# Patient Record
Sex: Female | Born: 1935 | Race: White | Hispanic: No | Smoking: Former smoker
Health system: Southern US, Community
[De-identification: ages and names within clinical notes are randomized; demographics above are authoritative.]

## PROBLEM LIST (undated history)

## (undated) DIAGNOSIS — I10 Essential (primary) hypertension: Secondary | ICD-10-CM

## (undated) DIAGNOSIS — M353 Polymyalgia rheumatica: Secondary | ICD-10-CM

## (undated) DIAGNOSIS — E079 Disorder of thyroid, unspecified: Secondary | ICD-10-CM

## (undated) DIAGNOSIS — Z8742 Personal history of other diseases of the female genital tract: Secondary | ICD-10-CM

## (undated) DIAGNOSIS — S065X9A Traumatic subdural hemorrhage with loss of consciousness of unspecified duration, initial encounter: Secondary | ICD-10-CM

## (undated) DIAGNOSIS — N189 Chronic kidney disease, unspecified: Secondary | ICD-10-CM

## (undated) DIAGNOSIS — K56609 Unspecified intestinal obstruction, unspecified as to partial versus complete obstruction: Secondary | ICD-10-CM

## (undated) DIAGNOSIS — H9190 Unspecified hearing loss, unspecified ear: Secondary | ICD-10-CM

## (undated) DIAGNOSIS — F329 Major depressive disorder, single episode, unspecified: Secondary | ICD-10-CM

## (undated) DIAGNOSIS — A048 Other specified bacterial intestinal infections: Secondary | ICD-10-CM

## (undated) DIAGNOSIS — H5440 Blindness, one eye, unspecified eye: Secondary | ICD-10-CM

## (undated) DIAGNOSIS — Z8711 Personal history of peptic ulcer disease: Secondary | ICD-10-CM

## (undated) DIAGNOSIS — N2889 Other specified disorders of kidney and ureter: Secondary | ICD-10-CM

## (undated) DIAGNOSIS — I779 Disorder of arteries and arterioles, unspecified: Secondary | ICD-10-CM

## (undated) DIAGNOSIS — E785 Hyperlipidemia, unspecified: Secondary | ICD-10-CM

## (undated) DIAGNOSIS — I739 Peripheral vascular disease, unspecified: Secondary | ICD-10-CM

## (undated) DIAGNOSIS — Z972 Presence of dental prosthetic device (complete) (partial): Secondary | ICD-10-CM

## (undated) DIAGNOSIS — F607 Dependent personality disorder: Secondary | ICD-10-CM

## (undated) DIAGNOSIS — IMO0002 Reserved for concepts with insufficient information to code with codable children: Secondary | ICD-10-CM

## (undated) DIAGNOSIS — F32A Depression, unspecified: Secondary | ICD-10-CM

## (undated) DIAGNOSIS — K469 Unspecified abdominal hernia without obstruction or gangrene: Secondary | ICD-10-CM

## (undated) DIAGNOSIS — K219 Gastro-esophageal reflux disease without esophagitis: Secondary | ICD-10-CM

## (undated) DIAGNOSIS — D649 Anemia, unspecified: Secondary | ICD-10-CM

## (undated) DIAGNOSIS — N319 Neuromuscular dysfunction of bladder, unspecified: Secondary | ICD-10-CM

## (undated) DIAGNOSIS — J449 Chronic obstructive pulmonary disease, unspecified: Secondary | ICD-10-CM

## (undated) DIAGNOSIS — K317 Polyp of stomach and duodenum: Secondary | ICD-10-CM

## (undated) DIAGNOSIS — K08109 Complete loss of teeth, unspecified cause, unspecified class: Secondary | ICD-10-CM

## (undated) DIAGNOSIS — S065XAA Traumatic subdural hemorrhage with loss of consciousness status unknown, initial encounter: Secondary | ICD-10-CM

## (undated) DIAGNOSIS — K635 Polyp of colon: Secondary | ICD-10-CM

## (undated) DIAGNOSIS — Z973 Presence of spectacles and contact lenses: Secondary | ICD-10-CM

## (undated) DIAGNOSIS — G894 Chronic pain syndrome: Secondary | ICD-10-CM

## (undated) DIAGNOSIS — K760 Fatty (change of) liver, not elsewhere classified: Secondary | ICD-10-CM

## (undated) DIAGNOSIS — K8050 Calculus of bile duct without cholangitis or cholecystitis without obstruction: Secondary | ICD-10-CM

## (undated) DIAGNOSIS — Z8679 Personal history of other diseases of the circulatory system: Secondary | ICD-10-CM

## (undated) DIAGNOSIS — Z8719 Personal history of other diseases of the digestive system: Secondary | ICD-10-CM

## (undated) DIAGNOSIS — I4891 Unspecified atrial fibrillation: Secondary | ICD-10-CM

## (undated) DIAGNOSIS — E669 Obesity, unspecified: Secondary | ICD-10-CM

## (undated) DIAGNOSIS — G43909 Migraine, unspecified, not intractable, without status migrainosus: Secondary | ICD-10-CM

## (undated) HISTORY — DX: Personal history of other diseases of the circulatory system: Z86.79

## (undated) HISTORY — DX: Dependent personality disorder: F60.7

## (undated) HISTORY — DX: Chronic obstructive pulmonary disease, unspecified: J44.9

## (undated) HISTORY — DX: Other specified bacterial intestinal infections: A04.8

## (undated) HISTORY — DX: Chronic pain syndrome: G89.4

## (undated) HISTORY — PX: BLADDER SUSPENSION: SHX72

## (undated) HISTORY — DX: Calculus of bile duct without cholangitis or cholecystitis without obstruction: K80.50

## (undated) HISTORY — DX: Personal history of other diseases of the digestive system: Z87.19

## (undated) HISTORY — DX: Polymyalgia rheumatica: M35.3

## (undated) HISTORY — DX: Essential (primary) hypertension: I10

## (undated) HISTORY — DX: Anemia, unspecified: D64.9

## (undated) HISTORY — DX: Fatty (change of) liver, not elsewhere classified: K76.0

## (undated) HISTORY — DX: Other specified disorders of kidney and ureter: N28.89

## (undated) HISTORY — PX: SPINAL FUSION: SHX223

## (undated) HISTORY — DX: Disorder of arteries and arterioles, unspecified: I77.9

## (undated) HISTORY — DX: Peripheral vascular disease, unspecified: I73.9

## (undated) HISTORY — DX: Chronic kidney disease, unspecified: N18.9

## (undated) HISTORY — PX: LAMINECTOMY: SHX219

## (undated) HISTORY — DX: Major depressive disorder, single episode, unspecified: F32.9

## (undated) HISTORY — PX: DILATION AND CURETTAGE OF UTERUS: SHX78

## (undated) HISTORY — DX: Traumatic subdural hemorrhage with loss of consciousness of unspecified duration, initial encounter: S06.5X9A

## (undated) HISTORY — DX: Reserved for concepts with insufficient information to code with codable children: IMO0002

## (undated) HISTORY — DX: Personal history of other diseases of the female genital tract: Z87.42

## (undated) HISTORY — DX: Migraine, unspecified, not intractable, without status migrainosus: G43.909

## (undated) HISTORY — DX: Unspecified atrial fibrillation: I48.91

## (undated) HISTORY — DX: Traumatic subdural hemorrhage with loss of consciousness status unknown, initial encounter: S06.5XAA

## (undated) HISTORY — DX: Unspecified abdominal hernia without obstruction or gangrene: K46.9

## (undated) HISTORY — DX: Polyp of stomach and duodenum: K31.7

## (undated) HISTORY — DX: Disorder of thyroid, unspecified: E07.9

## (undated) HISTORY — DX: Obesity, unspecified: E66.9

## (undated) HISTORY — DX: Depression, unspecified: F32.A

## (undated) HISTORY — DX: Personal history of peptic ulcer disease: Z87.11

## (undated) HISTORY — DX: Polyp of colon: K63.5

## (undated) HISTORY — PX: ABDOMINAL HERNIA REPAIR: SHX539

## (undated) HISTORY — DX: Hyperlipidemia, unspecified: E78.5

## (undated) HISTORY — DX: Gastro-esophageal reflux disease without esophagitis: K21.9

## (undated) HISTORY — DX: Unspecified intestinal obstruction, unspecified as to partial versus complete obstruction: K56.609

## (undated) HISTORY — DX: Neuromuscular dysfunction of bladder, unspecified: N31.9

---
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
## 1957-02-24 DIAGNOSIS — E0500 Thyrotoxicosis with diffuse goiter without thyrotoxic crisis or storm: Secondary | ICD-10-CM | POA: Insufficient documentation

## 1957-02-24 DIAGNOSIS — E039 Hypothyroidism, unspecified: Secondary | ICD-10-CM

## 1957-02-24 HISTORY — PX: OTHER SURGICAL HISTORY: SHX169

## 1958-02-24 HISTORY — PX: PARTIAL HYSTERECTOMY: SHX80

## 1959-02-25 DIAGNOSIS — Z8711 Personal history of peptic ulcer disease: Secondary | ICD-10-CM | POA: Insufficient documentation

## 1962-02-24 HISTORY — PX: TOTAL ABDOMINAL HYSTERECTOMY: SHX209

## 1971-02-25 HISTORY — PX: CHOLECYSTECTOMY: SHX55

## 1972-02-25 HISTORY — PX: CARPAL TUNNEL RELEASE: SHX101

## 1981-02-24 DIAGNOSIS — G5600 Carpal tunnel syndrome, unspecified upper limb: Secondary | ICD-10-CM | POA: Insufficient documentation

## 1982-02-24 DIAGNOSIS — I10 Essential (primary) hypertension: Secondary | ICD-10-CM | POA: Insufficient documentation

## 1983-02-25 DIAGNOSIS — N319 Neuromuscular dysfunction of bladder, unspecified: Secondary | ICD-10-CM | POA: Insufficient documentation

## 1986-02-24 DIAGNOSIS — J439 Emphysema, unspecified: Secondary | ICD-10-CM

## 1987-02-25 DIAGNOSIS — E1165 Type 2 diabetes mellitus with hyperglycemia: Secondary | ICD-10-CM

## 1988-02-25 HISTORY — PX: OVARIAN CYST REMOVAL: SHX89

## 1992-02-25 HISTORY — PX: ABDOMINAL WOUND DEHISCENCE: SHX540

## 1993-02-24 DIAGNOSIS — G894 Chronic pain syndrome: Secondary | ICD-10-CM | POA: Insufficient documentation

## 1994-02-24 DIAGNOSIS — H269 Unspecified cataract: Secondary | ICD-10-CM | POA: Insufficient documentation

## 1994-11-25 DIAGNOSIS — E785 Hyperlipidemia, unspecified: Secondary | ICD-10-CM

## 1994-11-25 HISTORY — DX: Hyperlipidemia, unspecified: E78.5

## 1995-02-25 DIAGNOSIS — G4730 Sleep apnea, unspecified: Secondary | ICD-10-CM | POA: Insufficient documentation

## 1997-04-24 DIAGNOSIS — I4891 Unspecified atrial fibrillation: Secondary | ICD-10-CM | POA: Insufficient documentation

## 1997-06-01 HISTORY — PX: DOBUTAMINE STRESS ECHO: SHX5426

## 1998-09-25 ENCOUNTER — Encounter: Payer: Self-pay | Admitting: Family Medicine

## 1998-09-25 LAB — CONVERTED CEMR LAB: Microalbumin U total vol: 26.7 mg/L

## 1999-04-25 ENCOUNTER — Encounter: Payer: Self-pay | Admitting: Family Medicine

## 1999-04-25 LAB — CONVERTED CEMR LAB: Microalbumin U total vol: 18.8 mg/L

## 2000-01-25 LAB — FECAL OCCULT BLOOD, GUAIAC: Fecal Occult Blood: NEGATIVE

## 2000-02-19 ENCOUNTER — Encounter: Payer: Self-pay | Admitting: Emergency Medicine

## 2000-02-19 ENCOUNTER — Emergency Department (HOSPITAL_COMMUNITY): Admission: EM | Admit: 2000-02-19 | Discharge: 2000-02-19 | Payer: Self-pay | Admitting: Emergency Medicine

## 2000-02-25 DIAGNOSIS — K760 Fatty (change of) liver, not elsewhere classified: Secondary | ICD-10-CM

## 2000-02-25 HISTORY — DX: Fatty (change of) liver, not elsewhere classified: K76.0

## 2000-04-01 ENCOUNTER — Inpatient Hospital Stay (HOSPITAL_COMMUNITY): Admission: EM | Admit: 2000-04-01 | Discharge: 2000-04-03 | Payer: Self-pay | Admitting: Internal Medicine

## 2000-04-01 ENCOUNTER — Encounter (INDEPENDENT_AMBULATORY_CARE_PROVIDER_SITE_OTHER): Payer: Self-pay | Admitting: Specialist

## 2000-04-02 ENCOUNTER — Encounter: Payer: Self-pay | Admitting: Internal Medicine

## 2000-04-03 ENCOUNTER — Encounter: Payer: Self-pay | Admitting: Internal Medicine

## 2000-04-03 DIAGNOSIS — K7689 Other specified diseases of liver: Secondary | ICD-10-CM | POA: Insufficient documentation

## 2000-04-05 ENCOUNTER — Encounter: Payer: Self-pay | Admitting: Emergency Medicine

## 2000-04-05 ENCOUNTER — Inpatient Hospital Stay (HOSPITAL_COMMUNITY): Admission: EM | Admit: 2000-04-05 | Discharge: 2000-04-10 | Payer: Self-pay | Admitting: Emergency Medicine

## 2000-12-25 ENCOUNTER — Encounter: Payer: Self-pay | Admitting: Family Medicine

## 2001-03-27 ENCOUNTER — Encounter: Payer: Self-pay | Admitting: Family Medicine

## 2001-04-06 ENCOUNTER — Ambulatory Visit (HOSPITAL_COMMUNITY): Admission: RE | Admit: 2001-04-06 | Discharge: 2001-04-06 | Payer: Self-pay | Admitting: Internal Medicine

## 2002-03-09 ENCOUNTER — Encounter: Payer: Self-pay | Admitting: Oncology

## 2002-03-09 ENCOUNTER — Ambulatory Visit (HOSPITAL_COMMUNITY): Admission: RE | Admit: 2002-03-09 | Discharge: 2002-03-09 | Payer: Self-pay | Admitting: Oncology

## 2002-04-25 ENCOUNTER — Encounter: Payer: Self-pay | Admitting: Family Medicine

## 2002-05-26 ENCOUNTER — Encounter: Payer: Self-pay | Admitting: Family Medicine

## 2002-06-02 ENCOUNTER — Other Ambulatory Visit: Admission: RE | Admit: 2002-06-02 | Discharge: 2002-06-02 | Payer: Self-pay | Admitting: Rheumatology

## 2002-06-02 ENCOUNTER — Encounter (INDEPENDENT_AMBULATORY_CARE_PROVIDER_SITE_OTHER): Payer: Self-pay | Admitting: Specialist

## 2003-01-25 ENCOUNTER — Encounter: Payer: Self-pay | Admitting: Family Medicine

## 2003-03-11 ENCOUNTER — Inpatient Hospital Stay (HOSPITAL_COMMUNITY): Admission: AD | Admit: 2003-03-11 | Discharge: 2003-03-21 | Payer: Self-pay | Admitting: Neurosurgery

## 2003-03-11 ENCOUNTER — Encounter (INDEPENDENT_AMBULATORY_CARE_PROVIDER_SITE_OTHER): Payer: Self-pay | Admitting: *Deleted

## 2003-03-12 HISTORY — PX: CRANIOTOMY: SHX93

## 2003-03-21 ENCOUNTER — Inpatient Hospital Stay (HOSPITAL_COMMUNITY)
Admission: RE | Admit: 2003-03-21 | Discharge: 2003-04-04 | Payer: Self-pay | Admitting: Physical Medicine & Rehabilitation

## 2003-03-28 ENCOUNTER — Encounter: Payer: Self-pay | Admitting: Family Medicine

## 2003-12-26 ENCOUNTER — Ambulatory Visit: Payer: Self-pay | Admitting: *Deleted

## 2004-02-27 ENCOUNTER — Ambulatory Visit: Payer: Self-pay | Admitting: *Deleted

## 2004-03-12 ENCOUNTER — Ambulatory Visit: Payer: Self-pay

## 2004-03-12 ENCOUNTER — Ambulatory Visit: Payer: Self-pay | Admitting: *Deleted

## 2004-03-20 ENCOUNTER — Ambulatory Visit: Payer: Self-pay | Admitting: *Deleted

## 2004-04-03 ENCOUNTER — Ambulatory Visit: Payer: Self-pay | Admitting: Family Medicine

## 2004-06-18 ENCOUNTER — Ambulatory Visit: Payer: Self-pay | Admitting: *Deleted

## 2004-07-23 ENCOUNTER — Ambulatory Visit: Payer: Self-pay | Admitting: Critical Care Medicine

## 2004-08-02 ENCOUNTER — Ambulatory Visit: Payer: Self-pay | Admitting: Family Medicine

## 2004-12-19 ENCOUNTER — Ambulatory Visit: Payer: Self-pay

## 2005-01-03 ENCOUNTER — Ambulatory Visit: Payer: Self-pay

## 2005-03-14 ENCOUNTER — Ambulatory Visit: Payer: Self-pay | Admitting: Family Medicine

## 2005-04-02 ENCOUNTER — Ambulatory Visit: Payer: Self-pay | Admitting: *Deleted

## 2005-04-09 ENCOUNTER — Ambulatory Visit: Payer: Self-pay | Admitting: Family Medicine

## 2005-04-17 ENCOUNTER — Ambulatory Visit: Payer: Self-pay | Admitting: Family Medicine

## 2005-04-23 ENCOUNTER — Ambulatory Visit: Payer: Self-pay | Admitting: Family Medicine

## 2005-04-24 ENCOUNTER — Encounter: Payer: Self-pay | Admitting: Family Medicine

## 2005-04-24 LAB — CONVERTED CEMR LAB
Hgb A1c MFr Bld: 11.1 %
Microalbumin U total vol: 41.6 mg/L

## 2005-05-09 ENCOUNTER — Ambulatory Visit: Payer: Self-pay | Admitting: Family Medicine

## 2005-05-23 ENCOUNTER — Ambulatory Visit: Payer: Self-pay | Admitting: Family Medicine

## 2005-06-03 ENCOUNTER — Ambulatory Visit: Payer: Self-pay | Admitting: Family Medicine

## 2005-06-06 ENCOUNTER — Ambulatory Visit: Payer: Self-pay | Admitting: Family Medicine

## 2005-07-10 ENCOUNTER — Inpatient Hospital Stay: Payer: Self-pay | Admitting: Internal Medicine

## 2005-07-23 ENCOUNTER — Ambulatory Visit: Payer: Self-pay | Admitting: Family Medicine

## 2005-07-25 ENCOUNTER — Encounter: Payer: Self-pay | Admitting: Family Medicine

## 2005-08-22 ENCOUNTER — Ambulatory Visit: Payer: Self-pay | Admitting: Family Medicine

## 2005-10-21 ENCOUNTER — Ambulatory Visit: Payer: Self-pay | Admitting: Family Medicine

## 2005-11-20 ENCOUNTER — Ambulatory Visit: Payer: Self-pay | Admitting: Family Medicine

## 2005-12-10 ENCOUNTER — Ambulatory Visit: Payer: Self-pay | Admitting: Family Medicine

## 2006-01-07 ENCOUNTER — Ambulatory Visit: Payer: Self-pay | Admitting: *Deleted

## 2006-01-07 ENCOUNTER — Ambulatory Visit: Payer: Self-pay

## 2006-01-20 ENCOUNTER — Ambulatory Visit: Payer: Self-pay | Admitting: Family Medicine

## 2006-02-26 ENCOUNTER — Ambulatory Visit: Payer: Self-pay | Admitting: Family Medicine

## 2006-03-19 ENCOUNTER — Ambulatory Visit: Payer: Self-pay

## 2006-04-14 ENCOUNTER — Ambulatory Visit: Payer: Self-pay | Admitting: Family Medicine

## 2006-04-21 ENCOUNTER — Encounter (HOSPITAL_COMMUNITY): Admission: RE | Admit: 2006-04-21 | Discharge: 2006-04-21 | Payer: Self-pay | Admitting: Nephrology

## 2006-05-30 ENCOUNTER — Encounter: Payer: Self-pay | Admitting: Family Medicine

## 2006-06-22 ENCOUNTER — Telehealth: Payer: Self-pay | Admitting: Family Medicine

## 2006-07-02 ENCOUNTER — Encounter: Payer: Self-pay | Admitting: Family Medicine

## 2006-07-03 ENCOUNTER — Ambulatory Visit: Payer: Self-pay | Admitting: Family Medicine

## 2006-07-28 ENCOUNTER — Ambulatory Visit: Payer: Self-pay | Admitting: Family Medicine

## 2006-08-26 ENCOUNTER — Encounter: Payer: Self-pay | Admitting: Family Medicine

## 2006-09-25 ENCOUNTER — Ambulatory Visit: Payer: Self-pay | Admitting: Family Medicine

## 2006-10-21 ENCOUNTER — Encounter: Payer: Self-pay | Admitting: Family Medicine

## 2007-01-12 ENCOUNTER — Ambulatory Visit: Payer: Self-pay

## 2007-01-20 ENCOUNTER — Ambulatory Visit: Payer: Self-pay | Admitting: Cardiology

## 2007-02-03 ENCOUNTER — Encounter: Payer: Self-pay | Admitting: Family Medicine

## 2007-03-15 ENCOUNTER — Telehealth: Payer: Self-pay | Admitting: Family Medicine

## 2007-05-07 ENCOUNTER — Encounter: Payer: Self-pay | Admitting: Family Medicine

## 2007-05-28 ENCOUNTER — Ambulatory Visit: Payer: Self-pay | Admitting: Family Medicine

## 2007-05-28 LAB — CONVERTED CEMR LAB
Bilirubin Urine: NEGATIVE
Blood in Urine, dipstick: NEGATIVE
Protein, U semiquant: NEGATIVE
Specific Gravity, Urine: 1.015

## 2007-06-02 ENCOUNTER — Encounter: Payer: Self-pay | Admitting: Family Medicine

## 2007-06-16 ENCOUNTER — Encounter: Payer: Self-pay | Admitting: Family Medicine

## 2007-06-18 ENCOUNTER — Encounter: Payer: Self-pay | Admitting: Family Medicine

## 2007-06-21 ENCOUNTER — Ambulatory Visit: Payer: Self-pay | Admitting: Family Medicine

## 2007-08-17 ENCOUNTER — Encounter: Payer: Self-pay | Admitting: Family Medicine

## 2007-11-02 ENCOUNTER — Encounter: Payer: Self-pay | Admitting: Family Medicine

## 2007-11-09 ENCOUNTER — Ambulatory Visit: Payer: Self-pay | Admitting: Family Medicine

## 2007-11-18 ENCOUNTER — Encounter: Payer: Self-pay | Admitting: Family Medicine

## 2007-11-26 ENCOUNTER — Telehealth (INDEPENDENT_AMBULATORY_CARE_PROVIDER_SITE_OTHER): Payer: Self-pay | Admitting: Internal Medicine

## 2007-12-02 ENCOUNTER — Ambulatory Visit: Payer: Self-pay | Admitting: Family Medicine

## 2007-12-07 ENCOUNTER — Ambulatory Visit (HOSPITAL_COMMUNITY): Admission: RE | Admit: 2007-12-07 | Discharge: 2007-12-07 | Payer: Self-pay | Admitting: Nephrology

## 2008-02-25 HISTORY — PX: OTHER SURGICAL HISTORY: SHX169

## 2008-03-07 ENCOUNTER — Ambulatory Visit: Payer: Self-pay | Admitting: Cardiology

## 2008-03-17 ENCOUNTER — Encounter: Payer: Self-pay | Admitting: Family Medicine

## 2008-04-05 ENCOUNTER — Ambulatory Visit: Payer: Self-pay | Admitting: Family Medicine

## 2008-04-05 LAB — CONVERTED CEMR LAB
Blood in Urine, dipstick: NEGATIVE
Ketones, urine, test strip: NEGATIVE
Nitrite: NEGATIVE

## 2008-04-06 ENCOUNTER — Encounter: Payer: Self-pay | Admitting: Family Medicine

## 2008-04-11 ENCOUNTER — Telehealth: Payer: Self-pay | Admitting: Family Medicine

## 2008-04-17 ENCOUNTER — Encounter (HOSPITAL_COMMUNITY): Admission: RE | Admit: 2008-04-17 | Discharge: 2008-07-16 | Payer: Self-pay | Admitting: Nephrology

## 2008-04-21 ENCOUNTER — Encounter (INDEPENDENT_AMBULATORY_CARE_PROVIDER_SITE_OTHER): Payer: Self-pay | Admitting: Interventional Radiology

## 2008-04-21 ENCOUNTER — Ambulatory Visit (HOSPITAL_COMMUNITY): Admission: RE | Admit: 2008-04-21 | Discharge: 2008-04-21 | Payer: Self-pay | Admitting: Urology

## 2008-07-10 ENCOUNTER — Telehealth: Payer: Self-pay | Admitting: Family Medicine

## 2008-07-10 ENCOUNTER — Encounter: Payer: Self-pay | Admitting: Family Medicine

## 2008-07-26 ENCOUNTER — Encounter: Admission: RE | Admit: 2008-07-26 | Discharge: 2008-07-26 | Payer: Self-pay | Admitting: Orthopaedic Surgery

## 2008-08-01 ENCOUNTER — Encounter: Payer: Self-pay | Admitting: Family Medicine

## 2008-08-02 ENCOUNTER — Encounter: Payer: Self-pay | Admitting: Family Medicine

## 2008-08-14 ENCOUNTER — Ambulatory Visit: Payer: Self-pay | Admitting: Family Medicine

## 2008-08-14 ENCOUNTER — Telehealth: Payer: Self-pay | Admitting: Family Medicine

## 2008-08-14 DIAGNOSIS — L259 Unspecified contact dermatitis, unspecified cause: Secondary | ICD-10-CM

## 2008-08-14 DIAGNOSIS — E559 Vitamin D deficiency, unspecified: Secondary | ICD-10-CM | POA: Insufficient documentation

## 2008-08-15 DIAGNOSIS — N184 Chronic kidney disease, stage 4 (severe): Secondary | ICD-10-CM

## 2008-09-05 ENCOUNTER — Encounter: Payer: Self-pay | Admitting: Cardiology

## 2008-09-05 ENCOUNTER — Ambulatory Visit: Payer: Self-pay | Admitting: Cardiology

## 2008-10-04 ENCOUNTER — Ambulatory Visit: Payer: Self-pay | Admitting: Family Medicine

## 2008-10-05 LAB — CONVERTED CEMR LAB: Vit D, 25-Hydroxy: 18 ng/mL — ABNORMAL LOW (ref 30–89)

## 2008-10-20 ENCOUNTER — Encounter: Payer: Self-pay | Admitting: Family Medicine

## 2008-10-27 ENCOUNTER — Encounter: Payer: Self-pay | Admitting: Family Medicine

## 2008-11-01 ENCOUNTER — Encounter: Payer: Self-pay | Admitting: Family Medicine

## 2008-11-07 ENCOUNTER — Telehealth: Payer: Self-pay | Admitting: Family Medicine

## 2008-11-15 ENCOUNTER — Ambulatory Visit: Payer: Self-pay | Admitting: Family Medicine

## 2008-12-05 ENCOUNTER — Telehealth: Payer: Self-pay | Admitting: Family Medicine

## 2008-12-19 ENCOUNTER — Ambulatory Visit: Payer: Self-pay | Admitting: Family Medicine

## 2009-02-02 ENCOUNTER — Telehealth: Payer: Self-pay | Admitting: Family Medicine

## 2009-02-09 ENCOUNTER — Telehealth: Payer: Self-pay | Admitting: Family Medicine

## 2009-02-09 ENCOUNTER — Ambulatory Visit: Payer: Self-pay | Admitting: Nephrology

## 2009-02-13 ENCOUNTER — Telehealth: Payer: Self-pay | Admitting: Family Medicine

## 2009-03-01 ENCOUNTER — Encounter: Payer: Self-pay | Admitting: Family Medicine

## 2009-03-01 ENCOUNTER — Ambulatory Visit: Payer: Self-pay

## 2009-03-06 ENCOUNTER — Telehealth: Payer: Self-pay | Admitting: Family Medicine

## 2009-03-08 ENCOUNTER — Telehealth: Payer: Self-pay | Admitting: Family Medicine

## 2009-03-13 ENCOUNTER — Ambulatory Visit: Payer: Self-pay | Admitting: Cardiology

## 2009-03-20 ENCOUNTER — Encounter: Payer: Self-pay | Admitting: Family Medicine

## 2009-04-23 ENCOUNTER — Ambulatory Visit: Payer: Self-pay | Admitting: Family Medicine

## 2009-04-30 ENCOUNTER — Telehealth: Payer: Self-pay | Admitting: Family Medicine

## 2009-05-07 ENCOUNTER — Encounter: Payer: Self-pay | Admitting: Family Medicine

## 2009-05-08 ENCOUNTER — Encounter: Payer: Self-pay | Admitting: Family Medicine

## 2009-05-08 ENCOUNTER — Ambulatory Visit: Payer: Self-pay

## 2009-06-11 ENCOUNTER — Telehealth: Payer: Self-pay | Admitting: Family Medicine

## 2009-06-14 ENCOUNTER — Encounter: Payer: Self-pay | Admitting: Family Medicine

## 2009-06-14 ENCOUNTER — Telehealth: Payer: Self-pay | Admitting: Family Medicine

## 2009-06-21 ENCOUNTER — Telehealth: Payer: Self-pay | Admitting: Family Medicine

## 2009-06-22 ENCOUNTER — Telehealth: Payer: Self-pay | Admitting: Family Medicine

## 2009-07-17 ENCOUNTER — Encounter: Payer: Self-pay | Admitting: Family Medicine

## 2009-07-17 LAB — CONVERTED CEMR LAB
Creatinine, Ser: 1.63 mg/dL
HDL: 30 mg/dL
Hgb A1c MFr Bld: 5.3 %
Triglycerides: 283 mg/dL

## 2009-07-19 ENCOUNTER — Encounter: Payer: Self-pay | Admitting: Family Medicine

## 2009-07-31 ENCOUNTER — Ambulatory Visit: Payer: Self-pay | Admitting: Family Medicine

## 2009-08-17 ENCOUNTER — Telehealth: Payer: Self-pay | Admitting: Family Medicine

## 2009-08-22 ENCOUNTER — Telehealth: Payer: Self-pay | Admitting: Family Medicine

## 2009-09-07 ENCOUNTER — Ambulatory Visit: Payer: Self-pay | Admitting: Family Medicine

## 2009-09-07 DIAGNOSIS — L97909 Non-pressure chronic ulcer of unspecified part of unspecified lower leg with unspecified severity: Secondary | ICD-10-CM | POA: Insufficient documentation

## 2009-09-12 ENCOUNTER — Encounter: Payer: Self-pay | Admitting: Family Medicine

## 2009-09-13 ENCOUNTER — Encounter: Payer: Self-pay | Admitting: Family Medicine

## 2009-09-14 ENCOUNTER — Telehealth: Payer: Self-pay | Admitting: Family Medicine

## 2009-09-25 ENCOUNTER — Encounter: Payer: Self-pay | Admitting: Family Medicine

## 2009-10-02 ENCOUNTER — Encounter (INDEPENDENT_AMBULATORY_CARE_PROVIDER_SITE_OTHER): Payer: Self-pay | Admitting: *Deleted

## 2009-10-16 ENCOUNTER — Encounter (INDEPENDENT_AMBULATORY_CARE_PROVIDER_SITE_OTHER): Payer: Self-pay | Admitting: *Deleted

## 2009-10-23 ENCOUNTER — Telehealth: Payer: Self-pay | Admitting: Family Medicine

## 2009-10-23 ENCOUNTER — Encounter: Payer: Self-pay | Admitting: Family Medicine

## 2009-10-25 ENCOUNTER — Telehealth: Payer: Self-pay | Admitting: Family Medicine

## 2009-11-02 ENCOUNTER — Ambulatory Visit: Payer: Self-pay | Admitting: Family Medicine

## 2009-11-06 LAB — CONVERTED CEMR LAB: Hgb A1c MFr Bld: 5.6 % (ref 4.6–6.5)

## 2010-02-08 ENCOUNTER — Ambulatory Visit: Payer: Self-pay | Admitting: Family Medicine

## 2010-02-08 DIAGNOSIS — M79609 Pain in unspecified limb: Secondary | ICD-10-CM | POA: Insufficient documentation

## 2010-02-08 DIAGNOSIS — R609 Edema, unspecified: Secondary | ICD-10-CM | POA: Insufficient documentation

## 2010-02-08 LAB — HM DIABETES FOOT EXAM

## 2010-02-11 LAB — CONVERTED CEMR LAB
Albumin: 3.3 g/dL — ABNORMAL LOW (ref 3.5–5.2)
Bilirubin, Direct: 0.2 mg/dL (ref 0.0–0.3)
CO2: 37 meq/L — ABNORMAL HIGH (ref 19–32)
Calcium: 9.1 mg/dL (ref 8.4–10.5)
Creatinine, Ser: 1.8 mg/dL — ABNORMAL HIGH (ref 0.4–1.2)
Hgb A1c MFr Bld: 5.4 % (ref 4.6–6.5)
Pro B Natriuretic peptide (BNP): 94.2 pg/mL (ref 0.0–100.0)
Total Protein: 5.9 g/dL — ABNORMAL LOW (ref 6.0–8.3)

## 2010-02-12 ENCOUNTER — Telehealth: Payer: Self-pay | Admitting: Family Medicine

## 2010-02-27 ENCOUNTER — Ambulatory Visit
Admission: RE | Admit: 2010-02-27 | Discharge: 2010-02-27 | Payer: Self-pay | Source: Home / Self Care | Attending: Cardiovascular Disease | Admitting: Cardiovascular Disease

## 2010-02-27 ENCOUNTER — Encounter: Payer: Self-pay | Admitting: Cardiovascular Disease

## 2010-02-27 DIAGNOSIS — I739 Peripheral vascular disease, unspecified: Secondary | ICD-10-CM | POA: Insufficient documentation

## 2010-03-07 ENCOUNTER — Telehealth: Payer: Self-pay | Admitting: Cardiovascular Disease

## 2010-03-12 ENCOUNTER — Encounter: Payer: Self-pay | Admitting: Cardiovascular Disease

## 2010-03-12 ENCOUNTER — Ambulatory Visit: Admission: RE | Admit: 2010-03-12 | Discharge: 2010-03-12 | Payer: Self-pay | Source: Home / Self Care

## 2010-03-15 ENCOUNTER — Telehealth: Payer: Self-pay | Admitting: Cardiovascular Disease

## 2010-03-26 NOTE — Assessment & Plan Note (Signed)
Summary: F8M/AMD  Medications Added ATROVENT HFA 17 MCG/ACT AERS (IPRATROPIUM BROMIDE HFA) Inhale 2 puff using inhaler two times a day LANOXIN 0.125 MG TABS (DIGOXIN) Take 1 tablet by mouth daily LANTUS 100 UNIT/ML SOLN (INSULIN GLARGINE) Inject 90 unit subcutaneously every night LASIX 80 MG TABS (FUROSEMIDE) Take 2 tablets by mouth three times a day CIPROFLOXACIN HCL 500 MG TABS (CIPROFLOXACIN HCL) 1 tab two times a day x 5 days        Visit Type:  Follow-up Primary Provider:  Shaune Leeks MD  CC:  increased SOB x 1 month; retaining fluid all over body x 4 months; occasional chest pain- very stressed lately with family and fluid retention; currently has UTI.  History of Present Illness: Ms [name] returns today for evaluation and management of her chronic atrial fibrillation.  Her biggest complaint is that of increasing lower extremity, even thigh edema. She is followed by Dr. Briant Cedar of the renal service with a most recent creatinine being 1.7 with a BUN in the 40s. She is on 160 mg of furosemide 3 times a day. Looking back through her chart she is no metolazone as well. She is currently not on this.  She denies orthopnea or PND. She's had no palpitations syncope or presyncope.  She is not a Coumadin candidate as outlined in the chart in the past.  She is also plagued with recurrent UTIs requiring Foley catheter intermittently and antibiotics.   Current Medications (verified): 1)  Atrovent Hfa 17 Mcg/act Aers (Ipratropium Bromide Hfa) .... Inhale 2 Puff Using Inhaler Two Times A Day 2)  Lanoxin 0.125 Mg Tabs (Digoxin) .... Take 1 Tablet By Mouth Daily 3)  Lescol Xl 80 Mg Tb24 (Fluvastatin Sodium) .... Take 1 Tablet By Mouth Every Night 4)  Lantus 100 Unit/ml Soln (Insulin Glargine) .... Inject 90 Unit Subcutaneously Every Night 5)  Levothyroxine Sodium 112 Mcg Tabs (Levothyroxine Sodium) .... One Tab By Mouth Once Daily 6)  Lasix 80 Mg Tabs (Furosemide) .... Take 2  Tablets By Mouth Three Times A Day 7)  Actos 45 Mg Tabs (Pioglitazone Hcl) .... One Tab By Mouth Once Daily 8)  Humalog 100 Unit/ml Soln (Insulin Lispro (Human)) .... Inject Unit Subcutaneously As Directed 9)  Calcitriol 0.5 Mcg Caps (Calcitriol) .... Take One By Mouth Daily 10)  Bd Integra Insulin Syringe 29g X 1/2" 1 Ml  Misc (Insulin Syringe-Needle U-100) .Marland Kitchen.. 100  in Box Uses 4 Per Day With Insulin 11)  Bactroban 2 %  Crea (Mupirocin Calcium) .... Apply To Area Two Times A Day Patient Unsure If Needs 12)  Percocet 10-325 Mg Tabs (Oxycodone-Acetaminophen) .... Take 2 By Mouth Four Times A Day With Extra 2 For Flare Ups As Needed 13)  Toprol Xl 100 Mg Xr24h-Tab (Metoprolol Succinate) .Marland Kitchen.. 1 Daily By Mouth 14)  Symbicort 160-4.5 Mcg/act Aero (Budesonide-Formoterol Fumarate) .... 2 Puffs Daily 15)  Triamcinolone Acetonide 0.1 % Crea (Triamcinolone Acetonide) .... Apply To Skin Two Times A Day As Needed Rash 16)  Prilosec 20 Mg Cpdr (Omeprazole) .... One Tab By Mouth 45 Mins Prior To Brfst 17)  Valium 5 Mg Tabs (Diazepam) .... Take One By Mouth 4 Times A Day 18)  Zaroxolyn 2.5 Mg Tabs (Metolazone) .... One Tab By Mouth 1/2 Hr Before Lasix in Am. 19)  Ciprofloxacin Hcl 500 Mg Tabs (Ciprofloxacin Hcl) .Marland Kitchen.. 1 Tab Two Times A Day X 5 Days  Allergies: 1)  ! * Ambien 2)  ! Aspirin 3)  ! Baclofen 4)  ! *  Cardizem Cd 5)  ! Celebrex 6)  ! Codeine 7)  ! * Coumadin 8)  ! Elavil 9)  ! * Klonopin 10)  ! * Ludomil 11)  ! * Methadone 12)  ! Neurontin 13)  ! Phenobarbital 14)  ! Prozac 15)  ! * Sinequan 16)  ! * Spiriva 17)  ! Talwin 18)  ! * Transene 19)  ! Vicodin 20)  ! Vioxx 21)  ! Xanax 22)  ! * Deseyrel 23)  ! Prinivil 24)  ! Levaquin  Past History:  Past Medical History: Last updated: 09/02/2008 Asthma (02/24/1933) COPD (02/24/1986) Diabetes mellitus, type II (02/25/1987) Hypertension (02/24/1982) Hypothyroidism (02/24/1957) Hyperlipidemia (11/25/1994) Atrial fibrillation  (04/24/1997) Subdural hematoma while on coumadin Nonobstructive carotid diease Spinal cord injury with chronic diability and neurogenic bladder Abdominal hernia Colon polyps  Past Surgical History: Last updated: 05/30/2006 Appendectomy w/ ovarian cystectomy  (02/25/1948) Ovarian cystectomy 1958  1960 Partial Hysterectomy 1960 Hysterectomy comlete 1964 Toxic Goiter 1959 Laminectomy L4/L5 1967  1968 Endchondroma rem L Humerus 1963 1964 Bladder tack 1950 1982 Choleycystectomy 1973 Carpal tunnel repair bilat 1974 Spinal Fusion L4/5 L5/S1 1982 1983 1985 Ovarian cyst on bladder 1990 Colon polyps 1992 Abd Hernia repair w/ bowel protrusion  1994 Dehisce of incision 8 mo healing 1994 Rerepair abd hernia  1996 Dobutamine Cardiolite nml EF 80% 06/01/1997 Colonoscopy nml 04/02/2000 EGD 3.80mm  polyps in stomach 04/02/2000 CT scan ABD 2 simple cysts L kidney FATTY LIVER 04/03/2000 ECHO mild AS, Mcalc 05/29/2000 Bone Scan (Levitin) Degen changes lower L?S area 12/15/2000 Colonoscopy polyps 02/2001 Bone Marrow nml 05/2002 [facility] subdural on coumadin 03/11/2003 Cranotomy w/ clot evacuation 03/12/2003 [facility] postop 1/15-1/25/2005 Rehab admiss 1/25-04/04/2003 [facility] hypoglycemia 5/17-5/19/2007 Carotid doppler 0-39 R  40-59 L  01/07/2006  Family History: Last updated: 05/30/2006 Father dec 60 MI,massive Mother dec 76 Ca of colon Afib Brother A&W Brother Morbid obesity Gastric bypass O2COPD Htn MI Sister Htn DM Sister Family History of CAD Female 1st degree relative <50 Family History of Colon CA 1st degree relative <60 Family History Diabetes 1st degree relative Family History Hypertension Family History of Stroke M 1st degree relative <50  Social History: Last updated: 05/30/2006 Med retired [employer] 2 children Retired Divorced Former Smoker  Risk Factors: Caffeine Use: 0 (07/28/2006) Exercise: no (07/28/2006)  Risk Factors: Smoking Status: quit (05/30/2006) Packs/Day: 01/2003 75+pyh  (05/30/2006) Passive Smoke Exposure: no (07/28/2006)  Review of Systems       negative other than history of present illness  Vital Signs:  Patient profile:   77 year old female Pulse rate:   62 / minute Pulse rhythm:   irregular BP sitting:   138 /   (right arm) Cuff size:   large  Vitals Entered By: Charlena Cross, RN, BSN (March 13, 2009 10:55 AM)  Physical Exam  General:  morbidly obese, sitting in a motorized scooter, pleasant, multiple complaints Head:  normocephalic and atraumatic Eyes:  PERRLA/EOM intact; conjunctiva and lids normal. Lungs:  Clear bilaterally to auscultation and percussion. Heart:  irregular rate and rhythm heart rate well controlled, soft systolic murmur, soft carotid bruit on the right which is baseline Msk:  decreased ROM.   Pulses:  difficult to palpate the lower shin is because of the chronic and pitting edema Extremities:  3+ left pedal edema and 3+ right pedal edema.   Neurologic:  Alert and oriented x 3. Skin:  Intact without lesions or rashes. Psych:  depressed affect.     Impression & Recommendations:  Problem #  1:  SWELLING, LIMB BILAT (ICD-729.81) Assessment Unchanged this That is clearly multifactorial including obesity, lack of mobility, chronic venous insufficiency, chronic kidney disease, and perhaps even some pulmonary hypertension. At the present time I have made no recommendations of a change in diuretics. I leave this to Dr. Briant Cedar. For now, rate control for atrial fib as well as blood pressure control are adequate. I do not see an echocardiogram in the chart the last several years. Will repeat just to evaluate RV function and pressures as well as any other undetected abnormality.  Problem # 2:  ATRIAL FIBRILLATION (ICD-427.31) Assessment: Unchanged  Her updated medication list for this problem includes:    Lanoxin 0.125 Mg Tabs (Digoxin) .Marland Kitchen... Take 1 tablet by mouth daily    Toprol Xl 100 Mg Xr24h-tab (Metoprolol  succinate) .Marland Kitchen... 1 daily by mouth  Other Orders: Echocardiogram (Echo)  Patient Instructions: 1)  Your physician recommends that you schedule a follow-up appointment in: 1 year 2)  Your physician has requested that you have an echocardiogram.  Echocardiography is a painless test that uses sound waves to create images of your heart. It provides your doctor with information about the size and shape of your heart and how well your heart's chambers and valves are working.  This procedure takes approximately one hour. There are no restrictions for this procedure. 3)  Your physician recommends that you continue on your current medications as directed. Please refer to the Current Medication list given to you today. Lanoxin 0.125 mg daily Prescriptions: TOPROL XL 100 MG XR24H-TAB (METOPROLOL SUCCINATE) 1 DAILY BY MOUTH  #90 x 3   Entered by:   Charlena Cross, RN, BSN   Authorized by:   Gaylord Shih, MD, [facility]   Signed by:   Charlena Cross, RN, BSN on 03/13/2009   Method used:   Print then Give to Patient   RxID:   [id number]

## 2010-03-26 NOTE — Letter (Signed)
Summary: Dr.Michael Mattingly,[facility]Note  Dr[facility] Mattingly, [facility]   Imported By: Beau Fanny 03/27/2009 15:06:16  _____________________________________________________________________  External Attachment:    Type:   Image     Comment:   External Document

## 2010-03-26 NOTE — Progress Notes (Signed)
Summary: Correct dose?  Phone Note From Pharmacy   Caller: [facility] Call For: Dr. Hetty Ely  Summary of Call: would like to verify that pt's dose of Furosemide is correct.  Form on your desk  Initial call taken by: Mervin Hack CMA Duncan Dull),  April 30, 2009 12:03 PM  Follow-up for Phone Call        Completed. Follow-up by: Shaune Leeks MD,  April 30, 2009 2:01 PM  Additional Follow-up for Phone Call Additional follow up Details #1::        Form faxed back to [facility] as instructed. Additional Follow-up by: Sydell Axon LPN,  April 30, 2009 2:05 PM

## 2010-03-26 NOTE — Assessment & Plan Note (Signed)
Summary: needs to renew rx/ alc   Vital Signs:  Patient profile:   77 year old female Weight:      240.50 pounds Temp:     99.0 degrees F oral Pulse rate:   84 / minute Pulse rhythm:   irregular BP sitting:   120 / 70  (left arm) Cuff size:   large  Vitals Entered By: Sydell Axon LPN (April 23, 2009 12:32 PM) CC: Needs refills on medications sent to [facility]   History of Present Illness: Pt here with her sister for recheck. Her glucose is  runnning 80s to 110s. She has no strength and has had arrhythmia problems but overall is stable. She saw Dr Daleen Squibb middle of Jan and was stable. She is to have an Echo next week to assess structural issues. She saw Dr Melony Overly 2 weeks ago and he actually decreased her Lasix due to renal considerations and sheis on a third dose of Lasix as needed. She fels she still carries a lot of fluid in her lower abd and thighs. Her breathing is baseline. Her glucoses are as good as they have ever been. If anything, she may be slightly overcontrolled. She is on Actos now and tolerating it without difficulty. She is also on Lantus 90 units a day and uses Humalog SS as needed which she has had to use very rarely, once every 2-3 days at most. She needs prescription refills.  Problems Prior to Update: 1)  Dermatitis  (ICD-692.9) 2)  Chronic Kidney Disease Stage Iv (SEVERE)  (ICD-585.4) 3)  Unspecified Vitamin D Deficiency  (ICD-268.9) 4)  Swelling, Limb Bilat  (ICD-729.81) 5)  Ulcer, Chronic, Skin Nec X2 of Back,irritative  (ICD-707.8) 6)  Family History Diabetes 1st Degree Relative  (ICD-V18.0) 7)  Family History of Colon Ca 1st Degree Relative <60  (ICD-V16.0) 8)  Family History of Cad Female 1st Degree Relative <50  (ICD-V17.3) 9)  Fatty Liver Disease  (ICD-571.8) 10)  Syndrome, Chronic Pain  (ICD-338.4) 11)  Polymyalgia Rheumatica w/ Vasculitis  (ICD-725) 12)  Atrial Fibrillation  (ICD-427.31) 13)  Palpitations, Recurrent w/ 6 Beat Svt   (ICD-785.1) 14)  Hyperlipidemia  (ICD-272.4) 15)  Hypothyroidism  (ICD-244.9) 16)  Goiter, Toxic, 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) 17)  Foot Drop, Right S/p Spinal Injection  (ICD-736.79) 18)  Sleep Apnea  (ICD-780.57) 19)  Cataracts, Bilateral  (ICD-366.9) 20)  Effusion, Pleural w/ Mycoplasma Inf  (ICD-511.9) 21)  Carpal Tunnel Syndrome, Bilateral  (ICD-354.0) 22)  Migraine Variant With Visual Disturb  (ICD-346.20) 23)  Rheumatic Fever, Hx of  (ICD-V12.59) 24)  Hypertension  (ICD-401.9) 25)  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 Inhale 2 Puff Using Inhaler Two Times A Day 2)  Lanoxin 0.125 Mg Tabs (Digoxin) .... Take 1 Tablet By Mouth Daily 3)  Lescol Xl 80 Mg Tb24 (Fluvastatin Sodium) .... Take 1 Tablet By Mouth Every Night 4)  Lantus 100 Unit/ml Soln (Insulin Glargine) .... Inject 90 Unit Subcutaneously Every Night 5)  Levothyroxine Sodium 112 Mcg Tabs (Levothyroxine Sodium) .... One Tab By Mouth Once Daily 6)  Lasix 80 Mg Tabs (Furosemide) .... Take 2 Tablets By Mouth Three Times A Day 7)  Actos 45 Mg Tabs (Pioglitazone Hcl) .... One Tab  By Mouth Once Daily 8)  Humalog 100 Unit/ml Soln (Insulin Lispro (Human)) .... Inject Unit Subcutaneously As Directed 9)  Calcitriol 0.5 Mcg Caps (Calcitriol) .... Take One By Mouth Daily 10)  Bd Integra Insulin Syringe 29g X 1/2" 1 Ml  Misc (Insulin Syringe-Needle U-100) .Marland Kitchen.. 100  in Box Uses 4 Per Day With Insulin 11)  Bactroban 2 %  Crea (Mupirocin Calcium) .... Apply To Area Two Times A Day Patient Unsure If Needs 12)  Percocet 10-325 Mg Tabs (Oxycodone-Acetaminophen) .... Take 2 By Mouth Four Times A Day With Extra 2 For Flare Ups As Needed 13)  Toprol Xl 100 Mg Xr24h-Tab (Metoprolol Succinate) .Marland Kitchen.. 1 Daily  By Mouth 14)  Symbicort 160-4.5 Mcg/act Aero (Budesonide-Formoterol Fumarate) .... 2 Puffs Daily 15)  Triamcinolone Acetonide 0.1 % Crea (Triamcinolone Acetonide) .... Apply To Skin Two Times A Day As Needed Rash 16)  Prilosec 20 Mg Cpdr (Omeprazole) .... One Tab By Mouth 45 Mins Prior To Brfst 17)  Valium 5 Mg Tabs (Diazepam) .... Take One By Mouth 4 Times A Day 18)  Zaroxolyn 2.5 Mg Tabs (Metolazone) .... One Tab By Mouth 1/2 Hr Before Lasix in Am. 19)  Ciprofloxacin Hcl 500 Mg Tabs (Ciprofloxacin Hcl) .Marland Kitchen.. 1 Tab Two Times A Day X 5 Days  Allergies: 1)  ! * Ambien 2)  ! Aspirin 3)  ! Baclofen 4)  ! * Cardizem Cd 5)  ! Celebrex 6)  ! Codeine 7)  ! * Coumadin 8)  ! Elavil 9)  ! * Klonopin 10)  ! * Ludomil 11)  ! * Methadone 12)  ! Neurontin 13)  ! Phenobarbital 14)  ! Prozac 15)  ! * Sinequan 16)  ! * Spiriva 17)  ! Talwin 18)  ! * Transene 19)  ! Vicodin 20)  ! Vioxx 21)  ! Xanax 22)  ! * Deseyrel 23)  ! Prinivil 24)  ! Levaquin  Physical Exam  General:  Well-developed,well-nourished,in no acute distress; alert,appropriate and cooperative throughout examination, obese in scooter. Head:  Normocephalic and atraumatic without obvious abnormalities. No apparent alopecia or balding. Sinuses NT. Eyes:  Conjunctiva clear bilaterally.  Ears:  External ear exam shows no significant lesions or deformities.  Otoscopic examination reveals clear canals, tympanic membranes are intact bilaterally without bulging, retraction, inflammation or discharge. Hearing is grossly normal bilaterally. Nose:  External nasal examination shows no deformity or inflammation. Nasal mucosa are pink and moist without lesions or exudates. Mouth:  Oral mucosa and oropharynx without lesions or exudates.  False teeth in good repair but fit poorly. Neck:  No deformities, masses, or tenderness noted. Chest Wall:  No deformities, masses, or tenderness noted. Lungs:  Normal respiratory effort, chest expands  symmetrically. Lungs are clear to auscultation, no crackles or wheezes. Heart:  Normal rate and regular rhythm. S1 and S2 normal without gallop, murmur, click, rub or other extra sounds. Abdomen:  Bowel sounds positive,abdomen soft and non-tender without masses, organomegaly or hernias noted. Slightly bloated today, appears to be fluid.   Impression & Recommendations:  Problem # 1:  DIABETES MELLITUS, TYPE II (ICD-250.00) Assessment Improved  Better than ever. Will decrease Lantus dose 5 units to 85. Cont other meds as is.  Her updated medication list for this problem includes:    Lantus 100 Unit/ml Soln (Insulin glargine) ..... Inject 85 units subcutaneously every night    Actos 45 Mg Tabs (Pioglitazone hcl) ..... One tab by mouth once daily    Humalog 100 Unit/ml Soln (Insulin  lispro (human)) ..... Inject unit subcutaneously as directed  Labs Reviewed: Microalbumin: 41.6 (04/24/2005) Reviewed HgBA1c results: 6.5 (07/25/2005)  11.1 (04/24/2005)  Problem # 2:  CHRONIC KIDNEY DISEASE STAGE IV (SEVERE) (ICD-585.4) Assessment: Deteriorated Per Dr Briant Cedar...has decreased function slightly and he has correspondingly decreased her Lasix dose.  Problem # 3:  SWELLING, LIMB BILAT (ICD-729.81) Assessment: Unchanged Baseline. Diff to assess.  Problem # 4:  ATRIAL FIBRILLATION (ICD-427.31) Assessment: Unchanged Good rate control. Cont curr meds. F/u for Echo and see Dr Daleen Squibb as needed. Her updated medication list for this problem includes:    Lanoxin 0.125 Mg Tabs (Digoxin) .Marland Kitchen... Take 1 tablet by mouth daily    Toprol Xl 100 Mg Xr24h-tab (Metoprolol succinate) .Marland Kitchen... 1 daily by mouth  Problem # 5:  HYPERLIPIDEMIA (ICD-272.4) Assessment: Unchanged Will recheck in June. Her updated medication list for this problem includes:    Lescol Xl 80 Mg Tb24 (Fluvastatin sodium) .Marland Kitchen... Take 1 tablet by mouth every night  Problem # 6:  HYPOTHYROIDISM (ICD-244.9) Assessment: Unchanged Will recheck  in June. Her updated medication list for this problem includes:    Levothyroxine Sodium 112 Mcg Tabs (Levothyroxine sodium) ..... One tab by mouth once daily  Problem # 7:  HYPERTENSION (ICD-401.9) Assessment: Unchanged Stable. Her updated medication list for this problem includes:    Lasix 80 Mg Tabs (Furosemide) .Marland Kitchen... Take 2 tablets by mouth three times a day    Toprol Xl 100 Mg Xr24h-tab (Metoprolol succinate) .Marland Kitchen... 1 daily by mouth    Zaroxolyn 2.5 Mg Tabs (Metolazone) ..... One tab by mouth 1/2 hr before lasix in am.  BP today: 120/70 Prior BP: 138/60 (03/13/2009)  Complete Medication List: 1)  Atrovent Hfa 17 Mcg/act Aers (Ipratropium bromide hfa) .... Inhale 2 puff using inhaler two times a day 2)  Lanoxin 0.125 Mg Tabs (Digoxin) .... Take 1 tablet by mouth daily 3)  Lescol Xl 80 Mg Tb24 (Fluvastatin sodium) .... Take 1 tablet by mouth every night 4)  Lantus 100 Unit/ml Soln (Insulin glargine) .... Inject 85 units subcutaneously every night 5)  Levothyroxine Sodium 112 Mcg Tabs (Levothyroxine sodium) .... One tab by mouth once daily 6)  Lasix 80 Mg Tabs (Furosemide) .... Take 2 tablets by mouth three times a day 7)  Actos 45 Mg Tabs (Pioglitazone hcl) .... One tab by mouth once daily 8)  Humalog 100 Unit/ml Soln (Insulin lispro (human)) .... Inject unit subcutaneously as directed 9)  Calcitriol 0.5 Mcg Caps (Calcitriol) .... Take one by mouth daily 10)  Bd Integra Insulin Syringe 29g X 1/2" 1 Ml Misc (Insulin syringe-needle u-100) .Marland Kitchen.. 100  in box uses 4 per day with insulin 11)  Bactroban 2 % Crea (Mupirocin calcium) .... Apply to area two times a day patient unsure if needs 12)  Percocet 10-325 Mg Tabs (Oxycodone-acetaminophen) .... Take 2 by mouth four times a day with extra 2 for flare ups as needed 13)  Toprol Xl 100 Mg Xr24h-tab (Metoprolol succinate) .Marland Kitchen.. 1 daily by mouth 14)  Symbicort 160-4.5 Mcg/act Aero (Budesonide-formoterol fumarate) .... 2 puffs daily 15)   Triamcinolone Acetonide 0.1 % Crea (Triamcinolone acetonide) .... Apply to skin two times a day as needed rash 16)  Prilosec 20 Mg Cpdr (Omeprazole) .... One tab by mouth 45 mins prior to brfst 17)  Valium 5 Mg Tabs (Diazepam) .... Take one by mouth 4 times a day 18)  Zaroxolyn 2.5 Mg Tabs (Metolazone) .... One tab by mouth 1/2 hr before lasix in am. 19)  Ciprofloxacin Hcl 500 Mg Tabs (Ciprofloxacin hcl) .Marland Kitchen.. 1 tab two times a day x 5 days  Patient Instructions: 1)  Will see pt in June with labs prior. Prescriptions: LASIX 80 MG TABS (FUROSEMIDE) Take 2 tablets by mouth three times a day  #540 x 3   Entered by:   Sydell Axon LPN   Authorized by:   Shaune Leeks MD   Signed by:   Sydell Axon LPN on 76/28/3151   Method used:   Print then Give to Patient   RxID:   [id number] LANOXIN 0.125 MG TABS (DIGOXIN) Take 1 tablet by mouth daily  #90 x 3   Entered by:   Sydell Axon LPN   Authorized by:   Shaune Leeks MD   Signed by:   Sydell Axon LPN on 54/62/7035   Method used:   Print then Give to Patient   RxID:   [id number] ACTOS 45 MG TABS (PIOGLITAZONE HCL) one tab by mouth once daily  #90 x 3   Entered by:   Sydell Axon LPN   Authorized by:   Shaune Leeks MD   Signed by:   Sydell Axon LPN on 78/93/8101   Method used:   Print then Give to Patient   RxID:   [id number] SYMBICORT 160-4.5 MCG/ACT AERO (BUDESONIDE-FORMOTEROL FUMARATE) 2 PUFFS DAILY  #3MDI x 3   Entered by:   Sydell Axon LPN   Authorized by:   Shaune Leeks MD   Signed by:   Sydell Axon LPN on 53/61/4431   Method used:   Print then Give to Patient   RxID:   [id number] LESCOL XL 80 MG TB24 (FLUVASTATIN SODIUM) Take 1 tablet by mouth every night  #90 x 3   Entered by:   Sydell Axon LPN   Authorized by:   Shaune Leeks MD   Signed by:   Sydell Axon LPN on 71/24/5809   Method used:   Print then Give to Patient   RxID:   [id number]   Current Allergies (reviewed  today): ! Remus Loffler ! ASPIRIN ! BACLOFEN ! * CARDIZEM CD ! CELEBREX ! CODEINE ! * COUMADIN ! ELAVIL ! * KLONOPIN ! * LUDOMIL ! * METHADONE ! NEURONTIN ! PHENOBARBITAL ! PROZAC ! * SINEQUAN ! * SPIRIVA ! TALWIN ! * TRANSENE ! VICODIN ! VIOXX ! Prudy Feeler ! * DESEYREL ! PRINIVIL ! LEVAQUIN   Tetanus/Td Immunization History:    Tetanus/Td # 1:  Td (02/24/2001)

## 2010-03-26 NOTE — Miscellaneous (Signed)
Summary: Care Update/[facility]  Care Update/[facility]   Imported By: Lanelle Bal 09/19/2009 11:33:04  _____________________________________________________________________  External Attachment:    Type:   Image     Comment:   External Document

## 2010-03-26 NOTE — Letter (Signed)
Summary: Dr.Michael Mattingly,[facility],Note  Dr.Michael Mattingly,[facility],Note   Imported By: Beau Fanny 08/01/2009 16:44:39  _____________________________________________________________________  External Attachment:    Type:   Image     Comment:   External Document

## 2010-03-26 NOTE — Progress Notes (Signed)
Summary: clarification on humolog  Phone Note From Pharmacy   Caller: [facility] Summary of Call: Pharmacy is asking for verification on humolog.  Form is on your shelf. Initial call taken by: Lowella Petties CMA,  June 21, 2009 5:08 PM  Follow-up for Phone Call        Form faxed, either is fine with Dr. Hetty Ely. ( vials or pens) Follow-up by: Lowella Petties CMA,  June 21, 2009 5:14 PM

## 2010-03-26 NOTE — Letter (Signed)
Summary: Nadara Eaton letter  [place] [facility]  [address] [place], [place] [address]   Phone: [phone]  Fax: [phone]       10/02/2009 MRN: [id number]  [facility] [address] [place], [place]  [address]  Dear Ms. Blouch,  [facility] [place], and [place] announce the retirement of [name], M.D., from full-time practice at the [facility] office effective August 23, 2009 and his plans of returning part-time.  It is important to Dr. Hetty Ely and to our practice that you understand that [facility] has seven physicians in our office for your health care needs.  We will continue to offer the same exceptional care that you have today.    Dr. Hetty Ely has spoken to many of you about his plans for retirement and returning part-time in the fall.   We will continue to work with you through the transition to schedule appointments for you in the office and meet the high standards that [place] is committed to.   Again, it is with great pleasure that we share the news that Dr. Hetty Ely will return to [facility] in October of 2011 with a reduced schedule.    If you have any questions, or would like to request an appointment with one of our physicians, please call us at [phone] and press the option for Scheduling an appointment.  We take pleasure in providing you with excellent patient care and look forward to seeing you at your next office visit.  Our [facility] Physicians are:  Tillman Abide, M.D. Laurita Quint, M.D. Roxy Manns, M.D. Kerby Nora, M.D. Hannah Beat, M.D. Ruthe Mannan, M.D. We proudly welcomed Raechel Ache, M.D. and Eustaquio Boyden, M.D. to the practice in July/August 2011.  Sincerely,  [facility]

## 2010-03-26 NOTE — Assessment & Plan Note (Signed)
Summary: 30 MIN F/U PER DR Kivon Aprea/CLE   Vital Signs:  Patient profile:   77 year old female Weight:      250.25 pounds BMI:     43.11 Temp:     98.1 degrees F oral Pulse rate:   76 / minute Pulse rhythm:   irregular BP sitting:   110 / 68  (left arm) Cuff size:   large  Vitals Entered By: Sydell Axon LPN (August 01, 979 11:01 AM) CC: 30 minute follow-up   History of Present Illness: Pt here with her niece for followup. Honestly is feeling pretty good and looking as good as she ever has today here in the office. She has no complaints except for swings in weight presumably from fluid. She has recently seen Dr Briant Cedar and given a good report with increased kidney function altho it is still decreased. She has also recently seen Dr Daleen Squibb and given a good report.  Problems Prior to Update: 1)  Dermatitis  (ICD-692.9) 2)  Chronic Kidney Disease Stage Iv (SEVERE)  (ICD-585.4) 3)  Unspecified Vitamin D Deficiency  (ICD-268.9) 4)  Swelling, Limb Bilat  (ICD-729.81) 5)  Ulcer, Chronic, Skin Nec X2 of Back,irritative  (ICD-707.8) 6)  Family History Diabetes 1st Degree Relative  (ICD-V18.0) 7)  Family History of Colon Ca 1st Degree Relative <60  (ICD-V16.0) 8)  Family History of Cad Female 1st Degree Relative <50  (ICD-V17.3) 9)  Fatty Liver Disease  (ICD-571.8) 10)  Syndrome, Chronic Pain  (ICD-338.4) 11)  Polymyalgia Rheumatica w/ Vasculitis  (ICD-725) 12)  Atrial Fibrillation  (ICD-427.31) 13)  Palpitations, Recurrent w/ 6 Beat Svt  (ICD-785.1) 14)  Hyperlipidemia  (ICD-272.4) 15)  Hypothyroidism  (ICD-244.9) 16)  Goiter, Toxic, 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) 17)  Foot Drop, Right S/p Spinal Injection  (ICD-736.79) 18)  Sleep Apnea  (ICD-780.57) 19)  Cataracts, Bilateral  (ICD-366.9) 20)  Effusion, Pleural w/ Mycoplasma Inf  (ICD-511.9) 21)  Carpal Tunnel Syndrome, Bilateral  (ICD-354.0) 22)  Migraine Variant With Visual Disturb  (ICD-346.20) 23)  Rheumatic Fever, Hx of  (ICD-V12.59) 24)   Hypertension  (ICD-401.9) 25)  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 Inhale 2 Puff Using Inhaler Two Times A Day 2)  Lanoxin 0.125 Mg Tabs (Digoxin) .... Take 1 Tablet By Mouth Daily 3)  Lescol Xl 80 Mg Tb24 (Fluvastatin Sodium) .... Take 1 Tablet By Mouth Every Night 4)  Lantus 100 Unit/ml Soln (Insulin Glargine) .... Inject 85 Units Subcutaneously Every Night 5)  Levothyroxine Sodium 112 Mcg Tabs (Levothyroxine Sodium) .... One Tab By Mouth Once Daily 6)  Lasix 80 Mg Tabs (Furosemide) .... Take 2 Tablets By Mouth Three Times A Day 7)  Actos 45 Mg Tabs (Pioglitazone Hcl) .... One Tab By Mouth Once Daily 8)  Humalog 100 Unit/ml Soln (Insulin Lispro (Human)) .... Inject Dose  Per Sliding Scale Subcutaneously As Directed 9)  Calcitriol 0.5 Mcg Caps (Calcitriol) .... Take One By Mouth Daily 10)  Bd Integra Insulin Syringe 29g X 1/2" 1 Ml  Misc (Insulin Syringe-Needle U-100) .Marland Kitchen.. 100  in Box Uses 4 Per Day With Insulin 11)  Bactroban 2 %  Crea (Mupirocin Calcium) .... Apply To Area Two Times A Day Patient Unsure If Needs  12)  Percocet 10-325 Mg Tabs (Oxycodone-Acetaminophen) .... Take 2 By Mouth Four Times A Day With Extra 2 For Flare Ups As Needed 13)  Toprol Xl 100 Mg Xr24h-Tab (Metoprolol Succinate) .Marland Kitchen.. 1 Daily By Mouth 14)  Symbicort 160-4.5 Mcg/act Aero (Budesonide-Formoterol Fumarate) .... 2 Puffs Daily 15)  Triamcinolone Acetonide 0.1 % Crea (Triamcinolone Acetonide) .... Apply To Skin Two Times A Day As Needed Rash 16)  Prilosec 20 Mg Cpdr (Omeprazole) .... One Tab By Mouth 45 Mins Prior To Brfst 17)  Valium 5 Mg Tabs (Diazepam) .... Take One By Mouth 4 Times A Day 18)  Zaroxolyn 2.5 Mg Tabs (Metolazone) .... One Tab By Mouth 1/2 Hr Before  Lasix in Am. 19)  Ciprofloxacin Hcl 500 Mg Tabs (Ciprofloxacin Hcl) .Marland Kitchen.. 1 Tab Two Times A Day X 5 Days  Allergies: 1)  ! * Ambien 2)  ! Aspirin 3)  ! Baclofen 4)  ! * Cardizem Cd 5)  ! Celebrex 6)  ! Codeine 7)  ! * Coumadin 8)  ! Elavil 9)  ! * Klonopin 10)  ! * Ludomil 11)  ! * Methadone 12)  ! Neurontin 13)  ! Phenobarbital 14)  ! Prozac 15)  ! * Sinequan 16)  ! * Spiriva 17)  ! Talwin 18)  ! * Transene 19)  ! Vicodin 20)  ! Vioxx 21)  ! Xanax 22)  ! * Deseyrel 23)  ! Prinivil 24)  ! Levaquin  Physical Exam  General:  Well-developed,well-nourished,in no acute distress; alert,appropriate and cooperative throughout examination, obese in scooter. Head:  Normocephalic and atraumatic without obvious abnormalities. No apparent alopecia or balding. Sinuses NT. Eyes:  Conjunctiva clear bilaterally.  Ears:  External ear exam shows no significant lesions or deformities.  Otoscopic examination reveals clear canals, tympanic membranes are intact bilaterally without bulging, retraction, inflammation or discharge. Hearing is grossly normal bilaterally. Nose:  External nasal examination shows no deformity or inflammation. Nasal mucosa are pink and moist without lesions or exudates. Mouth:  Oral mucosa and oropharynx without lesions or exudates.  False teeth in good repair but fit poorly, better than usual today.. Neck:  No deformities, masses, or tenderness noted. Chest Wall:  No deformities, masses, or tenderness noted. Lungs:  Normal respiratory effort, chest expands symmetrically. Lungs are clear to auscultation, no crackles or wheezes. Heart:  Normal rate and regular rhythm. S1 and S2 normal without gallop, murmur, click, rub or other extra sounds. Abdomen:  Bowel sounds positive,abdomen soft and non-tender without masses, organomegaly or hernias noted. Slightly bloated today, appears to be fluid. Extremities:  1-2+ edema of the thighs, less in the lower legs.  Diabetes  Management Exam:    Foot Exam (with socks and/or shoes not present):       Sensory-Pinprick/Light touch:          Left medial foot (L-4): diminished          Left dorsal foot (L-5): diminished          Left lateral foot (S-1): diminished          Right medial foot (L-4): diminished          Right dorsal foot (L-5): diminished          Right lateral foot (S-1): diminished       Sensory-Monofilament:          Left foot: diminished          Right foot: diminished       Inspection:  Left foot: normal          Right foot: normal       Nails:          Left foot: thickened          Right foot: thickened   Impression & Recommendations:  Problem # 1:  DIABETES MELLITUS, TYPE II (ICD-250.00) Assessment Improved  Great nos. Contr curr meds at curr doses. She is using SS on average about once a day. She feels good. Her updated medication list for this problem includes:    Lantus 100 Unit/ml Soln (Insulin glargine) ..... Inject 85 units subcutaneously every night    Actos 45 Mg Tabs (Pioglitazone hcl) ..... One tab by mouth once daily    Humalog 100 Unit/ml Soln (Insulin lispro (human)) ..... Inject dose  per sliding scale subcutaneously as directed  Labs Reviewed: Microalbumin: 41.6 (04/24/2005) Reviewed HgBA1c results: 6.5 (07/25/2005)  A1C 5.3 (07/17/09)  11.1 (04/24/2005)  Problem # 2:  DERMATITIS (ICD-692.9) Assessment: Improved  Cont mesds as prescribed. Her updated medication list for this problem includes:    Triamcinolone Acetonide 0.1 % Crea (Triamcinolone acetonide) .Marland Kitchen... Apply to skin two times a day as needed rash    Promethazine Hcl 25 Mg Tabs (Promethazine hcl) .Marland Kitchen... As needed  Problem # 3:  CHRONIC KIDNEY DISEASE STAGE IV (SEVERE) (ICD-585.4) Assessment: Improved  Doing better. Cont curr regimen. Cont f/u with Dr Briant Cedar.  Labs Reviewed: BUN: 37 (07/31/2009)   Cr: 1.63 (07/17/2009)     Problem # 4:  SWELLING, LIMB BILAT (ICD-729.81) Assessment:  Unchanged Axctually slightly better today on decreased meds. Encouraged to use muscles of arma and legs to preclude accumulation.  Problem # 5:  HYPOTHYROIDISM (ICD-244.9) Assessment: Improved Euthyroid witrh TSH 2.49 and T4Fr 1.58. Cont curr dose. Her updated medication list for this problem includes:    Levothyroxine Sodium 112 Mcg Tabs (Levothyroxine sodium) ..... One tab by mouth once daily  Problem # 6:  HYPERLIPIDEMIA (ICD-272.4) Assessment: Unchanged Trigs high but better, HDL slightly low. Discussed adding Niaspan but she prefers not. Will follow. Her updated medication list for this problem includes:    Lescol Xl 80 Mg Tb24 (Fluvastatin sodium) .Marland Kitchen... Take 1 tablet by mouth every night    HDL:30 (07/17/2009)  LDL:106 (07/17/2009)  Chol:193 (07/17/2009)  Trig:283 (07/17/2009)  Problem # 7:  HYPERTENSION (ICD-401.9) Assessment: Improved Better today still. Doing great.  Her updated medication list for this problem includes:    Lasix 80 Mg Tabs (Furosemide) .Marland Kitchen... Take 2 tablets by mouth three times a day    Toprol Xl 100 Mg Xr24h-tab (Metoprolol succinate) .Marland Kitchen... 1 daily by mouth    Zaroxolyn 2.5 Mg Tabs (Metolazone) ..... One tab by mouth 1/2 hr before lasix in am.  BP today: 110/68 Prior BP: 120/70 (04/23/2009)  Labs Reviewed: Creat: : 1.63 (07/17/2009)   Chol: 193 (07/17/2009)   HDL: 30 (07/17/2009)   LDL: 106 (07/17/2009)   TG: 283 (07/17/2009)  Problem # 8:  FATTY LIVER DISEASE (ICD-571.8) Assessment: Improved SGOT 14   SGPT 14. Cont efforts.  Complete Medication List: 1)  Atrovent Hfa 17 Mcg/act Aers (Ipratropium bromide hfa) .... Inhale 2 puff using inhaler two times a day 2)  Lanoxin 0.125 Mg Tabs (Digoxin) .... Take 1 tablet by mouth daily 3)  Lescol Xl 80 Mg Tb24 (Fluvastatin sodium) .... Take 1 tablet by mouth every night 4)  Lantus 100 Unit/ml Soln (Insulin glargine) .... Inject 85 units subcutaneously every night 5)  Levothyroxine Sodium  112 Mcg Tabs  (Levothyroxine sodium) .... One tab by mouth once daily 6)  Lasix 80 Mg Tabs (Furosemide) .... Take 2 tablets by mouth three times a day 7)  Actos 45 Mg Tabs (Pioglitazone hcl) .... One tab by mouth once daily 8)  Humalog 100 Unit/ml Soln (Insulin lispro (human)) .... Inject dose  per sliding scale subcutaneously as directed 9)  Calcitriol 0.5 Mcg Caps (Calcitriol) .... Take one by mouth daily 10)  Bd Integra Insulin Syringe 29g X 1/2" 1 Ml Misc (Insulin syringe-needle u-100) .Marland Kitchen.. 100  in box uses 4 per day with insulin 11)  Bactroban 2 % Crea (Mupirocin calcium) .... Apply to area two times a day patient unsure if needs 12)  Percocet 10-325 Mg Tabs (Oxycodone-acetaminophen) .... Take 2 by mouth four times a day with extra 2 for flare ups as needed 13)  Toprol Xl 100 Mg Xr24h-tab (Metoprolol succinate) .Marland Kitchen.. 1 daily by mouth 14)  Symbicort 160-4.5 Mcg/act Aero (Budesonide-formoterol fumarate) .... 2 puffs daily 15)  Triamcinolone Acetonide 0.1 % Crea (Triamcinolone acetonide) .... Apply to skin two times a day as needed rash 16)  Prilosec 20 Mg Cpdr (Omeprazole) .... One tab by mouth 45 mins prior to brfst 17)  Valium 5 Mg Tabs (Diazepam) .... Take one by mouth 4 times a day 18)  Zaroxolyn 2.5 Mg Tabs (Metolazone) .... One tab by mouth 1/2 hr before lasix in am. 19)  Ciprofloxacin Hcl 500 Mg Tabs (Ciprofloxacin hcl) .Marland Kitchen.. 1 tab two times a day x 5 days 20)  Miralax Powd (Polyethylene glycol 3350) .... Daily 21)  Promethazine Hcl 25 Mg Tabs (Promethazine hcl) .... As needed  Patient Instructions: 1)  Needs eye exam.  2)  Make appt in 3 mos for recheck. 3)  Bring med bottles to next visit.  Current Allergies (reviewed today): ! * AMBIEN ! ASPIRIN ! BACLOFEN ! * CARDIZEM CD ! CELEBREX ! CODEINE ! * COUMADIN ! ELAVIL ! * KLONOPIN ! * LUDOMIL ! * METHADONE ! NEURONTIN ! PHENOBARBITAL ! PROZAC ! * SINEQUAN ! * SPIRIVA ! TALWIN ! * TRANSENE ! VICODIN ! VIOXX ! Prudy Feeler ! *  DESEYREL ! PRINIVIL ! LEVAQUIN

## 2010-03-26 NOTE — Progress Notes (Signed)
Summary: form for diabetic supplies  Phone Note From Pharmacy   Caller: [facility] Medical supply Summary of Call: Form for diabetic supplies is on your shelf.  Pt says she has been dealing with this company for years. Initial call taken by: Lowella Petties CMA,  August 17, 2009 12:54 PM  Follow-up for Phone Call        Signed. Follow-up by: Shaune Leeks MD,  August 20, 2009 7:20 AM  Additional Follow-up for Phone Call Additional follow up Details #1::        Order faxed to [facility]. Additional Follow-up by: Sydell Axon LPN,  August 20, 2009 9:45 AM

## 2010-03-26 NOTE — Miscellaneous (Signed)
Summary: SN Orders/[facility]  SN Orders/[facility]   Imported By: Lanelle Bal 09/19/2009 12:55:54  _____________________________________________________________________  External Attachment:    Type:   Image     Comment:   External Document

## 2010-03-26 NOTE — Progress Notes (Signed)
Summary: regarding urine culture  Phone Note Call from Patient   Caller: Patient Summary of Call: Advised pt of urine cx report.  She said she is having some cramping when she urinates.  She said this is an old problem that Dr. Hetty Ely is well aware of.  She said this should have gone to her urologist.  She asked if we could call them and give them the results.  I told her I would fax them a copy and that she should follow up with them for treatment.  Copy faxed to Dr. Jacquelyne Balint. Initial call taken by: Lowella Petties CMA,  March 06, 2009 11:12 AM  Follow-up for Phone Call        thanks- let me know if she has any trouble getting through to her urologist today - because she does need abx for this infection   Follow-up by: Judith Part MD,  March 06, 2009 11:15 AM  Additional Follow-up for Phone Call Additional follow up Details #1::        Actually, I just called that office to get the fax number and they are closed.    Lowella Petties CMA  March 06, 2009 11:16 AM Pt uses Burnetta Sabin is something is called in.--  this needs to go on [employer].  Pt also wants this delivered.  ( I am leaving the office now).  Lowella Petties CMA  March 06, 2009 11:20 AM     Additional Follow-up for Phone Call Additional follow up Details #2::    will tx with levaquin  px written on EMR for call in  will need to still f/u with her urologist after this  Follow-up by: Judith Part MD,  March 06, 2009 11:27 AM  Additional Follow-up for Phone Call Additional follow up Details #3:: Details for Additional Follow-up Action Taken: Patient notified as instructed by telephone. RX called to pharmacy. Sydell Axon LPN  March 06, 2009 12:10 PM   New/Updated Medications: LEVAQUIN 500 MG TABS (LEVOFLOXACIN) 1 by mouth once daily for 7 days Prescriptions: LEVAQUIN 500 MG TABS (LEVOFLOXACIN) 1 by mouth once daily for 7 days  #7 x 0   Entered by:   Sydell Axon LPN   Authorized by:   Judith Part MD   Signed by:   Sydell Axon LPN on 04/54/0981   Method used:   Electronically to        [facility] [phone]* (retail)       [address] [place], [place]  [address]       Ph: [phone]       Fax: [phone]   RxID:   [id number]

## 2010-03-26 NOTE — Miscellaneous (Signed)
Summary: med list update syringes  Medications Added BD INSULIN SYRINGE ULTRAFINE 30G X 1/2" 1 ML MISC (INSULIN SYRINGE-NEEDLE U-100)        Clinical Lists Changes  Medications: Changed medication from BD [employer]TEGRA INSULIN SYRINGE 29G X 1/2" 1 ML  MISC (INSULIN SYRINGE-NEEDLE U-100) 100  IN BOX USES 4 PER DAY WITH INSULIN to BD INSULIN SYRINGE ULTRAFINE 30G X 1/2" 1 ML MISC (INSULIN SYRINGE-NEEDLE U-100)     Prior Medications: ATROVENT HFA 17 MCG/ACT AERS (IPRATROPIUM BROMIDE HFA) Inhale 2 puff using inhaler two times a day LANOXIN 0.125 MG TABS (DIGOXIN) Take 1 tablet by mouth daily LESCOL XL 80 MG TB24 (FLUVASTATIN SODIUM) Take 1 tablet by mouth every night LANTUS 100 UNIT/ML SOLN (INSULIN GLARGINE) Inject 85 units subcutaneously every night LEVOTHYROXINE SODIUM 112 MCG TABS (LEVOTHYROXINE SODIUM) one tab by mouth once daily LASIX 80 MG TABS (FUROSEMIDE) Take 2 tablets by mouth two  times a day ACTOS 45 MG TABS (PIOGLITAZONE HCL) one tab by mouth once daily HUMALOG 100 UNIT/ML SOLN (INSULIN LISPRO (HUMAN)) Inject dose  per sliding scale subcutaneously as directed CALCITRIOL 0.5 MCG CAPS (CALCITRIOL) take one by mouth daily BACTROBAN 2 %  CREA (MUPIROCIN CALCIUM) apply to area daily with dressing change PERCOCET 10-325 MG TABS (OXYCODONE-ACETAMINOPHEN) take 2 by mouth four times a day with extra 2 for flare ups as needed TOPROL XL 100 MG XR24H-TAB (METOPROLOL SUCCINATE) 1 DAILY BY MOUTH SYMBICORT 160-4.5 MCG/ACT AERO (BUDESONIDE-FORMOTEROL FUMARATE) 2 PUFFS DAILY TRIAMCINOLONE ACETONIDE 0.1 % CREA (TRIAMCINOLONE ACETONIDE) apply to skin two times a day as needed rash PRILOSEC 20 MG CPDR (OMEPRAZOLE) one tab by mouth 45 mins prior to brfst VALIUM 5 MG TABS (DIAZEPAM) take one by mouth 4 times a day ZAROXOLYN 2.5 MG TABS (METOLAZONE) one tab by mouth 1/2 hr before Lasix in AM. CIPROFLOXACIN HCL 500 MG TABS (CIPROFLOXACIN HCL) 1 tab two times a day x 5 days MIRALAX  POWD (POLYETHYLENE  GLYCOL 3350) Daily PROMETHAZINE HCL 25 MG TABS (PROMETHAZINE HCL) as needed MORPHINE SULFATE 15 MG TABS (MORPHINE SULFATE) One or two tablets as needed, added to Percocet. Current Allergies: ! * AMBIEN ! ASPIRIN ! BACLOFEN ! * CARDIZEM CD ! CELEBREX ! CODEINE ! * COUMADIN ! ELAVIL ! * KLONOPIN ! * LUDOMIL ! * METHADONE ! NEURONTIN ! PHENOBARBITAL ! PROZAC ! * SINEQUAN ! * SPIRIVA ! TALWIN ! * TRANSENE ! VICODIN ! VIOXX ! Prudy Feeler ! * DESEYREL ! PRINIVIL ! LEVAQUIN ! Roland Rack BOOT

## 2010-03-26 NOTE — Progress Notes (Signed)
Summary: Prior Authorization Atrovent  Phone Note From Pharmacy   Caller: [facility] Call For: Dr. Hetty Ely  Summary of Call: Received PA Approval for Atrovent HFA.  Effective date 06/14/2009 Expiration date 07/14/2009.  One refill approved.   Initial call taken by: Linde Gillis CMA Duncan Dull),  June 14, 2009 1:59 PM

## 2010-03-26 NOTE — Progress Notes (Signed)
Summary: call a nurse note  Phone Note From Other Clinic   Caller: Call a Nurse Summary of Call: [facility] Triage Call Report Triage Record Num: [id number] Operator: Patriciaann Clan Patient Name: Makiyah Zentz Call Date & Time: 10/24/2009 8:13:41PM Patient Phone: [phone] PCP: Seymour Bars, DO Patient Gender: Female PCP Fax : [phone] Patient DOB: [birth year] Practice Name: [facility] Reason for Call: Patient calling. States her Lantus and Humalog Insulin was unrefrigerated for 6-7 hours. Patient's Pharmacy is closed but she does have original bottle and box with dosing instructions to take to Pharmacy. Spoke with Pharmacist/Jason @ [facility] [employer] on [address] @ [phone]. Advised of above. States both Insulins can still be used. Patient advised of above. Patient advised may use Insulin as prescribed. Advised to keep Insulins refrigerated. Patient verbalizes understanding. Protocol(s) Used: Office Note Recommended Outcome per Protocol: Information Noted and Sent to Office Reason for Outcome: Caller information to office Care Advice:  ~ 10/24/2009 8:27:54PM Page 1 of 1 [email or web address] Initial call taken by: Lowella Petties CMA,  October 25, 2009 8:24 AM  Follow-up for Phone Call        noted.  Follow-up by: Crawford Givens MD,  October 25, 2009 8:26 AM

## 2010-03-26 NOTE — Miscellaneous (Signed)
Summary: med list update  Medications Added BD INSULIN SYRINGE ULTRAFINE 30G X 1/2" 1 ML MISC (INSULIN SYRINGE-NEEDLE U-100) Use 4 times per day w/ insulin       Clinical Lists Changes  Medications: Changed medication from BD INSULIN SYRINGE ULTRAFINE 30G X 1/2" 1 ML MISC (INSULIN SYRINGE-NEEDLE U-100) to BD INSULIN SYRINGE ULTRAFINE 30G X 1/2" 1 ML MISC (INSULIN SYRINGE-NEEDLE U-100) Use 4 times per day w/ insulin     Prior Medications: ATROVENT HFA 17 MCG/ACT AERS (IPRATROPIUM BROMIDE HFA) Inhale 2 puff using inhaler two times a day LANOXIN 0.125 MG TABS (DIGOXIN) Take 1 tablet by mouth daily LESCOL XL 80 MG TB24 (FLUVASTATIN SODIUM) Take 1 tablet by mouth every night LANTUS 100 UNIT/ML SOLN (INSULIN GLARGINE) Inject 85 units subcutaneously every night LEVOTHYROXINE SODIUM 112 MCG TABS (LEVOTHYROXINE SODIUM) one tab by mouth once daily LASIX 80 MG TABS (FUROSEMIDE) Take 2 tablets by mouth two  times a day ACTOS 45 MG TABS (PIOGLITAZONE HCL) one tab by mouth once daily HUMALOG 100 UNIT/ML SOLN (INSULIN LISPRO (HUMAN)) Inject dose  per sliding scale subcutaneously as directed CALCITRIOL 0.5 MCG CAPS (CALCITRIOL) take one by mouth daily BACTROBAN 2 %  CREA (MUPIROCIN CALCIUM) apply to area daily with dressing change PERCOCET 10-325 MG TABS (OXYCODONE-ACETAMINOPHEN) take 2 by mouth four times a day with extra 2 for flare ups as needed TOPROL XL 100 MG XR24H-TAB (METOPROLOL SUCCINATE) 1 DAILY BY MOUTH SYMBICORT 160-4.5 MCG/ACT AERO (BUDESONIDE-FORMOTEROL FUMARATE) 2 PUFFS DAILY TRIAMCINOLONE ACETONIDE 0.1 % CREA (TRIAMCINOLONE ACETONIDE) apply to skin two times a day as needed rash PRILOSEC 20 MG CPDR (OMEPRAZOLE) one tab by mouth 45 mins prior to brfst VALIUM 5 MG TABS (DIAZEPAM) take one by mouth 4 times a day ZAROXOLYN 2.5 MG TABS (METOLAZONE) one tab by mouth 1/2 hr before Lasix in AM. CIPROFLOXACIN HCL 500 MG TABS (CIPROFLOXACIN HCL) 1 tab two times a day x 5 days MIRALAX  POWD  (POLYETHYLENE GLYCOL 3350) Daily PROMETHAZINE HCL 25 MG TABS (PROMETHAZINE HCL) as needed MORPHINE SULFATE 15 MG TABS (MORPHINE SULFATE) One or two tablets as needed, added to Percocet. Current Allergies: ! * AMBIEN ! ASPIRIN ! BACLOFEN ! * CARDIZEM CD ! CELEBREX ! CODEINE ! * COUMADIN ! ELAVIL ! * KLONOPIN ! * LUDOMIL ! * METHADONE ! NEURONTIN ! PHENOBARBITAL ! PROZAC ! * SINEQUAN ! * SPIRIVA ! TALWIN ! * TRANSENE ! VICODIN ! VIOXX ! Prudy Feeler ! * DESEYREL ! PRINIVIL ! LEVAQUIN ! Roland Rack BOOT

## 2010-03-26 NOTE — Miscellaneous (Signed)
Summary: Orders Update  Clinical Lists Changes  Orders: Added new Referral order of Echocardiogram (Echo) - Signed 

## 2010-03-26 NOTE — Progress Notes (Signed)
Summary: refill request for lantus  Phone Note Refill Request Message from:  Fax from Pharmacy  Refills Requested: Medication #1:  LANTUS 100 UNIT/ML SOLN Inject 85 units subcutaneously every night [facility] is on your desk.  This was refilled on 8/02 but pt didnt receive it.  Initial call taken by: Lowella Petties CMA,  October 23, 2009 8:21 AM  Follow-up for Phone Call        signed,  thanks.  Follow-up by: Crawford Givens MD,  October 23, 2009 1:29 PM  Additional Follow-up for Phone Call Additional follow up Details #1::        Faxed. Additional Follow-up by: Delilah Shan CMA (AAMA),  October 23, 2009 2:06 PM

## 2010-03-26 NOTE — Assessment & Plan Note (Signed)
Summary: CHECK WOUND ON LEG   Vital Signs:  Patient profile:   77 year old female Height:      64 inches Weight:      250.50 pounds BMI:     43.15 Temp:     98 degrees F oral Pulse rate:   80 / minute Pulse rhythm:   regular BP sitting:   132 / 54  (left arm) Cuff size:   large  Vitals Entered By: Delilah Shan CMA Duncan Dull) (September 07, 2009 10:18 AM) CC: Check wound on leg   History of Present Illness: H/o leg wounds that have not required unna boot.  usually resolve with elevation and bactroban.  Recently with another lesion.  Noted h/o leg edema.  "My skin was weeping with water running out."   CKD #4 per renal and already on medical therapy.  No fevers.  Less erythema per patient.  "I think it's healing but I wanted it checked out."  Has home health.  Needs help with bandages.  Lives along and cannot manipulate bandage due to back pain and prev back surgery. Recent glucose readings reviewed and controlled.   Allergies: 1)  ! * Ambien 2)  ! Aspirin 3)  ! Baclofen 4)  ! * Cardizem Cd 5)  ! Celebrex 6)  ! Codeine 7)  ! * Coumadin 8)  ! Elavil 9)  ! * Klonopin 10)  ! * Ludomil 11)  ! * Methadone 12)  ! Neurontin 13)  ! Phenobarbital 14)  ! Prozac 15)  ! * Sinequan 16)  ! * Spiriva 17)  ! Talwin 18)  ! * Transene 19)  ! Vicodin 20)  ! Vioxx 21)  ! Xanax 22)  ! * Deseyrel 23)  ! Prinivil 24)  ! Levaquin  Past History:  Past Medical History: Last updated: 09/02/2008 Asthma (02/24/1933) COPD (02/24/1986) Diabetes mellitus, type II (02/25/1987) Hypertension (02/24/1982) Hypothyroidism (02/24/1957) Hyperlipidemia (11/25/1994) Atrial fibrillation (04/24/1997) Subdural hematoma while on coumadin Nonobstructive carotid diease Spinal cord injury with chronic diability and neurogenic bladder Abdominal hernia Colon polyps  Review of Systems       See HPI.  Otherwise noncontributory.    Physical Exam  General:  GEN: nad, alert and oriented, in scooter HEENT:  mucous membranes moist NECK: supple w/o LA CV: regular rate and rhythm with occ ectopy PULM: no increase in wob, but with occ scattered wheeze (typical for patient on a humid day) EXT: 3+ edema SKIN: 3x4.5 cm bullous lesion with slight erythema but no purulent discharge.  no fluctuant mass.    Impression & Recommendations:  Problem # 1:  ULCER, LEG (ICD-707.10) Will have home health help patient and continue elevation with nonstick bandage and bactoban.  follow up as needed.  she agrees.  She appears to be improving and has no fever, fluctuance.  I think this will resolve w/o unna boot.  I see no reason to refer to wound center now.  I d/w patient about bilateral lower extremity edema.  I would not change lasix given her renal fxn.  She agrees with plan and can call back as needed.   Complete Medication List: 1)  Atrovent Hfa 17 Mcg/act Aers (Ipratropium bromide hfa) .... Inhale 2 puff using inhaler two times a day 2)  Lanoxin 0.125 Mg Tabs (Digoxin) .... Take 1 tablet by mouth daily 3)  Lescol Xl 80 Mg Tb24 (Fluvastatin sodium) .... Take 1 tablet by mouth every night 4)  Lantus 100 Unit/ml Soln (Insulin glargine) .... Inject  85 units subcutaneously every night 5)  Levothyroxine Sodium 112 Mcg Tabs (Levothyroxine sodium) .... One tab by mouth once daily 6)  Lasix 80 Mg Tabs (Furosemide) .... Take 2 tablets by mouth two  times a day 7)  Actos 45 Mg Tabs (Pioglitazone hcl) .... One tab by mouth once daily 8)  Humalog 100 Unit/ml Soln (Insulin lispro (human)) .... Inject dose  per sliding scale subcutaneously as directed 9)  Calcitriol 0.5 Mcg Caps (Calcitriol) .... Take one by mouth daily 10)  Bd Integra Insulin Syringe 29g X 1/2" 1 Ml Misc (Insulin syringe-needle u-100) .Marland Kitchen.. 100  in box uses 4 per day with insulin 11)  Bactroban 2 % Crea (Mupirocin calcium) .... Apply to area daily with dressing change 12)  Percocet 10-325 Mg Tabs (Oxycodone-acetaminophen) .... Take 2 by mouth four times a  day with extra 2 for flare ups as needed 13)  Toprol Xl 100 Mg Xr24h-tab (Metoprolol succinate) .Marland Kitchen.. 1 daily by mouth 14)  Symbicort 160-4.5 Mcg/act Aero (Budesonide-formoterol fumarate) .... 2 puffs daily 15)  Triamcinolone Acetonide 0.1 % Crea (Triamcinolone acetonide) .... Apply to skin two times a day as needed rash 16)  Prilosec 20 Mg Cpdr (Omeprazole) .... One tab by mouth 45 mins prior to brfst 17)  Valium 5 Mg Tabs (Diazepam) .... Take one by mouth 4 times a day 18)  Zaroxolyn 2.5 Mg Tabs (Metolazone) .... One tab by mouth 1/2 hr before lasix in am. 19)  Ciprofloxacin Hcl 500 Mg Tabs (Ciprofloxacin hcl) .Marland Kitchen.. 1 tab two times a day x 5 days 20)  Miralax Powd (Polyethylene glycol 3350) .... Daily 21)  Promethazine Hcl 25 Mg Tabs (Promethazine hcl) .... As needed 22)  Morphine Sulfate 15 Mg Tabs (Morphine sulfate) .... One or two tablets as needed, added to percocet.  Patient Instructions: 1)  Please schedule a follow-up appointment as needed .  Let me know if the blister gets bigger, redder, or if you have a fever.  Take care.  We'll talk to home health.  Prescriptions: BACTROBAN 2 %  CREA (MUPIROCIN CALCIUM) apply to area daily with dressing change  #15grams x 1   Entered and Authorized by:   Crawford Givens MD   Signed by:   Crawford Givens MD on 09/07/2009   Method used:   Electronically to        [facility] [phone]* (retail)       [address] [place], [place]  [address]       Ph: [phone]       Fax: [phone]   RxID:   [id number]   Current Allergies (reviewed today): ! Remus Loffler ! ASPIRIN ! BACLOFEN ! * CARDIZEM CD ! CELEBREX ! CODEINE ! * COUMADIN ! ELAVIL ! * KLONOPIN ! * LUDOMIL ! * METHADONE ! NEURONTIN ! PHENOBARBITAL ! PROZAC ! * SINEQUAN ! * SPIRIVA ! TALWIN ! * TRANSENE ! VICODIN ! VIOXX ! Prudy Feeler ! * DESEYREL ! PRINIVIL ! LEVAQUIN

## 2010-03-26 NOTE — Progress Notes (Signed)
Summary: Allergic reaction to Levaquin  Phone Note Call from Patient   Caller: Patient Call For: Shaune Leeks MD Summary of Call: Patient was given Levaquin yesterday and now has had an allergic reaction to this medication. Patient woke up during the night with a rash and burning sensation and started taking Benadryl and is doing some better at this time. Patient states that she is allergic to a lot of medications and this must be one of them. Patient does not understand why this report was sent here because the home health nurse should have sent it to Dr. Jacquelyne Balint. Patient requested that this report be sent to Dr. Jacquelyne Balint and that she will be calling his office now to discuss what is going on now. Patient states that she appreciates what Dr. Milinda Antis did for her, but will contact her urologist at this time. Patient said that she will let the home health nurse know next week that she had this report sent to the wrong doctor. Initial call taken by: Sydell Axon LPN,  March 08, 2009 9:36 AM  Follow-up for Phone Call        Lab report faxed to Dr. Jacquelyne Balint. Patient stated that she has called and talked with his office and they are to call her back with what he recommends.  stop levaquin / f/u with urology - agree with above --MT Follow-up by: Sydell Axon LPN,  March 08, 2009 9:57 AM  Additional Follow-up for Phone Call Additional follow up Details #1::        Patient informed to stop Levaquin as instructed. Additional Follow-up by: Sydell Axon LPN,  March 08, 2009 10:25 AM

## 2010-03-26 NOTE — Progress Notes (Signed)
Summary: wound on leg  Phone Note Call from Patient Call back at Home Phone [phone]   Caller: Patient Call For: Shaune Leeks MD Summary of Call: Pt states she has a sore on the back of her leg that you are aware of.  She says this has come up and is about 3" in diameter and water is running out of it.  Painful.  Can you check her for this before you leaves, or what should she do?  Uses [facility]. Initial call taken by: Lowella Petties CMA,  August 22, 2009 2:51 PM  Follow-up for Phone Call        Apply bactroban three times a day with nonstick pad over and then minimal tension Ace wrap. Give a week or more to start healing. If it does not, call for referral to diabetic wound center. Follow-up by: Shaune Leeks MD,  August 22, 2009 3:38 PM  Additional Follow-up for Phone Call Additional follow up Details #1::        Explained to pt what she needs to do, med called to [facility]- they will see that pt gets the non stick pads and ace wrap. Additional Follow-up by: Lowella Petties CMA,  August 22, 2009 3:55 PM    Prescriptions: BACTROBAN 2 %  CREA (MUPIROCIN CALCIUM) apply to area two times a day patient unsure if needs  #1 tube x 1   Entered and Authorized by:   Shaune Leeks MD   Signed by:   Shaune Leeks MD on 08/22/2009   Method used:   Telephoned to ...       [facility] Environmental education officer)       [address] [address]       [place], [place]  [address]       Ph: [phone]       Fax: [phone]   RxID:   [id number]

## 2010-03-26 NOTE — Medication Information (Signed)
Summary: [facility],Prior Auth approval for Atrovent HFA  [facility],Prior Auth approval for Atrovent HFA   Imported By: Beau Fanny 06/14/2009 14:11:44  _____________________________________________________________________  External Attachment:    Type:   Image     Comment:   External Document

## 2010-03-26 NOTE — Miscellaneous (Signed)
Summary: Lantus,Omeprazole,Atrovent,Actos,Levothyroxine   Clinical Lists Changes  Medications: Rx of LANTUS 100 UNIT/ML SOLN (INSULIN GLARGINE) Inject 85 units subcutaneously every night;  #90 days x 4;  Signed;  Entered by: Delilah Shan CMA (AAMA);  Authorized by: Crawford Givens MD;  Method used: Handwritten Rx of PRILOSEC 20 MG CPDR (OMEPRAZOLE) one tab by mouth 45 mins prior to brfst;  #90 x 4;  Signed;  Entered by: Delilah Shan CMA (AAMA);  Authorized by: Crawford Givens MD;  Method used: Handwritten Rx of ATROVENT HFA 17 MCG/ACT AERS (IPRATROPIUM BROMIDE HFA) Inhale 2 puff using inhaler two times a day;  #2 x 4;  Signed;  Entered by: Delilah Shan CMA (AAMA);  Authorized by: Crawford Givens MD;  Method used: Handwritten Rx of ACTOS 45 MG TABS (PIOGLITAZONE HCL) one tab by mouth once daily;  #90 x 4;  Signed;  Entered by: Delilah Shan CMA (AAMA);  Authorized by: Crawford Givens MD;  Method used: Handwritten Rx of LEVOTHYROXINE SODIUM 112 MCG TABS (LEVOTHYROXINE SODIUM) one tab by mouth once daily;  #90 x 4;  Signed;  Entered by: Delilah Shan CMA (AAMA);  Authorized by: Crawford Givens MD;  Method used: Handwritten    Prescriptions: LEVOTHYROXINE SODIUM 112 MCG TABS (LEVOTHYROXINE SODIUM) one tab by mouth once daily  #90 x 4   Entered by:   Delilah Shan CMA (AAMA)   Authorized by:   Crawford Givens MD   Signed by:   Delilah Shan CMA (AAMA) on 09/25/2009   Method used:   Handwritten   RxID:   [id number] ACTOS 45 MG TABS (PIOGLITAZONE HCL) one tab by mouth once daily  #90 x 4   Entered by:   Delilah Shan CMA (AAMA)   Authorized by:   Crawford Givens MD   Signed by:   Delilah Shan CMA (AAMA) on 09/25/2009   Method used:   Handwritten   RxID:   [id number] ATROVENT HFA 17 MCG/ACT AERS (IPRATROPIUM BROMIDE HFA) Inhale 2 puff using inhaler two times a day  #2 x 4   Entered by:   Delilah Shan CMA (AAMA)   Authorized by:   Crawford Givens MD   Signed by:   Delilah Shan CMA (AAMA) on  09/25/2009   Method used:   Handwritten   RxID:   [id number] PRILOSEC 20 MG CPDR (OMEPRAZOLE) one tab by mouth 45 mins prior to brfst  #90 x 4   Entered by:   Delilah Shan CMA (AAMA)   Authorized by:   Crawford Givens MD   Signed by:   Delilah Shan CMA (AAMA) on 09/25/2009   Method used:   Handwritten   RxID:   [id number] LANTUS 100 UNIT/ML SOLN (INSULIN GLARGINE) Inject 85 units subcutaneously every night  #90 days x 4   Entered by:   Delilah Shan CMA (AAMA)   Authorized by:   Crawford Givens MD   Signed by:   Delilah Shan CMA (AAMA) on 09/25/2009   Method used:   Handwritten   RxID:   [id number]

## 2010-03-26 NOTE — Progress Notes (Signed)
Summary: Reaction to unna boot  Phone Note Outgoing Call   Call placed by: Delilah Shan CMA Calianna Kim Dull),  September 14, 2009 10:10 AM Call placed to: [facility] [phone] Devoria Glassing Summary of Call: We received notification that an unna boot was placed on Ms. Cwik by [facility].  The patient called [facility] with a reaction to the boot and [facility] then removed the boot, cleaned the leg, found the wound almost healed and edema decreased.  They did not replace the boot.  Devoria Glassing was contacted by me per Dr. Para March to find out who had ordered the unna boot.  Devoria Glassing says they were told to use routine protocol for treatment and when the wound was assessed, that protocol was recommended and applied.  Note was made on the patient's chart about the reaction to the unna boot.   Initial call taken by: Delilah Shan CMA Ikey Omary Dull),  September 14, 2009 10:15 AM  Follow-up for Phone Call       Follow-up by: Crawford Givens MD,  September 14, 2009 10:18 AM   New Allergies: ! Roland Rack BOOT New Allergies: ! Roland Rack BOOT

## 2010-03-26 NOTE — Progress Notes (Signed)
Summary: refill request for atrovent  Phone Note Refill Request Message from:  Fax from Pharmacy  Refills Requested: Medication #1:  ATROVENT HFA 17 MCG/ACT AERS Inhale 2 puff using inhaler two times a day Faxed form from [facility] is on your shelf.  Initial call taken by: Lowella Petties CMA,  June 11, 2009 10:02 AM  Follow-up for Phone Call        Form faxed. Follow-up by: Lowella Petties CMA,  June 11, 2009 10:39 AM    Prescriptions: ATROVENT HFA 17 MCG/ACT AERS (IPRATROPIUM BROMIDE HFA) Inhale 2 puff using inhaler two times a day  #2 x 1   Entered and Authorized by:   Shaune Leeks MD   Signed by:   Shaune Leeks MD on 06/11/2009   Method used:   Printed then faxed to ...       [facility] Environmental education officer)       [address] [address]       [place], [place]  [address]       Ph: [phone]       Fax: [phone]   RxID:   [id number]

## 2010-03-26 NOTE — Medication Information (Signed)
Summary: Lantus/[facility]  Lantus/[facility]   Imported By: Lanelle Bal 10/31/2009 13:15:00  _____________________________________________________________________  External Attachment:    Type:   Image     Comment:   External Document

## 2010-03-26 NOTE — Progress Notes (Signed)
Summary: further clarification on humalog  Phone Note From Pharmacy   Caller: [facility] Summary of Call: [facility] is asking for further clarification on humalog.  form is on your shelf. Initial call taken by: Lowella Petties CMA,  June 22, 2009 5:01 PM  Follow-up for Phone Call        Forms faxed. Follow-up by: Lowella Petties CMA,  Jun 25, 2009 9:05 AM    New/Updated Medications: HUMALOG 100 UNIT/ML SOLN (INSULIN LISPRO (HUMAN)) Inject dose  per sliding scale subcutaneously as directed Prescriptions: HUMALOG 100 UNIT/ML SOLN (INSULIN LISPRO (HUMAN)) Inject dose  per sliding scale subcutaneously as directed  #9 vials x 3   Entered and Authorized by:   Shaune Leeks MD   Signed by:   Shaune Leeks MD on 06/25/2009   Method used:   Print then Give to Patient   RxID:   [id number]

## 2010-03-26 NOTE — Assessment & Plan Note (Signed)
Summary: 3 month f/u per dr schaller/[facility] r/s 11/01/09   Vital Signs:  Patient profile:   77 year old female Height:      64 inches Weight:      244.75 pounds BMI:     42.16 Temp:     98 degrees F oral Pulse rate:   96 / minute Pulse rhythm:   regular BP sitting:   122 / 58  (left arm) Cuff size:   large  Vitals Entered By: Delilah Shan CMA  Dull) (November 02, 2009 11:17 AM) CC: 3 months follow up per [facility]   History of Present Illness: Diabetes:  Using medications without difficulties:yes Hypoglycemic episodes:rare Hyperglycemic episodes:no Feet problems:see exam Blood Sugars averaging: ~100 in AM  Leg wound is some improved.    Has follow up with renal pending.    Allergies: 1)  ! * Ambien 2)  ! Aspirin 3)  ! Baclofen 4)  ! * Cardizem Cd 5)  ! Celebrex 6)  ! Codeine 7)  ! * Coumadin 8)  ! Elavil 9)  ! * Klonopin 10)  ! * Ludomil 11)  ! * Methadone 12)  ! Neurontin 13)  ! Phenobarbital 14)  ! Prozac 15)  ! * Sinequan 16)  ! * Spiriva 17)  ! Talwin 18)  ! * Transene 19)  ! Vicodin 20)  ! Vioxx 21)  ! Xanax 22)  ! * Deseyrel 23)  ! Prinivil 24)  ! Levaquin 25)  ! * Henriette Combs  Past History:  Past Medical History: Reviewed history from 09/02/2008 and no changes required. Asthma (02/24/1933) COPD (02/24/1986) Diabetes mellitus, type II (02/25/1987) Hypertension (02/24/1982) Hypothyroidism (02/24/1957) Hyperlipidemia (11/25/1994) Atrial fibrillation (04/24/1997) Subdural hematoma while on coumadin Nonobstructive carotid diease Spinal cord injury with chronic diability and neurogenic bladder Abdominal hernia Colon polyps  Social History: Reviewed history from 05/30/2006 and no changes required. Med retired [employer] 2 children Retired Divorced Former Smoker  Review of Systems       See HPI.  Otherwise negative.    Physical Exam  General:  GEN: nad, alert and oriented, in scooter HEENT: mucous membranes moist NECK: supple w/o  LA CV: regular rate and rhythm with occ ectopy PULM: no increase in wob EXT: 2-3+ edema SKIN: ulceration has healed.  she still has some bullous changes that are not ulcerated or infected appearing.  Skin is intact o/w.   Diabetes Management Exam:    Foot Exam (with socks and/or shoes not present):       Sensory-Pinprick/Light touch:          Left medial foot (L-4): diminished          Left dorsal foot (L-5): diminished          Left lateral foot (S-1): diminished          Right medial foot (L-4): diminished          Right dorsal foot (L-5): diminished          Right lateral foot (S-1): diminished       Sensory-Monofilament:          Left foot: diminished          Right foot: diminished       Sensory-other: decrease in sensation noted, R>L       Inspection:          Left foot: normal          Right foot: normal       Nails:  Left foot: thickened          Right foot: thickened   Impression & Recommendations:  Problem # 1:  DIABETES MELLITUS, TYPE II (ICD-250.00) d/w patient re: lantus titration based on glucose.  She understands.  Continue to work on diet and mobility.  she understands.   contact with A1c. >25 min spent with patient, at least half of which was spent on counseling re:DM2.  >25 min spent with patient, at least half of which was spent on counseling  Her updated medication list for this problem includes:    Lantus 100 Unit/ml Soln (Insulin glargine) ..... Inject 85 units subcutaneously every night    Actos 45 Mg Tabs (Pioglitazone hcl) ..... One tab by mouth once daily    Humalog 100 Unit/ml Soln (Insulin lispro (human)) ..... Inject dose  per sliding scale subcutaneously as directed  Orders: TLB-A1C / Hgb A1C (Glycohemoglobin) (83036-A1C)  Complete Medication List: 1)  Atrovent Hfa 17 Mcg/act Aers (Ipratropium bromide hfa) .... Inhale 2 puff using inhaler two times a day 2)  Lanoxin 0.125 Mg Tabs (Digoxin) .... Take 1 tablet by mouth daily 3)  Lescol Xl  80 Mg Tb24 (Fluvastatin sodium) .... Take 1 tablet by mouth every night 4)  Lantus 100 Unit/ml Soln (Insulin glargine) .... Inject 85 units subcutaneously every night 5)  Levothyroxine Sodium 112 Mcg Tabs (Levothyroxine sodium) .... One tab by mouth once daily 6)  Lasix 80 Mg Tabs (Furosemide) .... Take 2 tablets by mouth two  times a day 7)  Actos 45 Mg Tabs (Pioglitazone hcl) .... One tab by mouth once daily 8)  Humalog 100 Unit/ml Soln (Insulin lispro (human)) .... Inject dose  per sliding scale subcutaneously as directed 9)  Calcitriol 0.5 Mcg Caps (Calcitriol) .... Take one by mouth daily 10)  Bd Insulin Syringe Ultrafine 30g X 1/2" 1 Ml Misc (Insulin syringe-needle u-100) .... Use 4 times per day w/ insulin 11)  Bactroban 2 % Crea (Mupirocin calcium) .... Apply to area daily with dressing change 12)  Percocet 10-325 Mg Tabs (Oxycodone-acetaminophen) .... Take 2 by mouth four times a day with extra 2 for flare ups as needed 13)  Toprol Xl 100 Mg Xr24h-tab (Metoprolol succinate) .Marland Kitchen.. 1 daily by mouth 14)  Symbicort 160-4.5 Mcg/act Aero (Budesonide-formoterol fumarate) .... 2 puffs daily 15)  Triamcinolone Acetonide 0.1 % Crea (Triamcinolone acetonide) .... Apply to skin two times a day as needed rash 16)  Prilosec 20 Mg Cpdr (Omeprazole) .... One tab by mouth 45 mins prior to brfst 17)  Valium 5 Mg Tabs (Diazepam) .... Take one by mouth 4 times a day 18)  Zaroxolyn 2.5 Mg Tabs (Metolazone) .... One tab by mouth 1/2 hr before lasix in am. 19)  Ciprofloxacin Hcl 500 Mg Tabs (Ciprofloxacin hcl) .Marland Kitchen.. 1 tab two times a day x 5 days 20)  Miralax Powd (Polyethylene glycol 3350) .... Daily 21)  Promethazine Hcl 25 Mg Tabs (Promethazine hcl) .... As needed 22)  Morphine Sulfate 15 Mg Tabs (Morphine sulfate) .... One or two tablets up to twice daily as needed, added to percocet.   Patient Instructions: 1)  If your morning sugar is less than 90, then decrease your next lantus dose by 1 unit.  2)  If  your moring sugar is above 110, then increase your next lantus dose by 1 unit.  3)  If your sugar is between 91 and 109, don't change your lantus dose.  4)  Follow up in 3 months.  Current Allergies (reviewed today): ! * AMBIEN ! ASPIRIN ! BACLOFEN ! * CARDIZEM CD ! CELEBREX ! CODEINE ! * COUMADIN ! ELAVIL ! * KLONOPIN ! * LUDOMIL ! * METHADONE ! NEURONTIN ! PHENOBARBITAL ! PROZAC ! * SINEQUAN ! * SPIRIVA ! TALWIN ! * TRANSENE ! VICODIN ! VIOXX ! Prudy Feeler ! * DESEYREL ! PRINIVIL ! LEVAQUIN ! Roland Rack BOOT

## 2010-03-27 ENCOUNTER — Encounter: Payer: Self-pay | Admitting: Family Medicine

## 2010-03-28 NOTE — Assessment & Plan Note (Signed)
Summary: FOLLOW-UP ON DIABETES/JRR   Vital Signs:  Patient profile:   77 year old female Weight:      245 pounds Temp:     98.6 degrees F oral Pulse rate:   72 / minute Pulse rhythm:   regular BP sitting:   130 / 68  (left arm) Cuff size:   large  Vitals Entered By: Mervin Hack CMA  Dull) (February 08, 2010 11:05 AM) CC: follow-up visit diabetes   History of Present Illness: Diabetes:  Using medications without difficulties:yes Hypoglycemic episodes:no Hyperglycemic episodes:occ Feet problems: still with edema in bilateral lower extremities Blood Sugars averaging:  ~100 in AM   We talked about actos.  At this point, there is no clear evidence to direct a change in meds (re: actos) but patient intially had concern about this.  We talked about it today but she didn't want to stop the actos.    L thumb is very sensitive to cold and tough, pain radiates into L palm.  Very tender to palpation on dorsum of thumb.  "I woke up one day and it was like that."  R handed.  Going on for years.  Worse in the winter and getting worse this year.  Better with a glove on it to keep it warm.    Allergies: 1)  ! * Ambien 2)  ! Aspirin 3)  ! Baclofen 4)  ! * Cardizem Cd 5)  ! Celebrex 6)  ! Codeine 7)  ! * Coumadin 8)  ! Elavil 9)  ! * Klonopin 10)  ! * Ludomil 11)  ! * Methadone 12)  ! Neurontin 13)  ! Phenobarbital 14)  ! Prozac 15)  ! * Sinequan 16)  ! * Spiriva 17)  ! Talwin 18)  ! * Transene 19)  ! Vicodin 20)  ! Vioxx 21)  ! Xanax 22)  ! * Deseyrel 23)  ! Prinivil 24)  ! Levaquin 25)  ! Roland Rack Boot  Review of Systems       See HPI.  Otherwise negative.    Physical Exam  General:  GEN: nad, alert and oriented, in scooter HEENT: mucous membranes moist NECK: supple w/o LA CV: regular rate and rhythm with occ ectopy PULM: no increase in wob EXT: 2-3+ edema SKIN: no ulceration.  no bullous changes Skin is intact o/w. L thumb tender to palpation and finkelstein is  positive.  no erytheman, normal cap refill   Diabetes Management Exam:    Foot Exam (with socks and/or shoes not present):       Sensory-Pinprick/Light touch:          Left medial foot (L-4): normal          Left dorsal foot (L-5): normal          Left lateral foot (S-1): normal          Right medial foot (L-4): diminished          Right dorsal foot (L-5): diminished          Right lateral foot (S-1): diminished       Sensory-Monofilament:          Left foot: normal          Right foot: diminished       Inspection:          Left foot: normal          Right foot: normal   Impression & Recommendations:  Problem # 1:  DIABETES MELLITUS,  TYPE II (ICD-250.00) See notes on labs re: meds.  We talked  about insulin dosing today, but I want to see her A1c first.  Her updated medication list for this problem includes:    Lantus 100 Unit/ml Soln (Insulin glargine) ..... Inject 85 units subcutaneously every night    Actos 45 Mg Tabs (Pioglitazone hcl) ..... One tab by mouth once daily    Humalog 100 Unit/ml Soln (Insulin lispro (human)) ..... Inject dose  per sliding scale subcutaneously as directed  Orders: TLB-A1C / Hgb A1C (Glycohemoglobin) (83036-A1C)  Problem # 2:  THUMB PAIN, LEFT (ICD-729.5) Rx given for spica to use.  follow up if not improved. She agrees.  I think that she has irritated the extensor compartment.   Problem # 3:  EDEMA (ICD-782.3) See notes on labs [id number].  Keep feet elevated in meantime.  The following medications were removed from the medication list:    Zaroxolyn 2.5 Mg Tabs (Metolazone) ..... One tab by mouth 1/2 hr before lasix in am. Her updated medication list for this problem includes:    Lasix 80 Mg Tabs (Furosemide) .Marland Kitchen... Take 2 tablets by mouth two  times a day  Orders: TLB-BMP (Basic Metabolic Panel-BMET) (80048-METABOL) TLB-Hepatic/Liver Function Pnl (80076-HEPATIC) TLB-BNP (B-Natriuretic Peptide) (83880-BNPR)  Complete Medication List: 1)   Atrovent Hfa 17 Mcg/act Aers (Ipratropium bromide hfa) .... Inhale 2 puff using inhaler two times a day 2)  Lanoxin 0.125 Mg Tabs (Digoxin) .... Take 1 tablet by mouth daily 3)  Lescol Xl 80 Mg Tb24 (Fluvastatin sodium) .... Take 1 tablet by mouth every night 4)  Lantus 100 Unit/ml Soln (Insulin glargine) .... Inject 85 units subcutaneously every night 5)  Levothyroxine Sodium 112 Mcg Tabs (Levothyroxine sodium) .... One tab by mouth once daily 6)  Lasix 80 Mg Tabs (Furosemide) .... Take 2 tablets by mouth two  times a day 7)  Actos 45 Mg Tabs (Pioglitazone hcl) .... One tab by mouth once daily 8)  Humalog 100 Unit/ml Soln (Insulin lispro (human)) .... Inject dose  per sliding scale subcutaneously as directed 9)  Calcitriol 0.5 Mcg Caps (Calcitriol) .... Take one by mouth daily 10)  Bd Insulin Syringe Ultrafine 30g X 1/2" 1 Ml Misc (Insulin syringe-needle u-100) .... Use 4 times per day w/ insulin 11)  Bactroban 2 % Crea (Mupirocin calcium) .... Apply to area daily with dressing change 12)  Percocet 10-325 Mg Tabs (Oxycodone-acetaminophen) .... Take 2 by mouth four times a day with extra 2 for flare ups as needed 13)  Toprol Xl 100 Mg Xr24h-tab (Metoprolol succinate) .Marland Kitchen.. 1 daily by mouth 14)  Symbicort 160-4.5 Mcg/act Aero (Budesonide-formoterol fumarate) .... 2 puffs daily 15)  Prilosec 20 Mg Cpdr (Omeprazole) .... One tab by mouth 45 mins prior to brfst 16)  Valium 5 Mg Tabs (Diazepam) .... Take one by mouth 4 times a day 17)  Miralax Powd (Polyethylene glycol 3350) .... Daily 18)  Promethazine Hcl 25 Mg Tabs (Promethazine hcl) .... As needed 19)  Morphine Sulfate 15 Mg Tabs (Morphine sulfate) .... One or two tablets up to twice daily as needed, added to percocet. 20)  Wrist-thumb Support/neoprene L Misc (Elastic bandages & supports) .... Need extra large thumb spica for left hand  Patient Instructions: 1)  I'll send word about your labs. Keep your legs elevated in the meantime.  Use the  brace and let me know if that doesn't help.  I want to see you back in 3 months for another visit [30 min],  sooner se needed.  Take care.  Glad to see you today.  Prescriptions: WRIST-THUMB SUPPORT/NEOPRENE L  MISC (ELASTIC BANDAGES & SUPPORTS) need extra large thumb spica for left hand  #1 x 0   Entered by:   Mervin Hack CMA (AAMA)   Authorized by:   Crawford Givens MD   Signed by:   Mervin Hack CMA (AAMA) on 02/08/2010   Method used:   Print then Give to Patient   RxID:   [id number]    Orders Added: 1)  Est. Patient Level IV [65784] 2)  TLB-BMP (Basic Metabolic Panel-BMET) [80048-METABOL] 3)  TLB-Hepatic/Liver Function Pnl [80076-HEPATIC] 4)  TLB-BNP (B-Natriuretic Peptide) [83880-BNPR] 5)  TLB-A1C / Hgb A1C (Glycohemoglobin) [83036-A1C]    Current Allergies (reviewed today): ! * AMBIEN ! ASPIRIN ! BACLOFEN ! * CARDIZEM CD ! CELEBREX ! CODEINE ! * COUMADIN ! ELAVIL ! * KLONOPIN ! * LUDOMIL ! * METHADONE ! NEURONTIN ! PHENOBARBITAL ! PROZAC ! * SINEQUAN ! * SPIRIVA ! TALWIN ! * TRANSENE ! VICODIN ! VIOXX ! Prudy Feeler ! * DESEYREL ! PRINIVIL ! LEVAQUIN ! Roland Rack BOOT

## 2010-03-28 NOTE — Progress Notes (Signed)
Summary: needs brace for thumb  Phone Note Call from Patient Call back at Home Phone [phone]   Caller: Patient Call For: Crawford Givens MD Summary of Call: Pt was seen on 12/16 and given an order for a splint for her thumb.  Her neice took the script to [facility] but they didnt have anything that would work for the pt.  She is asking for [place] to send another order to another store that would have something that will work.  Do we have anything here,  maybe in Dr. Brayton Layman supplies? Initial call taken by: Lowella Petties CMA, AAMA,  February 12, 2010 12:03 PM  Follow-up for Phone Call        please call a medical supply store and see if they have a L thumb spica that could fit the patient (large or extra large). see prev OV note.  thanks.  Follow-up by: Crawford Givens MD,  February 12, 2010 1:34 PM  Additional Follow-up for Phone Call Additional follow up Details #1::        Patient advised to call [facility] or [facility] or [place].  She will call back with information.  Lugene Fuquay CMA Duncan Dull)  February 12, 2010 2:27 PM   Patient states that [facility] has it and there is not a prescription needed.  They just need [place] to call to tell them what she needs.  I'll do that. Additional Follow-up by: Delilah Shan CMA Duncan Dull),  February 12, 2010 4:33 PM

## 2010-03-28 NOTE — Progress Notes (Signed)
Summary: Medication confusion  Medications Added CRESTOR 40 MG TABS (ROSUVASTATIN CALCIUM) one tabet at bedtime       Phone Note Call from Patient Call back at Home Phone [phone]   Caller: Self Call For: Mariah Milling Reason for Call: Privacy/Consent Authorization Summary of Call: Insurance would not approve Crestor.  Lipitor was ordered. Insurance company just called the pt to let her know the Crestor was approved.  Lipitor was just delivered to the pt from the pharmacy.  Which medication was called into [facility]?  Pt is very confused about which medication to take.  She is concerned about taking the Lipitor now if the Crestor is what is going to come from [employer]. Initial call taken by: Harlon Flor,  March 15, 2010 2:28 PM  Follow-up for Phone Call        notified patient to take crestor 40 mg one tablet at bedtime.  Spoke with the pharmacy and they will take the lipitor back because of this situation.  The crestor will also be sent from the mail order as I spoke with them regarding the approved auth. for crestor.  Follow-up by: Bishop Dublin, CMA,  March 18, 2010 2:21 PM  Additional Follow-up for Phone Call Additional follow up Details #1::        Spoke to [facility] regarding the lipitor and crestor orders. Told her to cancel the lipitor  Rx since crestor 40 mg  was approved.  Spoke with the pharmacist Lucita Lora.) A new Rx for Crestor 40 mg one tablet at bedtime. Additional Follow-up by: Bishop Dublin, CMA,  March 18, 2010 2:44 PM    New/Updated Medications: CRESTOR 40 MG TABS (ROSUVASTATIN CALCIUM) one tabet at bedtime

## 2010-03-28 NOTE — Progress Notes (Signed)
   Phone Note Call from Patient   Caller: Patient Summary of Call: Needs a 30 day supply called to [facility] pharmacy with delivery sent to her home for crestor 40 mg one tablet at bedtime until her [facility]. Initial call taken by: Bishop Dublin, CMA,  March 07, 2010 9:42 AM    Prescriptions: CRESTOR 40 MG TABS (ROSUVASTATIN CALCIUM) Take one tablet by mouth daily.  #30 x 1   Entered by:   Bishop Dublin, CMA   Authorized by:   Dossie Arbour MD   Signed by:   Bishop Dublin, CMA on 03/07/2010   Method used:   Electronically to        [facility] [phone]* (retail)       [address] [place], [place]  [address]       Ph: [phone]       Fax: [phone]   RxID:   [id number]

## 2010-03-28 NOTE — Assessment & Plan Note (Signed)
Summary: F1Y/AMD  Medications Added ACTOS 45 MG TABS (PIOGLITAZONE HCL) 1/2 tab by mouth once daily TOPROL XL 100 MG XR24H-TAB (METOPROLOL SUCCINATE) 1 DAILY BY MOUTH IN MORNING * PREVACID 20 MG one tablet once daily CRESTOR 40 MG TABS (ROSUVASTATIN CALCIUM) Take one tablet by mouth daily. CRESTOR 40 MG TABS (ROSUVASTATIN CALCIUM) Take one tablet by mouth daily.      Allergies Added:   Visit Type:  Initial Consult Referring Provider:  Valera Castle, M.D. Primary Provider:  Shaune Leeks MD  CC:  c/o shortness of breath..  History of Present Illness: Ms Bordner he is a 77 year old woman with chronic atrial fibrillation, long smoking history, history of subdural hematoma while on warfarin, chronic renal insufficiency on diuretics with history of spinal cord injury and neurogenic bladder, peripheral vascular disease with 40-59% left internal carotid arterial disease in 2008 who presents for routine followup.  She reports that her swelling in her upper thighs has been getting worse as has her shortness of breath. She has difficulty showering and taking care of herself. She is using more of her inhalers. She does not do any exercise, her weight has been slowly trending upwards. She did stop smoking 6 years ago but does have a long smoking history.  she is able to walk around her trailer with her walker though uses a scooter when she leaves he trailer. She denies any significant chest pain. With exertion, sometimes she has tachycardia palpitations. She takes her metoprolol in the late afternoon.  She is not taking aspirin she was told by neurology not to take any anticoagulation.  EKG shows atrial fibrillation with ventricular rate 83 beats per minute, no significant ST or T wave changes  Current Medications (verified): 1)  Atrovent Hfa 17 Mcg/act Aers (Ipratropium Bromide Hfa) .... Inhale 2 Puff Using Inhaler Two Times A Day 2)  Lanoxin 0.125 Mg Tabs (Digoxin) .... Take 1 Tablet By  Mouth Daily 3)  Lescol Xl 80 Mg Tb24 (Fluvastatin Sodium) .... Take 1 Tablet By Mouth Every Night 4)  Lantus 100 Unit/ml Soln (Insulin Glargine) .... Inject 85 Units Subcutaneously Every Night 5)  Levothyroxine Sodium 112 Mcg Tabs (Levothyroxine Sodium) .... One Tab By Mouth Once Daily 6)  Lasix 80 Mg Tabs (Furosemide) .... Take 2 Tablets By Mouth Two  Times A Day 7)  Actos 45 Mg Tabs (Pioglitazone Hcl) .... 1/2 Tab By Mouth Once Daily 8)  Humalog 100 Unit/ml Soln (Insulin Lispro (Human)) .... Inject Dose  Per Sliding Scale Subcutaneously As Directed 9)  Calcitriol 0.5 Mcg Caps (Calcitriol) .... Take One By Mouth Daily 10)  Bd Insulin Syringe Ultrafine 30g X 1/2" 1 Ml Misc (Insulin Syringe-Needle U-100) .... Use 4 Times Per Day W/ Insulin 11)  Bactroban 2 %  Crea (Mupirocin Calcium) .... Apply To Area Daily With Dressing Change 12)  Percocet 10-325 Mg Tabs (Oxycodone-Acetaminophen) .... Take 2 By Mouth Four Times A Day With Extra 2 For Flare Ups As Needed 13)  Toprol Xl 100 Mg Xr24h-Tab (Metoprolol Succinate) .Marland Kitchen.. 1 Daily By Mouth 14)  Symbicort 160-4.5 Mcg/act Aero (Budesonide-Formoterol Fumarate) .... 2 Puffs Daily 15)  Prevacid 20 Mg .... One Tablet Once Daily 16)  Valium 5 Mg Tabs (Diazepam) .... Take One By Mouth 4 Times A Day 17)  Miralax  Powd (Polyethylene Glycol 3350) .... Daily 18)  Promethazine Hcl 25 Mg Tabs (Promethazine Hcl) .... As Needed 19)  Morphine Sulfate 15 Mg Tabs (Morphine Sulfate) .... One or Two Tablets Up To Twice  Daily As Needed, Added To Percocet. 20)  Wrist-Thumb Support/neoprene L  Misc (Elastic Bandages & Supports) .... Need Extra Large Thumb Spica For Left Hand  Allergies (verified): 1)  ! * Ambien 2)  ! Aspirin 3)  ! Baclofen 4)  ! * Cardizem Cd 5)  ! Celebrex 6)  ! Codeine 7)  ! * Coumadin 8)  ! Elavil 9)  ! * Klonopin 10)  ! * Ludomil 11)  ! * Methadone 12)  ! Neurontin 13)  ! Phenobarbital 14)  ! Prozac 15)  ! * Sinequan 16)  ! * Spiriva 17)  !  Talwin 18)  ! * Transene 19)  ! Vicodin 20)  ! Vioxx 21)  ! Xanax 22)  ! * Deseyrel 23)  ! Prinivil 24)  ! Levaquin 25)  ! Henriette Combs  Past History:  Past Medical History: Last updated: 09/02/2008 Asthma (02/24/1933) COPD (02/24/1986) Diabetes mellitus, type II (02/25/1987) Hypertension (02/24/1982) Hypothyroidism (02/24/1957) Hyperlipidemia (11/25/1994) Atrial fibrillation (04/24/1997) Subdural hematoma while on coumadin Nonobstructive carotid diease Spinal cord injury with chronic diability and neurogenic bladder Abdominal hernia Colon polyps  Past Surgical History: Last updated: 05/09/2009 Appendectomy w/ ovarian cystectomy  (02/25/1948) Ovarian cystectomy 1958  1960 Partial Hysterectomy 1960 Hysterectomy comlete 1964 Toxic Goiter 1959 Laminectomy L4/L5 1967  1968 Endchondroma rem L Humerus 1963 1964 Bladder tack 1950 1982 Choleycystectomy 1973 Carpal tunnel repair bilat 1974 Spinal Fusion L4/5 L5/S1 1982 1983 1985 Ovarian cyst on bladder 1990 Colon polyps 1992 Abd Hernia repair w/ bowel protrusion  1994 Dehisce of incision 8 mo healing 1994 Rerepair abd hernia  1996 Dobutamine Cardiolite nml EF 80% 06/01/1997 Colonoscopy nml 04/02/2000 EGD 3.69mm  polyps in stomach 04/02/2000 CT scan ABD 2 simple cysts L kidney FATTY LIVER 04/03/2000 ECHO mild AS, Mcalc 05/29/2000 Bone Scan (Levitin) Degen changes lower L?S area 12/15/2000 Colonoscopy polyps 02/2001 Bone Marrow nml 05/2002 [facility] subdural on coumadin 03/11/2003 Cranotomy w/ clot evacuation 03/12/2003 [facility] postop 1/15-1/25/2005 Rehab admiss 1/25-04/04/2003 [facility] hypoglycemia 5/17-5/19/2007 Carotid doppler 0-39 R  40-59 L  01/07/2006 ECHO LVH  EF 55-65%  Triv MR  Mild LAE, RAE  Mild TR   05/08/2009  Family History: Last updated: 05/30/2006 Father dec 60 MI,massive Mother dec 76 Ca of colon Afib Brother A&W Brother Morbid obesity Gastric bypass O2COPD Htn MI Sister Htn DM Sister Family History of CAD Female 1st degree  relative <50 Family History of Colon CA 1st degree relative <60 Family History Diabetes 1st degree relative Family History Hypertension Family History of Stroke M 1st degree relative <50  Social History: Last updated: 05/30/2006 Med retired [employer] 2 children Retired Divorced Former Smoker  Risk Factors: Caffeine Use: 0 (07/28/2006) Exercise: no (07/28/2006)  Risk Factors: Smoking Status: quit (05/30/2006) Packs/Day: 01/2003 75+pyh (05/30/2006) Passive Smoke Exposure: no (07/28/2006)  Review of Systems       The patient complains of weight gain, dyspnea on exertion, and peripheral edema.  The patient denies fever, weight loss, vision loss, decreased hearing, hoarseness, chest pain, syncope, prolonged cough, abdominal pain, incontinence, muscle weakness, depression, and enlarged lymph nodes.    Vital Signs:  Patient profile:   77 year old female Height:      64 inches Weight:      245.50 pounds BMI:     42.29 Pulse rate:   83 / minute BP sitting:   144 / 74  (left arm) Cuff size:   regular  Vitals Entered By: Lysbeth Galas CMA (February 27, 2010 10:52 AM)  Physical Exam  General:  morbidly obese woman sitting in a scooter in no apparent distress, no respiratory distress Head:  normocephalic and atraumatic Neck:  Neck supple, no JVD. No masses, thyromegaly or abnormal cervical nodes. Lungs:  mildly decreased breath sounds throughout otherwise clear Heart:  Non-displaced PMI, chest non-tender; irregular rate and rhythm, S1, S2 with II/VI SEM RSB, no rubs or gallops. Carotid upstroke normal, no bruit. Pedals normal pulses. Trace to 1+ LE edema b/l, no varicosities. Abdomen:  Bowel sounds positive; abdomen soft and non-tender without masses Msk:  unable to fully test. Unable to stand easily. Poor gait Pulses:  pulses normal in all 4 extremities Extremities:  No clubbing or cyanosis. Neurologic:  Alert and oriented x 3. Skin:  Intact without lesions or rashes. Psych:   Normal affect.   Impression & Recommendations:  Problem # 1:  EDEMA (ICD-782.3) her edema in the lower extremities is likely secondary to venous insufficiency/ possibly lymphedema. She has signs of chronic venous insufficiency with skin discoloration. Her BNP is normal, echocardiogram done earlier this year does not show fluid overload as her right ventricular systolic pressure is high normal.  We have suggested she could try TED hose below the knees. Leg elevation for above the knees. For severe symptoms, she could try Ace wraps. we did mention that she could seek a surgical consultation from vein and vascular her she is not interested.  Problem # 2:  PVD (ICD-443.9) she does have moderate carotid arterial disease 3 years ago. We have suggested she have a repeat carotid ultrasound. We'll schedule this for her.  Problem # 3:  CHRONIC KIDNEY DISEASE STAGE IV (SEVERE) (ICD-585.4) underlying renal disease. I suggested that she not push extra diuretic unless indicated by the renal service. The extra diuretic would likely not help her edema and could cause hypovolemia.  Problem # 4:  ATRIAL FIBRILLATION (ICD-427.31) she did mention having some palpitations and tachycardia with exertion. She takes her Toprol in the late afternoon. I suggested she change the timing and take this in the morning  Her updated medication list for this problem includes:    Lanoxin 0.125 Mg Tabs (Digoxin) .Marland Kitchen... Take 1 tablet by mouth daily    Toprol Xl 100 Mg Xr24h-tab (Metoprolol succinate) .Marland Kitchen... 1 daily by mouth in morning  Orders: EKG w/ Interpretation (93000)  Problem # 5:  HYPERLIPIDEMIA-MIXED (ICD-272.4) Given her underlying peripheral vascular disease, her LDL is above goal. Goal LDL should be close to or less than 70. We will change her to Crestor 20 mg daily for several weeks titrating up to 40 mg daily with a recheck of her cholesterol in 3 months time with LFTs.  The following medications were removed from  the medication list:    Lescol Xl 80 Mg Tb24 (Fluvastatin sodium) .Marland Kitchen... Take 1 tablet by mouth every night Her updated medication list for this problem includes:    Crestor 40 Mg Tabs (Rosuvastatin calcium) .Marland Kitchen... Take one tablet by mouth daily.  Other Orders: Carotid Duplex (Carotid Duplex)  Patient Instructions: 1)  Your physician recommends that you schedule a follow-up appointment in: 6 Months 2)  Your physician has recommended you make the following change in your medication: START Crestor 40mg  once daily (Take 1/2 tablet once daily x 2 weeks then resume 1 tablet once daily.) TAKE Metoprolol once daily in the MORNING. STOP Lescol. 3)  Your physician has requested that you have a carotid duplex. This test is an ultrasound of the carotid arteries in your  neck. It looks at blood flow through these arteries that supply the brain with blood. Allow one hour for this exam. There are no restrictions or special instructions. Prescriptions: CRESTOR 40 MG TABS (ROSUVASTATIN CALCIUM) Take one tablet by mouth daily.  #90 x 3   Entered by:   Lanny Hurst RN   Authorized by:   Dossie Arbour MD   Signed by:   Lanny Hurst RN on 02/27/2010   Method used:   Faxed to ...       [facility] education [facility])       [address] [address]       [place], [place]  [address]       Ph: [phone]       Fax: [phone]   RxID:   [id number]

## 2010-04-03 ENCOUNTER — Encounter: Payer: Self-pay | Admitting: Family Medicine

## 2010-04-16 ENCOUNTER — Ambulatory Visit (INDEPENDENT_AMBULATORY_CARE_PROVIDER_SITE_OTHER): Payer: Medicare Other | Admitting: Family Medicine

## 2010-04-16 ENCOUNTER — Encounter: Payer: Self-pay | Admitting: Family Medicine

## 2010-04-16 DIAGNOSIS — N319 Neuromuscular dysfunction of bladder, unspecified: Secondary | ICD-10-CM

## 2010-04-16 DIAGNOSIS — E119 Type 2 diabetes mellitus without complications: Secondary | ICD-10-CM

## 2010-04-16 DIAGNOSIS — M79609 Pain in unspecified limb: Secondary | ICD-10-CM

## 2010-04-17 NOTE — Letter (Signed)
Summary: [facility]  [facility]   Imported By: Lanelle Bal 04/11/2010 10:38:10  _____________________________________________________________________  External Attachment:    Type:   Image     Comment:   External Document  Appended Document: [facility]     Clinical Lists Changes  Observations: Added new observation of PAST MED HX: Asthma (02/24/1933) COPD (02/24/1986) Diabetes mellitus, type II (02/25/1987) Hypertension (02/24/1982) Hypothyroidism (02/24/1957) Hyperlipidemia (11/25/1994) Atrial fibrillation (04/24/1997) Subdural hematoma while on coumadin Nonobstructive carotid diease Spinal cord injury with chronic diability and neurogenic bladder Abdominal hernia Colon polyps R renal mass, followed by uro for 3 years w/o sig change, is felt to be benign, prev bx nondiagnositc CKD with baseline Cr 2.7 as of 03/2010 (04/11/2010 23:26)       Past History:  Past Medical History: Asthma (02/24/1933) COPD (02/24/1986) Diabetes mellitus, type II (02/25/1987) Hypertension (02/24/1982) Hypothyroidism (02/24/1957) Hyperlipidemia (11/25/1994) Atrial fibrillation (04/24/1997) Subdural hematoma while on coumadin Nonobstructive carotid diease Spinal cord injury with chronic diability and neurogenic bladder Abdominal hernia Colon polyps R renal mass, followed by uro for 3 years w/o sig change, is felt to be benign, prev bx nondiagnositc CKD with baseline Cr 2.7 as of 03/2010

## 2010-04-19 ENCOUNTER — Telehealth: Payer: Self-pay | Admitting: Family Medicine

## 2010-04-22 ENCOUNTER — Encounter: Payer: Self-pay | Admitting: Family Medicine

## 2010-04-23 NOTE — Assessment & Plan Note (Signed)
Summary: F/U HAND,DIABETES/CLE   MEDICARE/TRICARE   Vital Signs:  Patient profile:   77 year old female Height:      64 inches Weight:      239.13 pounds BMI:     41.19 Temp:     98 degrees F oral Pulse rate:   92 / minute Pulse rhythm:   regular BP sitting:   136 / 52  (left arm) Cuff size:   large  Vitals Entered By: Delilah Shan CMA  Dull) (April 16, 2010 11:08 AM) CC: F/U hand, DM   History of Present Illness: DM2- Actos was cut in half and sugars are still controlled. She feels better and has less lows.  We'll recheck in March as planned.  Sugars reviewed.    L thumb pain.  "If the cold touches my hand, it hurts".  Bracing didn't change anything.  Worse if temp outside if <50deg.  Occ pain in L palm and proximal L thumb.  It's not weak but it is painful with pressure on the nail.  No pain if pressure applied elsewhere on the thumb.  No trauma.    She wants to get second opinion on bladder symptoms.  H/o neurogenic bladder and wants second opinion.  She's wearing a pad and not using a catheter.  We discussed avoiding cath to prevent infections.  Noted h/o R renal mass, followed by uro for 3 years w/o sig change, is felt to be benign, prev bx nondiagnositc  Allergies: 1)  ! * Ambien 2)  ! Aspirin 3)  ! Baclofen 4)  ! * Cardizem Cd 5)  ! Celebrex 6)  ! Codeine 7)  ! * Coumadin 8)  ! Elavil 9)  ! * Klonopin 10)  ! * Ludomil 11)  ! * Methadone 12)  ! Neurontin 13)  ! Phenobarbital 14)  ! Prozac 15)  ! * Sinequan 16)  ! * Spiriva 17)  ! Talwin 18)  ! * Transene 19)  ! Vicodin 20)  ! Vioxx 21)  ! Xanax 22)  ! * Deseyrel 23)  ! Prinivil 24)  ! Levaquin 25)  ! Henriette Combs  Past History:  Past Medical History: Asthma (02/24/1933) COPD (02/24/1986) Diabetes mellitus, type II (02/25/1987) Hypertension (02/24/1982) Hypothyroidism (02/24/1957) Hyperlipidemia (11/25/1994) Atrial fibrillation (04/24/1997) Subdural hematoma while on coumadin Nonobstructive  carotid diease Spinal cord injury with chronic disability and neurogenic bladder Abdominal hernia Colon polyps R renal mass, followed by uro for 3 years w/o sig change, is felt to be benign, prev bx nondiagnositc CKD with baseline Cr 2.7 as of 03/2010  Review of Systems       See HPI.  Otherwise negative.    Physical Exam  General:  no apparent distress mucous membranes moist  IRR lungs clear to auscultation bilaterally L hand with normal inspection but hypersensitive/very tender if L 1st nail compressed.  No erythema, edema.  Normal ROM in the hand.  Normal perfusion   Impression & Recommendations:  Problem # 1:  THUMB PAIN, LEFT (ICD-729.5) Refer to hand center.  Given her allergies, intolerances, I didn't change meds.  She is going to keep the area warm.  I want to know what the hand center can do to desensitize the likey affected nerves in that finger.  I appreciate their input. Orders: Misc. Referral (Misc. Ref)  Problem # 2:  NEUROGENIC BLADDER DUE TO SPINAL INJ (ZOX-096.04) refer to Uro.  Orders: Urology Referral (Urology)  Problem # 3:  DIABETES MELLITUS, TYPE II (ICD-250.00)  no change in meds, will follow up next month after A1c.  Her updated medication list for this problem includes:    Lantus 100 Unit/ml Soln (Insulin glargine) ..... Inject 85 units subcutaneously every night    Actos 45 Mg Tabs (Pioglitazone hcl) .Marland Kitchen... 1/2 tab by mouth once daily    Humalog 100 Unit/ml Soln (Insulin lispro (human)) ..... Inject dose  per sliding scale subcutaneously as directed  Complete Medication List: 1)  Atrovent Hfa 17 Mcg/act Aers (Ipratropium bromide hfa) .... Inhale 2 puff using inhaler two times a day 2)  Lanoxin 0.125 Mg Tabs (Digoxin) .... Take 1 tablet by mouth daily 3)  Lantus 100 Unit/ml Soln (Insulin glargine) .... Inject 85 units subcutaneously every night 4)  Levothyroxine Sodium 112 Mcg Tabs (Levothyroxine sodium) .... One tab by mouth once daily 5)  Lasix 80  Mg Tabs (Furosemide) .... Take 2 tablets by mouth two  times a day 6)  Actos 45 Mg Tabs (Pioglitazone hcl) .... 1/2 tab by mouth once daily 7)  Humalog 100 Unit/ml Soln (Insulin lispro (human)) .... Inject dose  per sliding scale subcutaneously as directed 8)  Calcitriol 0.5 Mcg Caps (Calcitriol) .... Take one by mouth daily 9)  Bd Insulin Syringe Ultrafine 30g X 1/2" 1 Ml Misc (Insulin syringe-needle u-100) .... Use 4 times per day w/ insulin 10)  Bactroban 2 % Crea (Mupirocin calcium) .... Apply to area daily with dressing change 11)  Percocet 10-325 Mg Tabs (Oxycodone-acetaminophen) .... Take 2 by mouth four times a day with extra 2 for flare ups as needed 12)  Toprol Xl 100 Mg Xr24h-tab (Metoprolol succinate) .Marland Kitchen.. 1 daily by mouth in morning 13)  Symbicort 160-4.5 Mcg/act Aero (Budesonide-formoterol fumarate) .... 2 puffs daily 14)  Prevacid 20 Mg  .... One tablet once daily 15)  Valium 5 Mg Tabs (Diazepam) .... Take one by mouth 4 times a day 16)  Miralax Powd (Polyethylene glycol 3350) .... Daily 17)  Promethazine Hcl 25 Mg Tabs (Promethazine hcl) .... As needed 18)  Morphine Sulfate 15 Mg Tabs (Morphine sulfate) .... One or two tablets up to twice daily as needed, added to percocet. 19)  Crestor 40 Mg Tabs (Rosuvastatin calcium) .... One tabet at bedtime  Patient Instructions: 1)  See Shirlee Limerick about your referral before your leave today. Don't change your meds in the meantime.  Keep your appointment with me in March with labs before.  Take care.    Orders Added: 1)  Est. Patient Level III [69629] 2)  Misc. Referral [Misc. Ref] 3)  Urology Referral [Urology]    Current Allergies (reviewed today): ! * AMBIEN ! ASPIRIN ! BACLOFEN ! * CARDIZEM CD ! CELEBREX ! CODEINE ! * COUMADIN ! ELAVIL ! * KLONOPIN ! * LUDOMIL ! * METHADONE ! NEURONTIN ! PHENOBARBITAL ! PROZAC ! * SINEQUAN ! * SPIRIVA ! TALWIN ! * TRANSENE ! VICODIN ! VIOXX ! Prudy Feeler ! * DESEYREL ! PRINIVIL !  LEVAQUIN ! Roland Rack BOOT

## 2010-04-23 NOTE — Progress Notes (Signed)
----   Converted from flag ---- ---- 04/19/2010 12:45 PM, Carlton Adam wrote: Dr Para March, could not refer this patient for 2nd opinion Urology. 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 She will have her caseworker call the Urologist in [place] to schedule as they will need the approval to set anything up. MK ------------------------------  Noted.

## 2010-04-30 ENCOUNTER — Ambulatory Visit: Payer: Self-pay | Admitting: Orthopedic Surgery

## 2010-05-02 NOTE — Letter (Signed)
Summary: [facility]   Imported By: Kassie Mends 04/26/2010 09:23:43  _____________________________________________________________________  External Attachment:    Type:   Image     Comment:   External Document  Appended Document: [facility]     Clinical Lists Changes  Observations: Added new observation of PAST MED HX: Asthma (02/24/1933) COPD (02/24/1986) Diabetes mellitus, type II (02/25/1987) Hypertension (02/24/1982) Hypothyroidism (02/24/1957) Hyperlipidemia (11/25/1994) Atrial fibrillation (04/24/1997) Subdural hematoma while on coumadin Nonobstructive carotid diease Spinal cord injury with chronic disability and neurogenic bladder Abdominal hernia Colon polyps R renal mass, followed by uro for 3 years w/o sig change, is felt to be benign, prev bx nondiagnositc CKD with baseline Cr 2.7 as of 03/2010 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 Merlyn Lot 2012

## 2010-05-13 ENCOUNTER — Encounter: Payer: Self-pay | Admitting: Family Medicine

## 2010-05-13 ENCOUNTER — Other Ambulatory Visit: Payer: Self-pay | Admitting: Family Medicine

## 2010-05-13 ENCOUNTER — Ambulatory Visit (INDEPENDENT_AMBULATORY_CARE_PROVIDER_SITE_OTHER): Payer: Medicare Other | Admitting: Family Medicine

## 2010-05-13 DIAGNOSIS — M79609 Pain in unspecified limb: Secondary | ICD-10-CM

## 2010-05-13 DIAGNOSIS — E119 Type 2 diabetes mellitus without complications: Secondary | ICD-10-CM

## 2010-05-14 NOTE — Letter (Signed)
Summary: [facility]  [facility]   Imported By: Kassie Mends 05/08/2010 09:34:38  _____________________________________________________________________  External Attachment:    Type:   Image     Comment:   External Document

## 2010-05-23 NOTE — Assessment & Plan Note (Signed)
Summary: ROA FOR 3 MTH F/U/JRR   Vital Signs:  Patient profile:   77 year old female Height:      64 inches Weight:      242.25 pounds BMI:     41.73 Temp:     98.2 degrees F oral Pulse rate:   84 / minute Pulse rhythm:   regular BP sitting:   104 / 56  (left arm) Cuff size:   large  Vitals Entered By: Delilah Shan CMA  Dull) (May 13, 2010 11:12 AM) CC: 3 months follow up   History of Present Illness: Had follow up with hand center.  She is trying to decide what to do.  She doesn't want to have the surgery if it will affect her abilty to use a walker.   Diabetes:  Using medications without difficulties:yes Hypoglycemic episodes:no Hyperglycemic episodes:no Feet problems:at baseline Blood Sugars averaging:  ~100 in AM, slightly higher before lunch and dinner with 2h PP 140-170.  A1c done today. see notes on labs.   Allergies: 1)  ! * Ambien 2)  ! Aspirin 3)  ! Baclofen 4)  ! * Cardizem Cd 5)  ! Celebrex 6)  ! Codeine 7)  ! * Coumadin 8)  ! Elavil 9)  ! * Klonopin 10)  ! * Ludomil 11)  ! * Methadone 12)  ! Neurontin 13)  ! Phenobarbital 14)  ! Prozac 15)  ! * Sinequan 16)  ! * Spiriva 17)  ! Talwin 18)  ! * Transene 19)  ! Vicodin 20)  ! Vioxx 21)  ! Xanax 22)  ! * Deseyrel 23)  ! Prinivil 24)  ! Levaquin 25)  ! 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 Merlyn Lot 2012  Review of  Systems       See HPI.  Otherwise negative.    Physical Exam  General:  no apparent distress mucous membranes moist  IRR lungs clear to auscultation bilaterally L hand with normal inspection except for 1st nail banaged.     Diabetes Management Exam:    Foot Exam (with socks and/or shoes not present):       Sensory-Pinprick/Light touch:          Left medial foot (L-4): normal          Left dorsal foot (L-5): normal          Left lateral foot (S-1): normal          Right medial foot (L-4): diminished          Right dorsal foot (L-5): diminished          Right lateral foot (S-1): diminished       Sensory-Monofilament:          Left foot: normal          Right foot: diminished       Sensory-other: R foot changes at baseline due to back.        Inspection:          Left foot: normal  Right foot: normal       Nails:          Left foot: normal          Right foot: normal   Impression & Recommendations:  Problem # 1:  THUMB PAIN, LEFT (ICD-729.5) Continue with bandage, which makes the pain tolerable.  I will defer to patient and hand center.   Problem # 2:  DIABETES MELLITUS, TYPE II (ICD-250.00)  >25 min spent with patient, at least half of which was spent on counseling.  See notes on labs.  We worked to simplify her med list.  If A1c stays low, we may need to decrease the actos again later this summer.  She agreed wtih plan.  Her updated medication list for this problem includes:    Lantus 100 Unit/ml Soln (Insulin glargine) ..... Inject 70 units subcutaneously every morning    Actos 45 Mg Tabs (Pioglitazone hcl) .Marland Kitchen... 1/2 tab by mouth once daily    Humalog 100 Unit/ml Soln (Insulin lispro (human)) ..... Inject 20 units subcutaneously if sugar before meals is 115 or above  Orders: TLB-A1C / Hgb A1C (Glycohemoglobin) (83036-A1C)  Complete Medication List: 1)  Atrovent Hfa 17 Mcg/act Aers (Ipratropium bromide hfa) .... Inhale 2 puff using inhaler two times a day 2)   Lanoxin 0.125 Mg Tabs (Digoxin) .... Take 1 tablet by mouth daily 3)  Lantus 100 Unit/ml Soln (Insulin glargine) .... Inject 70 units subcutaneously every morning 4)  Levothyroxine Sodium 112 Mcg Tabs (Levothyroxine sodium) .... One tab by mouth once daily 5)  Lasix 80 Mg Tabs (Furosemide) .... Take 2 tablets by mouth two  times a day 6)  Actos 45 Mg Tabs (Pioglitazone hcl) .... 1/2 tab by mouth once daily 7)  Humalog 100 Unit/ml Soln (Insulin lispro (human)) .... Inject 20 units subcutaneously if sugar before meals is 115 or above 8)  Calcitriol 0.5 Mcg Caps (Calcitriol) .... Take one by mouth daily 9)  Bd Insulin Syringe Ultrafine 30g X 1/2" 1 Ml Misc (Insulin syringe-needle u-100) .... Use 4 times per day w/ insulin 10)  Bactroban 2 % Crea (Mupirocin calcium) .... Apply to area daily with dressing change 11)  Percocet 10-325 Mg Tabs (Oxycodone-acetaminophen) .... Take 2 by mouth four times a day with extra 2 for flare ups as needed 12)  Toprol Xl 100 Mg Xr24h-tab (Metoprolol succinate) .Marland Kitchen.. 1 daily by mouth in morning 13)  Symbicort 160-4.5 Mcg/act Aero (Budesonide-formoterol fumarate) .... 2 puffs daily 14)  Prevacid 20 Mg  .... One tablet once daily 15)  Valium 5 Mg Tabs (Diazepam) .... Take one by mouth 4 times a day 16)  Miralax Powd (Polyethylene glycol 3350) .... Daily 17)  Promethazine Hcl 25 Mg Tabs (Promethazine hcl) .... As needed 18)  Morphine Sulfate 15 Mg Tabs (Morphine sulfate) .... One or two tablets up to twice daily as needed, added to percocet. 19)  Crestor 40 Mg Tabs (Rosuvastatin calcium) .... One tabet at bedtime  Patient Instructions: 1)  We'll contact you with your lab report.  2)  I want to see you back in 3 months.   3)  Take care.  See the med change on your list.    Orders Added: 1)  Est. Patient Level IV [16109] 2)  TLB-A1C / Hgb A1C (Glycohemoglobin) [83036-A1C]    Current Allergies (reviewed today): ! * AMBIEN ! ASPIRIN ! BACLOFEN ! * CARDIZEM  CD ! CELEBREX ! CODEINE ! * COUMADIN ! ELAVIL ! Scarlette Calico ! *  LUDOMIL ! * METHADONE ! NEURONTIN ! PHENOBARBITAL ! PROZAC ! * SINEQUAN ! * SPIRIVA ! TALWIN ! * TRANSENE ! VICODIN ! VIOXX ! Prudy Feeler ! * DESEYREL ! PRINIVIL ! LEVAQUIN ! Roland Rack BOOT

## 2010-06-06 LAB — GLUCOSE, CAPILLARY: Glucose-Capillary: 96 mg/dL (ref 70–99)

## 2010-06-11 LAB — CBC
Hemoglobin: 12.4 g/dL (ref 12.0–15.0)
MCHC: 33.5 g/dL (ref 30.0–36.0)
MCV: 91.9 fL (ref 78.0–100.0)
RBC: 4.04 MIL/uL (ref 3.87–5.11)
WBC: 7.8 10*3/uL (ref 4.0–10.5)

## 2010-06-11 LAB — GLUCOSE, CAPILLARY: Glucose-Capillary: 146 mg/dL — ABNORMAL HIGH (ref 70–99)

## 2010-07-07 ENCOUNTER — Inpatient Hospital Stay: Payer: Self-pay | Admitting: Surgery

## 2010-07-09 NOTE — Assessment & Plan Note (Signed)
[facility] OFFICE NOTE   NAME:[name], Springer                       MRN:          [id number]  DATE:01/20/2007                            DOB:          September 11, 1931    Ms. Cusic comes today to establish with me as her Cardiologist.  She  has been a former long-time patient of Dr. Clarisa Kindred.   PROBLEM LIST:  1. Chronic atrial fibrillation.  2. History of subdural hematoma while on Coumadin, now no longer on      anticoagulation.  3. History of volume overload.  4. Status post spinal cord injury with chronic disability and      neurogenic bladder.  5. Nonobstructive carotid artery disease, currently asymptomatic.   MEDICATIONS:  Are typed out on the front of the chart and are extensive.   Her current cardiac issues are very minor.  She is relatively  asymptomatic except when she has pain, she feels her heart speed up a  little bit.  She has had no angina or ischemic symptoms.  She denies any  orthopnea, PND.  She has chronic 1+ pitting edema.  She is having no  symptoms of TIAs.   Her blood pressure today is 98/58.  Her pulse is 70 and irregular.  EKG  shows atrial fibrillation with no change.  She has low voltage.  Her  weight is 221.  HEENT:  Normocephalic/atraumatic, PERRLA, extraocular movements intact,  sclerae are clear, facial symmetry is normal.  Carotids reveal bilateral  bruits.  NECK:  Supple, thyroid is not enlarged, trachea is midline.  LUNGS:  Clear.  HEART:  Reveals a variable rate and rhythm, PMI is poorly appreciated.  ABDOMINAL:  Could not be adequately performed because she is sitting on  her motorized scooter.  EXTREMITIES:  Reveal 1+ pitting edema, there is no sign of DVT.  Dorsalis pedis pulses were 1+ over 4+ bilaterally symmetrical.  SKIN:  Unremarkable.   ASSESSMENT AND PLAN:  Ms. Joubert is stable from our standpoint.  We will  schedule her for followup in a year.  We will be  happy to see her back  at any time prior to that if she is having any troubles.  She will need  followup carotid Dopplers next year.     Thomas C. Daleen Squibb, MD, [facility]  Electronically Signed   TCW/MedQ  DD: 01/20/2007  DT: 01/20/2007  Job #: [id number]   cc:   [name], MD

## 2010-07-09 NOTE — Assessment & Plan Note (Signed)
[facility] OFFICE NOTE   [name], [name]                       MRN:          [id number]  DATE:03/07/2008                            DOB:          [birth year]    [name] comes in today for followup.  I established with her as my  patient on January 20, 2007.  She had been a former patient of Dr.  Jomarie Longs Guayama's.   PROBLEM LIST:  1. Chronic atrial fibrillation.  She occasionally will feel her heart      rate speed up, and she will feel a tightness just in the mid      sternal area which is very well localized.  She has no other      symptoms including nausea, vomiting, or diaphoresis.  She has no      known history of coronary disease though I suspect she does have      some coronary plaque.  2. History of subdural hematoma while on Coumadin.  No longer      anticoagulation candidate.  3. History of volume overload, currently stable.  She does take a      diuretic to keep her lower extremity swelling down.  A lot of this      from the immobility.  She has also had a spinal cord injury and has      a neurogenic bladder.  4. Nonobstructive carotid disease.  Last carotid Dopplers were stable.      She is asymptomatic.   Her meds today include:  1. Lasix 160 mg p.o. b.i.d.  2. AVA.  3. Humalog SS.  4. Percocet.  5. Valium.  6. Prinivil 5 mg per day.  7. Verapamil 120 mg per day.  8. Prevacid 20 mg per day.  9. Synthroid 112 mcg per day.  10.Lanoxin 0.125 mg per day.  11.Toprol-XL 50 mg per day.  12.Lescol 120 mg per day.  13.MiraLax 17 g daily.  14.Avandia 8 mg per day.  15.Atrovent b.i.d.  16.Lantus 110 units daily.   On exam, blood pressure 145/66, pulse is 89 and irregular.  EKG confirms  atrial fib.  No acute changes.  HEENT is unchanged.  Neck shows no JVD.  Thyroid is not enlarged.  Trachea is midline.  Carotids reveal no  significant bruit.  Lungs are clear.  Heart reveals an irregular  rate  and rhythm.  PMI was difficult to appreciate.  Abdominal exam could not  be done since she is sitting in a motorized wheelchair.  Extremities  reveal some 1+ edema in the pretibial and ankle areas.  Pulses were  present.  No sign of DVT.   Looking through her medical regimen, I have recommended we discontinue  the verapamil.  Though this is not causing any constipation, it could in  the future with her predisposing conditions.  We will increase her  Toprol to 100 mg per day to avoid the tachy palpitations and chest  tightness.  We will leave everything else as is.  I will plan on seeing  her back again  in 6 months.     Thomas C. Daleen Squibb, MD, [facility]  Electronically Signed    TCW/MedQ  DD: 03/07/2008  DT: 03/08/2008  Job #: [id number]

## 2010-07-09 NOTE — Assessment & Plan Note (Signed)
[facility] OFFICE NOTE   WANA, MOUNT                       MRN:          [id number]  DATE:09/05/2008                            DOB:          07/17/1931    Ms. Sockwell returns today for followup.  She has multiple complaints  including increasing swelling and edema and shortness of breath with  exertion.  She is confined largely to sitting most of the day.  She is  in a motorized car today.   She saw Dr. Hetty Ely, who recently adjusted her Lasix.  Her weight has  dropped from 255 down to 228.5 today and her swelling has improved  significantly.  She weighed 225 on her last visit here.   She denies any palpitations or syncope with her atrial fib.  She is  tolerating her medications well.   I note she is on Avandia which could be contributing some to the edema.  However, probably will not make a substantial difference.   Please see E-chart for her medications.   Her current dose of Lasix is 160 mg in the morning, 80 mg the afternoon.  Her Avandia dose is 8 mg.  She remains on Toprol-XL 100 mg per day,  Lanoxin 0.125 mg per day, Lescol 80 mg per day.  She is not a Coumadin  candidate and she has an allergy to ASPIRIN.   PHYSICAL EXAMINATION:  GENERAL:  Her exam today, she is elderly lady,  who is very immobile and chronically ill.  She is very pleasant.  VITAL SIGNS:  Her blood pressure is 174/74 in the left arm.  Her pulse  is 70 and irregular.  Her EKG today confirms atrial fib with low-  voltage.  There has been no acute changes.  Her weight is 228 up 3.  HEENT:  Essentially unremarkable.  NECK:  Shows no major JVD.  The neck is large.  Carotids are full with a  soft systolic sound in the right.  LUNGS:  Were clear to auscultation and percussion.  HEART:  Regular rate and rhythm, well-controlled rate.  No gallop.  ABDOMEN:  Soft, good bowel sounds.  Organomegaly cannot be adequately  assessed with  her sitting and her motorized car.  EXTREMITIES:  Show chronic edematous changes with 1+ pitting edema to 2+  pitting edema at her ankles.  Her pulses are intact.  NEURO:  Baseline.  SKIN:  Unremarkable.   ASSESSMENT AND PLAN:  Ms. Yeatman has had a significant loss of fluid  with adjustment in her Lasix by Dr. Hetty Ely.  I will make no further  adjustments today.  Her atrial fib worsening is asymptomatic.  Her  chronic shortness of breath is just from being so deconditioned and  having so many comorbidities.   I have noted that she is on Avandia which can cause fluid retention.  She says her hemoglobin A1c is running about 5.9%.  Perhaps Dr. Hetty Ely  can discontinue her Avandia.  We will plan on seeing her back in  Cardiology in 6 months.     Thomas C. Wall,  MD, [facility]  Electronically Signed    TCW/MedQ  DD: 09/05/2008  DT: 09/06/2008  Job #: [id number]

## 2010-07-12 NOTE — Procedures (Signed)
[facility]  Patient:    [name], [name]                       MRN: [id number] Adm. Date:  [id number] Attending:  Mervin Hack CC:         [name], M.D.   Procedure Report  PROCEDURE:  Upper endoscopy.  ENDOSCOPIST:  [name]. [name], M.D.  INDICATION:  This 77 year old white female with multiple medical problems, who has been wheelchair-ridden and on chronic morphine therapy for chronic low back pain, has had documented weight loss of 26 pounds in the last six months, with frequent episodes of nausea and vomiting necessitating emergency room evaluation approximately a month ago.  She has had GI evaluations in the past, last one in 1995, when a GI motility disorder was suspected by Dr. Vania Rea. Jarold Motto.  She is also a diabetic, on oral hypoglycemic agents. She has a positive family history of colon cancer in her mother.  She was admitted for evaluation of the weight loss; she is also anemic and has an elevated sed rate and WBC.  She is now undergoing upper and lower endoscopies with plans for further evaluation following endoscopic procedures including CT scan of the abdomen and pelvis.  ENDOSCOPE:  Olympus single-channel videoscope.  SEDATION:  Versed 15 mg IV, Demerol 170 mg IV.  FINDINGS:  Olympus single-channel videoscope was passed under direct vision through the posterior pharynx into the esophagus.  Patient was monitored by a pulse oximeter; her oxygen saturations were satisfactory.  Patient required a large amount of sedation.  Proximal, mid and distal esophageal mucosa was normal.  There was some retained oral anesthetic which was easily washed off. GE junction was normal.  Stomach:  Stomach was insufflated with air and showed fine nodularity over the body of the stomach, showing small 3- to 5-mm soft-appearing polyps; biopsies were taken.  There was a large amount of bile retained along the greater curvature of the  stomach.  Most of it was suctioned out, but there was no retained food.  Outlet from the stomach was wide open.  There was no evidence of obstruction.  CLOtest was taken from gastric antrum; patient has a history of H. pylori.  Duodenum:  Duodenal bulb was unremarkable; descending duodenum also appeared normal.  Biopsies were taken from descending duodenum as part of the evaluation of microcytic anemia.  The endoscope was then retracted and stomach was decompressed.  Patient tolerated the procedure well.  IMPRESSION 1. Bile reflux with retention. 2. Gastric polyps, status post biopsies. 3. Status post CLOtest. 4. Small bowel biopsy.  PROCEDURE:  Colonoscopy.  ENDOSCOPE:  Olympus single-channel video endoscope.  SEDATION:  No additional sedation.  FINDINGS:  Olympus single-channel videoscope was passed under direct vision into the rectum to the sigmoid colon.  Patient was again monitored by pulse oximeter.  She required a large amount of sedation.  Anal canal and rectal ampulla were normal.  There was some spasm throughout the sigmoid colon but mucosa appeared essentially normal.  There was no evidence of significant diverticulosis.  Descending colon was normal.  There was no stricture.  The splenic flexure, transverse colon and hepatic flexure were unremarkable.  It was very difficult to advance the colonoscope through, mostly due to patients previous abdominal operations.  The right colon and cecum were normal. Ileocecal valve appeared nodular and biopsies were taken from the top of the ileocecal valve.  The nodularity was rather soft  and not extensive.  Video photographs were obtained.  Endoscope was then slowly retracted from the right to the left colon.  Patient tolerated the procedure well.  IMPRESSION:  Soft-appearing nodularity over the ileocecal valve, status post biopsies; otherwise, normal exam.  PLAN:  Will await results of the biopsies and proceed with CT scan of  the abdomen and pelvis tomorrow; we are also evaluating anemia as to the etiology. Depending on results of CT scan, further evaluation of the elevated sed rate and leukocytosis may be done using a general internal medicine consultation.DD: 04/02/00 TD:  04/03/00 Job: [id number]

## 2010-07-12 NOTE — Discharge Summary (Signed)
NAME:  [name], MONSIVAIS                          ACCOUNT NO.:  [id number]   MEDICAL RECORD NO.:  [id number]                   PATIENT TYPE:  IPS   LOCATION:  4038                                 FACILITY:  [facility]   PHYSICIAN:  Erick Colace, M.D.           DATE OF BIRTH:  29-Apr-1931   DATE OF ADMISSION:  03/21/2003  DATE OF DISCHARGE:  04/04/2003                                 DISCHARGE SUMMARY   ADDENDUM:  At time of discharge, the patient had been on scheduled oxycodone  10 mg four times daily as well as oxycodone for breakthrough pain.  It was  at her request that since she was on Tylox prior to admission as needed that  she discontinue oxycodone - that was discussed with the patient and daughter  as well as the fact that the patient was on oral agents of Amaryl and  Glucophage prior to hospital admission, she was now on low doses of Lantus  insulin during her hospital stay.  Her blood sugars have normalized, her  oral agents were resumed, her Lantus insulin was discontinued at the time of  discharge, and she would follow up with her medical Perlita Forbush within 1 week.  She was advised to check her blood sugars four times daily at home and  monitor with followup with her primary care Lyberti Thrush.      Mariam Dollar, P.A.                     Erick Colace, M.D.    DA/MEDQ  D:  04/05/2003  T:  04/05/2003  Job:  [id number]

## 2010-07-12 NOTE — Op Note (Signed)
NAME:  [name], [name]                          ACCOUNT NO.:  [id number]   MEDICAL RECORD NO.:  [id number]                   PATIENT TYPE:  INP   LOCATION:  2103                                 FACILITY:  [facility]   PHYSICIAN:  Danae Orleans. [name], M.D.               DATE OF BIRTH:  1931/07/14   DATE OF PROCEDURE:  03/12/2003  DATE OF DISCHARGE:                                 OPERATIVE REPORT   PREOPERATIVE DIAGNOSIS:  Right temporal subdural hematoma.   POSTOPERATIVE DIAGNOSIS:  Right temporal subdural hematoma.   PROCEDURE:  Right temporal craniotomy and evacuation of subdural hematoma.   SURGEON:  Danae Orleans. [name], M.D.   ANESTHESIA:  General endotracheal anesthesia.   ESTIMATED BLOOD LOSS:  350 mL.   COMPLICATIONS:  None.   DISPOSITION:  Recovery.   INDICATIONS FOR PROCEDURE:  [name] is a 77 year old woman with  multiple medical problems who has been on Coumadin with an INR of 3.8 at  [facility] today. She had significant right temporal hematoma which  was felt by the radiologist there to be an epidural hematoma,  but was felt  by me to be a subdural hematoma. She was brought to [facility] and  given fresh frozen plasma to correct her coags and then subsequently was  taken to the operating room for evacuation of the subdural hematoma.   DESCRIPTION OF PROCEDURE:  Ms. Raimer was brought to the operating room.  Following the satisfactory and  uncomplicated induction of general  endotracheal anesthesia and placement of intravenous lines including a  central line and arterial line, she was placed in the supine position on the  operating table with the right side of her head elevated. The right frontal  temporoparietal scalp was then shaved, prepped and draped in the usual  sterile fashion. The area of planned incision was infiltrated with 0.25%  Marcaine and 0.5% Lidocaine with 1:200,000 epinephrine.   An incision was made in a reverse question mark fashion  and carried through  to the temporalis fascia which was incised with a scalpel and the muscle was  then cut with electrocautery. The scalp flap was elevated and then brought  forward, exposing low over the temporal squama.   Using the high-speed drill with acorn bits, 3 trephines were placed and 3  bur holes were placed, then the B1 bit with foot plate equivalent was  then  used to turn a bone flap. The dura was discolored bluish and fairly tense.  This was then incised with a #15 blade  and  this revealed a significant  amount of clotted, subdural blood. This was evacuated, stripped off the  dural membranes as well, and multiple fragments of clotted blood were  removed.   The middle fossa was then further  evacuated of residual  blood. The middle  fossa was then irrigated extensively with saline. Approximately 5 liters of  saline were  used to irrigate this area. Hemostasis was assured and the  exposed brain was then covered with Surgicel.   Hemostasis was again  assured. The dura was then closed in a non water tight  fashion with 4-0 Nurolon stitches. The dural tack-up stitches were placed. A  piece of Gelfoam was laid over the dura. The bone was replaced with Osteomed  plates. The galea was then reapproximated with 2-0 Vicryl sutures and the  temporalis fascia was reapproximated with 2-0 Vicryl sutures. The skin edges  were reapproximated with 3-0 nylon locked stitch and was dressed with a  sterile occlusive head wrap.   The patient was taken to the neurologic ICU in stable condition having  tolerated  her operation well. All counts were correct at the end of the  case.                                               Danae Orleans. [name], M.D.    JDS/MEDQ  D:  03/12/2003  T:  03/12/2003  Job:  [id number]

## 2010-07-12 NOTE — Discharge Summary (Signed)
NAME:  BARBI, KUMAGAI                          ACCOUNT NO.:  [id number]   MEDICAL RECORD NO.:  [id number]                   PATIENT TYPE:  INP   LOCATION:  3036                                 FACILITY:  [facility]   PHYSICIAN:  Danae Orleans. [name], M.D.               DATE OF BIRTH:  Jan 06, 1932   DATE OF ADMISSION:  03/11/2003  DATE OF DISCHARGE:  03/21/2003                                 DISCHARGE SUMMARY   REASON FOR ADMISSION:  Intracerebral hemorrhage.   ADDITIONAL DIAGNOSES:  1. Intracerebral hemorrhage.  2. Atrial fibrillation.  3. Chronic obstructive asthma.  4. Pleural effusion.  5. Respiratory failure.  6. Need for long term use of anticoagulant.  7. Subdural hemorrhage.  8. Polymyalgia rheumatica.  9. Type 2 diabetes.  10.      Neurogenic bladder.  11.      Migraines.  12.      Diaphragmatic hernia.  13.      Carpal tunnel syndrome.  14.      Cataracts.  15.      Anemia.   FINAL DIAGNOSES:  1. Intracerebral hemorrhage.  2. Atrial fibrillation.  3. Chronic obstructive asthma.  4. Pleural effusion.  5. Respiratory failure.  6. Need for long term use of anticoagulant.  7. Subdural hemorrhage.  8. Polymyalgia rheumatica.  9. Type 2 diabetes.  10.      Neurogenic bladder.  11.      Migraines.  12.      Diaphragmatic hernia.  13.      Carpal tunnel syndrome.  14.      Cataracts.  15.      Anemia.   HISTORY OF ILLNESS/HOSPITAL COURSE:  [name] is a 77 year old woman  with approximately a one week history of increasingly severe headaches with  multiple medical problems, who has been on Coumadin for atrial fibrillation.  Dr. Illene Silver is her primary doctor.  Dr. Cecil Cranker is her  cardiologist.  She went to [facility], on March 11, 2003, at which  point at CT scan demonstrated right epidural hematoma, but to my review  looked more consistent with a subdural hematoma with moderate mass effect on  the temporal lobe.  She was transferred to [facility] for further  care.  The PTU at [facility] ER was 36.9, with an INR 3.8.  She received 10 mg  of __________ in the emergency room, subsequently, received fresh frozen  plasma.  She has a significant past medical history with previous back  surgeries, also has diabetes, and atrial fibrillation, and polymyalgia  rheumatica.   PHYSICAL EXAMINATION:  NEUROLOGIC:  She had mild left hemiparesis with  slight pronator drift, but otherwise was doing reasonably well.  She was  quite somnolent but aroused with stimulation.   She underwent vigorous correction of her blood clotting with fresh frozen  plasma which did gradually improve.  She was seen  in conjunction with  critical care medicine service prior to her surgery.  It was elected to take  her to surgery for a temporal craniotomy on the right for a subdural  hematoma, and this was, in fact, found to be a subdural hematoma.  The  patient gradually was kept on a Diprivan drip to sedate her and remained on  the ventilator after surgery.  She was gradually weaned.  She had a  temperature to 101 on the 16th.  She became more awake and responsive on the  17th, and was following commands in all four extremities.  Her head CT  showed resolution of the hematoma and it was felt to be okay to extubate.  The patient gradually was mobilized and eventually had a swallowing study  which she passed.  She was seen by physical therapy, occupational therapy,  and was also evaluated by rehabilitation service, and was felt to be a good  candidate for inpatient rehabilitation.  She was, therefore, transferred to  the inpatient rehabilitation service on the 25th, having tolerated her  operation and hospitalization well.  Instructions to followup in my office  in three weeks.                                                Danae Orleans. [name], M.D.    JDS/MEDQ  D:  05/08/2003  T:  05/10/2003  Job:  [id number]

## 2010-07-12 NOTE — Discharge Summary (Signed)
[place]  Patient:    [name], [name]                       MRN: [id number] Adm. Date:  [id number] Disc. Date: [id number] Attending:  Mervin Hack Dictator:   Mike Gip, P.A.-C. CC:         Marvis Repress. Serita Butcher, M.D.  Isaias Cowman A. Resa Steele, M.D., [facility] [place],  [place], Department of Medicine/Psychiatry   Discharge Summary  ADMISSION DIAGNOSES:  54. A 77 year old white female with chronic pain syndrome, multifactorial, and     chronic narcotic use.  2. Weight loss, anorexia, and upper abdominal pain, suspect secondary to     chronic motility with autonomic neuropathy from diabetes complicated by     narcotics, resulting in chronic abdominal pain.  Cannot rule out     underlying malignancy.  3. Smoker with history of chronic obstructive pulmonary disease and asthma.  4. Chronic gastroesophageal reflux disease and history of peptic ulcer     disease.  5. Status post laminectomy x 2, then spinal fusion, and spinal cord injury     19 years ago with lower extremity polyneuropathy and chronic pain.  6. Neurogenic bladder secondary to spinal cord injury.  7. Status post subtotal thyroidectomy.  8. Status post cholecystectomy, appendectomy, hysterectomy, and ventral     hernia repair.  9. History of atrial fibrillation. 10. History of major depression and dependent personality. 11. Positive family history of colon cancer. 12. Adult onset diabetes mellitus. 13. Hyperlipidemia.  DISCHARGE DIAGNOSES:  1. Abdominal pain, bloating, and intermittent vomiting felt secondary to     intestinal hypomotility, multifactorial, with autonomic neuropathy from     diabetes and chronic narcotic use.  2. Gastric polyps, hyperplastic.  3. Mild bile reflux gastritis.  4. Smoker with history of chronic obstructive pulmonary disease and asthma.  5. Chronic gastroesophageal reflux disease and history of peptic ulcer     disease.  6. Status  post laminectomy x 2, then spinal fusion, and spinal cord injury     19 years ago with lower extremity polyneuropathy and chronic pain.  7. Neurogenic bladder secondary to spinal cord injury.  8. Status post subtotal thyroidectomy.  9. Status post cholecystectomy, appendectomy, hysterectomy, and ventral     hernia repair. 10. History of atrial fibrillation. 11. History of major depression and dependent personality. 12. Positive family history of colon cancer. 13. Adult onset diabetes mellitus. 14. Hyperlipidemia.  CONSULTATIONS:  None.  PROCEDURES: 1. Upper endoscopy with gastric biopsy and small bowel biopsy. 2. Colonoscopy with biopsy of the ileocecal valve. 3. CT scan of the abdomen and pelvis.  BRIEF HISTORY:  [name] is a pleasant, but complicated, 77 year old, white female, a primary patient of [name], M.D., also known to [name], M.D., at the [facility], who was recently evaluated by [name]. [name], M.D., for the above-mentioned complaints.  She said that she had been having intermittent vomiting as well, sometimes projectile and sometimes waking her up in the middle of the night.  She had dropped from her usual weight of 213 pounds to 187 pounds over the past six months and has complained of decreased appetite, post prandial bloating, and intermittent vomiting apparently previously resulting in dehydration.  For details, see the dictated H&P.  The patient has been maintained on chronic narcotics for years due to chronic back pain secondary to neuropathy.  She has also had chronic problems  with constipation and uses a multitude of laxatives on a regular basis.  Due to her neurogenic bladder, she also requires self-catheterization. Due to her debilitated state, it was felt that she was not going to be able to prep for endoscopy and colonoscopy at home and was therefore admitted to the hospital for prep and endoscopic  evaluation.  LABORATORY STUDIES:  On April 01, 2000, electrolytes within normal limits, glucose 129, BUN 11, creatinine 1.0, and albumin 3.1.  Liver function studies within normal limits.  Pro time 14.4, INR 1.2.  The CBC showed WBC 12.5, hemoglobin 11, hematocrit 32.8, and platelets 327.  Urinalysis negative. TSH 0.87.  The sedimentation rate was elevated at 87.  Iron studies are pending at the time of discharge as is B12, folate, and CEA.  X-RAY STUDIES:  CT scan on the abdomen done on April 03, 2000, shows two simple cysts of the left kidney, a 1 cm calcification in the splenic hilum, mild diffuse fatty infiltration of the liver, and a few prominent mesenteric lymph nodes, probably within normal limits.  Plain films on admission showed no significant abnormality of the abdomen or chest.  HOSPITAL COURSE:  The patient was admitted to the service of [name]. [name], M.D.  She was initially kept on a clear liquid diet.  She underwent GoLYTELY prep on the evening of admission and underwent colonoscopy the following morning with findings of some nodularity of the ileocecal valve, otherwise negative exam.  She had no significant diverticular disease noted.  She also underwent upper endoscopy which showed a couple 3-5 mm polyps in the stomach, which were biopsied with some evidence of bile retention.  A CLOtest was done and this was negative.  She also had small bowel biopsies which came back negative.  The patient tolerated the procedures without difficulty.  We resumed her diet the following morning.  She underwent CT scan of the abdomen and pelvis with findings as outlined above.  DISPOSITION:  She is at this time stable for discharge to home.  FOLLOW-UP:  She is discharged with instructions to follow up with [name]. [name], M.D., on May 15, 2000, at 9 a.m. and to call for any problems in the interim.  DISCHARGE MEDICATIONS:  All of her regular medications are to be resumed.   She  needs to hold the Glucophage for 72 hours post CT scan.  We have resumed her Coumadin at previous dosage and Prilosec 20 mg p.o. q.d. as previous.  I have asked her to use Gas-X as needed for bloating and started her on MiraLax 17 g in 8 ounces of water daily.  Other medications include MS Contin 30 mg t.i.d., Valium 5 mg q.i.d. p.r.n., Ambien 10 mg q.h.s. p.r.n., Prinivil 5 mg q.d., Synthroid 0.112 mcg q.d., Glucotrol XL 5 mg p.o. q.d., Glucophage 500 mg b.i.d. which will be resumed on Tuesday, April 07, 2000, Demadex 10 mg q.d. to b.i.d. p.r.n., potassium chloride 10 mEq b.i.d., Coumadin 7.5 mg q.d., Theo-Dur 300 mg t.i.d., Atrovent and albuterol inhalers, verapamil SR 120 mg b.i.d., Lipitor 10 mg q.d., Prilosec as above, Colace b.i.d., Senokot two q.a.m., and Ex-Lax p.r.n.  She was told to continue on Senokot for the next few days and then hopefully with the addition of MiraLax will be able to get off of Colace, Senokot, and Ex-Lax.  CONDITION ON DISCHARGE:  Stable. DD:  04/03/00 TD:  04/05/00 Job: [id number]

## 2010-07-12 NOTE — Discharge Summary (Signed)
[facility]  Patient:    [name], [name]                         MRN: [id number] Proc. Date: 04/10/00 Adm. Date:  04/05/00 Disc. Date: 04/10/00 Attending:  Justine Null, M.D. [facility]                           Discharge Summary  REASON FOR ADMISSION:  Intractable vomiting.  DISCHARGE DIAGNOSES: 1. Chronic pain syndrome due to osteoarthritis and due to a fall in the    past. 2. Complex gastroenterologic history, currently manifest by nausea and    vomiting, improved with a change in medication. 3. Atrial fibrillation with a slow ventricular rate, medication decreased. 4. Hypotension. 5. Polymyalgia rheumatica. 6. Constipation due to medications and due to bed rest. 7. Type 2 diabetes, glucose improved during this hospitalization. 8. Other chronic medical problems, not active during this hospitalization.  HISTORY OF PRESENT ILLNESS:  The patient is a 77 year old woman, admitted by Dr. [name] on April 05, 2000, with intractable vomiting.  Please refer to her dictated history and physical for details.  HOSPITAL COURSE:  The patient was admitted and evaluated by GI and the gastroenterology service felt that they had nothing further to offer the patient and care was assumed by internal medicine.  Regarding her vomiting, symptomatic therapy was offered in view of the fact that it was felt that further work-up would be of no value.  Phenergan and Reglan did not help her nausea so Zofran was started.  This was successful in stopping her nausea and by April 10, 2000, the patient was eating her usual diet.  The patient was also found to have a sedimentation rate of 87 and given her myalgias, a diagnosis of polymyalgia rheumatica was made.  This responded to prednisone.  Prednisone was being tapered at the time of discharge and should be tapered further after discharge.  With respect to the patients diabetes, her diabetes medications were  held during her hospitalization and glucose remained in the 100s.  It is assumed that the patients diet would become greater in the outpatient setting and that reinstitution of these medications would be necessary.  Regarding her chronic pain syndrome, the patient wanted to decrease her MS Contin from 30 mg twice a day to 15 mg twice a day to help with her nausea, and this was done successfully.  She states that the 15 mg twice a day still controls her pain.  At the time of discharge, she was given a prescription for MS Contin 15 mg twice a day, #60.  Regarding her chronic atrial fibrillation, she was followed by the Coumadin dosing service in the hospital and her INR remained therapeutic.  However, on the Calan, she had heart rates ranging down in the 40s, so the Calan SR was decreased from 120 mg a day to 60 mg daily.  The patient also continued to have quite a bit of constipation during her hospitalization and her MiraLax was restarted.  Finally, she was evaluated by physical therapy and it was felt to be a borderline call between placement in a skilled facility and her going home. I discussed this with the patient and the patient wished to go home.  I have told the patient that she will need some assist at home.  The patient does wish to go home.  CONDITION ON DISCHARGE:  She was discharged  home in fair condition on April 10, 2000.  DISCHARGE MEDICATIONS:  1. MS Contin 15 mg twice a day.  2. Calan SR one-half of a 120 mg tablet daily.  3. Valium 5 mg four times a day.  4. Atrovent two puffs four times daily.  5. MiraLax 17 g twice daily.  6. Zestril 10 mg daily.  7. Prilosec 20 mg daily.  8. Nicotine patch 7 mg, change daily.  9. Coumadin 7.5 mg daily. 10. Ambien 10 mg daily. 11. Prednisone 20 mg twice daily. 12. Zofran 2 mg 1-2 q.6h. as needed for nausea.  ACTIVITY:  The patient should be assisted when out of bed.  DIET:  Regular.  FOLLOWUP:  With Dr. Lovey Newcomer in  about two weeks. DD:  04/10/00 TD:  04/11/00 Job: [id number]

## 2010-07-12 NOTE — Assessment & Plan Note (Signed)
[facility]                              CARDIOLOGY OFFICE NOTE   MARYLENE, MASEK                       MRN:          [id number]  DATE:01/07/2006                            DOB:          10/10/1931    Ms. Carp is a very pleasant 77 year old disabled obese white female with  chronic atrial fibrillation, diabetes, volume overload, chronic disability  with neurogenic bladder secondary to spinal cord injury.   The patient has a history of subdural hematoma while on Coumadin, and thus  she no longer takes it. She was on Plavix, but her neurologist recently  discontinued that.   The patient has noted a 27-pound weight gain with increased fluid in her  legs. Dr. Hetty Ely has added Zaroxolyn and reduced the Lasix to 80 a day.   MEDICATIONS:  Are outlined by the patient.  1. Percocet.  2. Valium 10 q.i.d.  3. Prinivil 5.  4. Verapamil 120 twice a day.  5. Prevacid 20.  6. Lasix 80.  7. Potassium chloride 10.  8. Synthroid 0.112 mEq.  9. Glucophage 2 g daily.  10.Lanoxin 0.125.  11.Toprol-XL 50.  12.Lescol 120.  13.MiraLax.  14.Lantus insulin 100.  15.Avandia 8 mg daily.  16.Humalog.   The patient does state that she has had some recent problems with diabetes  control.   Blood pressure 112/62, pulse 96, atrial fibrillation.  GENERAL APPEARANCE:  Stable. JVP is not elevated.  LUNGS:  Clear.  CARDIAC EXAM:  Reveals no murmur.  EXTREMITIES:  Reveal 2+ edema to the knees.   EKG reveals atrial fibrillation, somewhat low voltage.   IMPRESSION:  Diagnosis as above.   Suggested decreasing salt intake further. Continue on the present therapy.  She is to see Dr. Hetty Ely in the near future. I should note that we have  preliminary report from her carotid Dopplers done today revealing 0 to 39%  stenosis on the right and 40 to 59% on the left. She has normal LV.   She lives about a half a block from our [place] office, and I have  suggested that she see Dr. Daleen Squibb to get established in 3 to 4 months. She  knows Dr. Daleen Squibb quite well as he cared for her mother in the past.     E. Graceann Congress, MD, [facility]  Electronically Signed    EJL/MedQ  DD: 01/07/2006  DT: 01/07/2006  Job #: [id number]   cc:   [name], MD

## 2010-07-12 NOTE — H&P (Signed)
NAME:  [name], [name]                          ACCOUNT NO.:  [id number]   MEDICAL RECORD NO.:  [id number]                   PATIENT TYPE:  INP   LOCATION:  3107                                 FACILITY:  [facility]   PHYSICIAN:  Rene Paci, M.D. [facility]          DATE OF BIRTH:  04/05/1931   DATE OF ADMISSION:  03/11/2003  DATE OF DISCHARGE:                                HISTORY & PHYSICAL   INTERNAL MEDICINE CONSULTATION:   HISTORY OF PRESENT ILLNESS:  Briefly, the patient is a 77 year old white  female who presented with a one week history of severe headache.  She went  to [facility] on the day of admission with head pain and decreased  level of consciousness and incoherent speech.  At [facility] she had  a head CT scan which was reported to show a right epidural hematoma.  This  was further reviewed with the neurosurgeon at [facility]  and he felt this was more likely a subdural hematoma with moderate mass  effect of the temporal lobe.  The patient was transferred to [facility] for further treatment.  On March 12, 2003 the patient  underwent craniotomy and evacuation of her subdural hematoma.  The patient  was briefly intubated and maintained on a mechanical ventilator. She has  successfully been extubated at this time.  Critical Care Medicine did manage  her while she was on the ventilator and have asked internal medicine to  assume management of her primary care issues at this time.   On my examination the patient is awake.  She is appropriate.  She does have  some mild dysarthric speech.  She denies any blurred vision or paresthesias.  She complains of feeling cold.   PAST MEDICAL HISTORY:  1. Polymyalgia rheumatica.  2. Gastric ulcers.  3. Asthma.  4. Chronic obstructive pulmonary disease.  5. Adult onset diabetes mellitus.  6. Neurogenic bladder secondary to a prior spinal injury.  7. Foot drop secondary to prior spinal  injury.  8. History of rheumatic fever.  9. Migraines with visual distortion.  10.      Hiatal hernia.  11.      Spastic esophagus.  12.      Bilateral carpal tunnel syndrome.  13.      History of pleural effusion and mycoplasma pneumonia.  14.      Bilateral cataracts.  15.      Arachnoiditis.  16.      Anemia.  17.      Hypertension.  18.      Osteoarthritis of both knees.  19.      Chronic atrial fibrillation with chronic anticoagulation.  20.      Lactose intolerance.  21.      Status post appendectomy.  22.      History of ovarian cyst X2.  23.      Status post complete hysterectomy.  24.      History of toxic goiter.  25.      Status post laminectomy of L4-L5.  26.      Left humerus chondroma.  27.      Status post bladder tacking.  28.      Status post cholecystectomy.  29.      History of spinal fusion at L4-L5 and S1.  30.      History of carpal tunnel repair on the left.  31.      History of abdominal hernia repair.   ALLERGIES:  CODEINE, VICODIN, TALWIN, SINEQUAN, DESYREL, ELAVIL, XANAX,  TRANXENE, METHADONE, LABETALOL , KLONOPIN, CARDIZEM, BACLOFEN, PROZAC,  NEURONTIN, PHENOBARBITAL, ASPIRIN, VIOXX and CELEBREX.   CURRENT MEDICATIONS:  1. Lisinopril 5 mg daily.  2. Albuterol nebulized q.i.d.  3. Atrovent nebulized q.i.d.  4. Calan 120 mg q.6h.  5. Dilantin 100 mg intravenous q.8h.  6. Lantus 20 units q.h.s.  7. Prevacid 30 mg daily.   PHYSICAL EXAMINATION:  VITAL SIGNS:  She is afebrile. Her temperature max  was 100.3.  Blood pressure 130/50.  Heart rate 70. Respirations 18.  Oxygen  saturation 100% on 35% oxygen.  GENERAL:  This is a lethargic, white female in no acute distress.  HEENT:  The right side of her head has been shaved and she does have a  surgical incision.  NECK:  Without jugular venous distention, adenopathy or carotid bruits.  LUNGS:  Clear without wheezes, rales or rhonchi.  CARDIOVASCULAR:  Regular.  ABDOMEN:  Bowel sounds are present,  soft and nontender.  EXTREMITIES:  Without edema although she does have bilateral PAS stockings  on.  NEUROLOGICAL:  The patient is alert.  She is oriented X3.  She is  appropriate but mildly dysarthric.  She does move all four extremities.   LABORATORY DATA:  White blood cell count 15.3, hemoglobin 9.2. Urinalysis  negative.  Blood cultures are negative X2.  BUN 18, creatinine 1.1.   IMPRESSION:  1. NEUROSURGERY:  Patient has a right subdural hematoma while on Coumadin.     She is status post craniotomy and evacuation.  2. INFECTIOUS DISEASE:  Patient had a low grade fever with a leukocytosis.     Urinalysis is negative.  Chest x-ray on admission showed left perihilar     infiltrate; it is not clear if her fever and white count are secondary to     pulmonary process versus her intracranial hemorrhage.  3. CARDIOVASCULAR:  As noted the patient has a history of chronic atrial     fibrillation.  Her anticoagulation was reversed with vitamin K and fresh     frozen plasma.  Currently her rate is controlled.  Determinations about     anticoagulation will need to be made in the future.  Currently she is     stable.  4. NORMOCYTIC ANEMIA:  The patient's hemoglobin dropped to 7.5 on March 11, 2003.  She did receive one unit of packed red blood cells and she is     currently hemodynamically stable.  It is not clear if her anemia was     from her bleed.  5. ADULT ONSET DIABETES MELLITUS:  This is currently stable.  We will     continue to monitor her.   PLAN:  We may need to consider empiric antibiotic therapy.  Stroke team  evaluation per neurosurgery.      Cornell Barman, P.A. [facility]  Rene Paci, M.D. [facility]    LC/MEDQ  D:  03/13/2003  T:  03/13/2003  Job:  [id number]   cc:   Laurita Quint, M.D.  [address] [place]  [place] [address]  Fax: [phone]

## 2010-07-12 NOTE — H&P (Signed)
[facility]  Patient:    Jill, Steele                       MRN: [id number] Adm. Date:  [id number] Attending:  Mervin Hack Dictator:   Mike Gip, P.A.-C. CC:         Laurita Quint, M.D.  Dr. Resa Miner, [facility], Dept. of Psychiatry   History and Physical  CHIEF COMPLAINT:  Nausea, vomiting, intractable x 24 hours with diarrhea.  HISTORY OF PRESENT ILLNESS:  [name] is a pleasant, complicated, 77 year old white female, primary patient of Dr. Hetty Ely, who was just discharged from [facility] 48 hours prior to this admission on April 03, 2000.  She had been admitted for further evaluation of weight loss of 26 pounds over the past six months associated with decreased appetite, postprandial abdominal bloating, and intermittent episodes of vomiting.  The patient has multiple medical problems including chronic atrial fibrillation for which she is on chronic Coumadin, hypertension, adult onset diabetes mellitus, hypothyroidism, hypertension, hyperlipidemia, COPD, asthma, prior history of gastric ulcers, chronic GERD, and colon polyps.  She has had multiple back surgeries initially with laminectomies and then a spinal fusion.  This was then complicated by a spinal cord injury after a fall approximately 19 years ago.  Since that time, she has had a lower extremity polyradiculopathy and neurogenic bladder, drop foot and immobility, currently wheelchair bound.  She is also status post subtotal thyroidectomy, cholecystectomy, appendectomy, hysterectomy, bilateral cataract extractions, and has a history of migraines.  The patient has been on chronic narcotics over the past 20 years due to chronic back pain and pain secondary to neuropathy.  She has also had chronic problems with constipation and has used a multitude of laxatives on a regular basis.  The patient was admitted to undergo work-up due to her debilitated state. While in the  hospital, she underwent upper endoscopy which showed some evidence of decreased gastric emptying with some bile reflux and a couple of small gastric polyps which were biopsied and hyperplastic.  CLOtest was done and was negative.  She also underwent colonoscopy which was negative with the exception of some nodularity at her ileocecal valve.  This was biopsied and was also nonspecific.  She underwent CT scan of the abdomen and pelvis which was negative with the exception of mild diffuse fatty infiltration of her liver and a 1 cm calcification in the splenic hilum and two simple renal cysts.  The patient also had small bowel biopsies done which were negative. It was felt that her symptoms were likely secondary to polypharmacy and decreased intestinal motility due to chronic narcotic use in addition to diabetes.  We advised that she see her psychiatrist back, Dr. Resa Miner, at [facility] for gradual weaning from her MS Contin and to see if there were any other options for her pain control.  We also placed her on MiraLax for her chronic constipation.  After being discharged home from the hospital, the patient self discontinued her MS Contin cold [place], and shortly thereafter began withdrawal symptoms and presents back to the emergency room at this time with intractable nausea and vomiting, sweats, diarrhea, and myalgias.  She was seen and evaluated by Dr. [name] in the emergency room and placed back in the hospital for medication withdrawal symptoms, and further diagnostic evaluation.  It was felt possibly that her symptoms could be psychogenic as well, due to the fact that she had just been in the hospital,  had undergone procedures and also had a Foley catheter while she was in the hospital, needed to rule out other etiologies for her symptoms including infection.  MEDICATIONS:  On discharge, February 8:  1. MS Contin 30 mg p.o. t.i.d.  2. Prilosec 20 mg q.a.m.  3. MiraLax 17 g in 8 ounces of  water daily.  4. Gas-X as needed.  5. She was to resume her Coumadin at 7.5 mg q.d. as previous.  6. She was to hold her Glucophage until April 07, 2000, post     CT scan.  7. Valium 5 mg q.i.d. p.r.n.  8. Ambien 10 mg q.h.s. p.r.n.  9. Prinivil 5 mg q.d. 10. Synthroid 0.112 q.d. 11. Glucotrol XL 5 mg q.d. 12. Glucophage 500 b.i.d. 13. Demadex 10 mg q.d. to b.i.d. p.r.n. 14. Potassium chloride 10 mEq b.i.d. 15. Theo-Dur 300 t.i.d. 16. Colace 100 b.i.d.  She was to try to come off of Colace, Senokot and     Ex-Lax and substitute MiraLax. 17. Atrovent inhaler two puffs t.i.d. 18. Albuterol inhaler two puffs t.i.d. 19. Verapamil SR 120 b.i.d. 20. Lipitor 10 q.d.  ALLERGIES:  Please see the recent dictated H&P.  She is allergic to RASPBERRIES with hives, also allergic to CODEINE and CODEINE derivatives with hives.  Also intolerant to SINEQUAN, DESYREL, ELAVIL, XANAX, TRANXENE, METHADONE, ______, all resulting in hives.  The patient states she had antephialtic shock with codeine, Baclofen, phenobarbital, Neurontin, and Prozac, all with slurred speech and various disorientation, and aspirin with GI upset.  Also reports hives with Cardizem.  PAST MEDICAL HISTORY:  As outlined above.  SOCIAL HISTORY:  The patient lives alone with her dog in a mobile home.  She lives in the same complex as her niece, states she is very close to her family.  She is a smoker, half-pack per day.  No ETOH and is ambulatory with a motorized wheelchair.  REVIEW OF SYSTEMS:  CARDIOVASCULAR:  Denied any chest pain or anginal symptoms.  PULMONARY:  Some chronic dyspnea with exertion.  No cough or sputum production.  GENITOURINARY:  Pertinent for neurogenic bladder.  The patient self catheterized three to four times daily.  She currently has no complaints of dysuria or urgency.  MUSCULOSKELETAL:  Pertinent for chronic back pain and pain secondary to neuropathy in her lower extremities.  GASTROINTESTINAL:   A  family history pertinent for colon cancer in the patients mother.  Also in the maternal side of the family had several musculoskeletal "diseases" includiNG muscular dystrophy and MS.  PHYSICAL EXAMINATION:  GENERAL:  A well-developed, white female in no acute distress, alert and oriented x 3.  VITAL SIGNS:  Blood pressure 160/78, pulse is 70, respirations 20, temperature is 99.  HEENT:  Atraumatic, normocephalic.  EOMI.  PERRLA.  Sclerae anicteric.  NECK:  Supple without nodes.  No JVD or bruit.  CARDIOVASCULAR:  Regular rate and rhythm with S1 and S2.  No murmur, rub or gallop.  PULMONARY:  Clear to A&P.  ABDOMEN:  Protuberant, obese, diffusely tender across the upper abdomen and no guarding or rebound.  She does relax when distracted.  Bowel sounds are hyperactive.  RECTAL:  No stool in the rectal vault.  Mucus is heme-negative.  EXTREMITIES:  No clubbing, cyanosis, or edema.  LABORATORY DATA:  On admission, WBC 16.5, hemoglobin 13.6, hematocrit of 39.5, MCV of 82.9, platelets of 457.  Remainder of the labs are pending on admission.  IMPRESSION: 60. A 77 year old white female with intractable nausea and  vomiting, probably    secondary to narcotic withdrawal, cannot rule out psychogenic component,    rule out infectious, i.e., urinary tract infection. 2. Polypharmacy. 3. Chronic atrial fibrillation, on chronic Coumadin. 4. Decreased intestinal motility secondary to diabetes and chronic narcotic    use. 5. Significantly elevated sedimentation rate, etiology unclear. 6. Chronic obstructive pulmonary disease and asthma. 7. Chronic gastroesophageal reflux disease. 8. History of peptic ulcer disease. 9. Status post spinal cord injury with lower extremity polyneuropathy    and chronic pain. 10. Neurogenic bladder secondary to spinal cord injury. 11. Status post subtotal thyroidectomy. 12. Status post cholecystectomy, appendectomy, hysterectomy, ventral hernia      repair. 13. History of major depression, independent personality. 14. Adult onset diabetes mellitus. 15. Smoker. 16. Hyperlipidemia.  PLAN:  The patient is readmitted to the service of Dr. Lina Sar.  She will be started back on her MS Contin at her usual dose.  Will cover her with IV fluids, antiemetics, check urinalysis, and blood and urine cultures.  Will obtain internal medicine consultation in a.m. and will also discuss her situation with her psychiatrist, Dr. Resa Miner, as it is felt that she would benefit from either completely coming off of the MS Contin or significantly decreasing her dose which will obviously need to be done in a tapered fashion. D:  04/06/00 TD:  04/07/00 Job: [id number]

## 2010-07-12 NOTE — H&P (Signed)
[facility]  Patient:    [name], [name]                       MRN: [id number] Adm. Date:  [id number] Attending:  Mervin Hack Dictator:   Mike Gip, P.A.-C. CC:         [name], M.D.   History and Physical  CHIEF COMPLAINT:  Abdominal pain, bloating, poor appetite and weight loss with intermittent vomiting.  HISTORY:  [name] is a complicated 77 year old white female, primary patient of Dr. [name], who was recently evaluated by Dr. [name]. [name] in the office for complaints of weight loss and intermittent vomiting.  She has dropped from a usual weight of 213 pounds to 187 pounds over the past six months and complains of a poor appetite, with bloating postprandially and intermittent episodes of vomiting, apparently resulting in dehydration.  She did have one visit to the ER in December of 2001 due to dehydration.  She does have multiple medical problems including atrial fibrillation, for which she is on chronic Coumadin, hypertension, adult-onset diabetes mellitus, on oral agents, hypothyroidism, hypertension, hyperlipidemia, COPD, asthma.  She has had prior history of gastric ulcers, has chronic GERD and also has had colon polyps.  She has had a psychiatric history with dependent personality and has had multiple back surgeries, initially with laminectomies and then spinal fusion; this was then complicated by a spinal cord injury from an accident which has resulted in lower extremity polyradiculopathy and neurogenic bladder, dropped foot and immobility, patient currently wheelchair bound.  She is status post subtotal thyroidectomy, cholecystectomy, appendectomy, hysterectomy, bilateral cataract extractions.  She also has a history of migraine headaches.  She is maintained on chronic narcotics due to chronic back pain and pain secondary to neuropathy.  She has had chronic problems with constipation and uses a multitude  of laxatives on a regular basis.  Due to her neurogenic bladder, she also requires self-catheterization.  Due to her debilitated state, it was felt that the patient was not going to be able to prep for required endoscopy and colonoscopy at home and is therefore admitted to undergo bowel prep with plans for colonoscopy and endoscopy in a.m.  If these are unremarkable, she will also undergo CT scan of the abdomen and pelvis while hospitalized for further evaluation of her abdominal pain and weight loss.  The patient has also had a history of ventral hernia which has required repair most recently by Dr. Currie Paris in 1996.  MEDICATIONS  1. MS Contin 30 mg t.i.d.  2. Valium 5 mg q.i.d. p.r.n.  3. Ambien 10 mg q.h.s. p.r.n.  4. Prinivil 5 mg q.d.  5. Synthroid 0.112 mcg p.o. q.d.  6. Glucotrol XL 5 mg p.o. q.d.  7. Glucophage 500 mg b.i.d.  8. Demadex 10 mg q.d. to b.i.d. p.r.n.  9. Kay Ciel 10 mEq b.i.d. 10. Coumadin 7.5 mg q.d. 11. Theo-Dur 300 mg t.i.d. 12. Colace 100 mg b.i.d. 13. Atrovent inhaler two puffs t.i.d. 14. Albuterol inhaler two puffs t.i.d. 15. Verapamil SR 120 mg b.i.d. 16. Lipitor 10 mg p.o. q.d. 17. Prilosec 20 mg q.d. 18. Colace b.i.d. 19. Senokot two q.a.m. 20. Ex-Lax p.r.n.  ALLERGIES:  Multiple medication allergies with secondary hives.  She is allergic to RASPBERRIES which also cause hives.  CODEINE AND CODEINE DERIVATIVES apparently with hives, also SINEQUAN, DESYREL, ELAVIL, XANAX, TRANXENE, METHADONE, LUDIOMIL all resulting in hives.  She said she had  anaphylactic shock with CODEINE.  BACLOFEN with slurred speech. PHENOBARBITAL, NEURONTIN and PROZAC all with slurred speech and disorientation and ASPIRIN with GI upset.  CARDIZEM also caused hives.  PAST MEDICAL HISTORY:  As outlined above.  SOCIAL HISTORY:  The patient lives alone with her dog.  She is very close to her niece, who cares for her.  She is a smoker of a half pack per day and  is trying to quit, no ETOH, and is ambulatory with a wheelchair.  REVIEW OF SYSTEMS:  CARDIOVASCULAR:  Denies any chest pain or anginal symptoms.  PULMONARY:  Pertinent for chronic dyspnea with exertion and cough and no current sputum production.  GENITOURINARY:  Pertinent for neurogenic bladder, self-catheterized three to four times per day.  MUSCULOSKELETAL: Pertinent for chronic back pain and pain secondary to neuropathy in her lower extremities.  GI:  As outlined above.  FAMILY HISTORY:  Her family history is pertinent for colon cancer in the patients mother.  PHYSICAL EXAMINATION (As per Dr. [name].)  GENERAL:  Patient is well-developed chronically ill white female in no acute distress.  She is alert and oriented x 3.  Obese.  HEENT:  Non-traumatic, normocephalic.  EOMI.  PERLA.  Sclerae anicteric.  NECK:  Supple without nodes.  No JVD or bruit.  CARDIOVASCULAR:  Regular rate and rhythm with S1 and S2.  PULMONARY:  Decreased breath sounds and a few expiratory wheezes and scattered rhonchi.  ABDOMEN:  Obese.  She is tender primarily in the epigastrium and just to the right of the epigastrium.  No palpable hepatosplenomegaly or mass.  She has several incisional scars.  Bowel sounds are active.  No abdominal bruit heard.  RECTAL:  Heme-negative in the office.  Large amount of stool present.  EXTREMITIES:  Without clubbing, cyanosis, or edema.  NEUROLOGIC:  Not done.  LABORATORY STUDIES:  Pending on admission.  IMPRESSION  1. [age]-year-old white female with chronic pain syndrome,     multifactorial, with chronic narcotic use.  2. Weight loss, anorexia and upper abdominal pain.  Suspect chronic     hypomotility of the gastrointestinal tract secondary to autonomic     neuropathy and/or narcotics resulting in chronic abdominal pain.  Cannot     rule out underlying malignancy.  3. Smoker with history of chronic obstructive pulmonary disease and asthma.   4. Chronic  gastroesophageal reflux disease and history of peptic ulcer     disease.  5. Status post laminectomy x 2, then spinal fusion and spinal cord injury     19 years ago with lower extremity polyneuropathy and chronic pain.  6. Neurogenic bladder secondary to spinal cord injury.  7. Status post subtotal thyroidectomy.  8. Status post cholecystectomy, appendectomy and hysterectomy and ventral     hernia repair.  9. History of atrial fibrillation, on Coumadin. 10. History of major depression and dependent personality disorder. 11. Positive family history of colon cancer. 12. Adult-onset diabetes mellitus. 13. Hyperlipidemia.  PLAN:  Patient is admitted to the service of Dr. Lina Sar.  She will undergo bowel prep with GoLYTELY and colonoscopy and endoscopy planned for April 02, 2000 in a.m.  If these are unrevealing, will plan to proceed with CT scan of the abdomen and pelvis while she is hospitalized, check baseline laboratory studies, and check PT and PTT, as patient has come off of Coumadin within the past five days.  Will continue the home medications.  For further details, please see the orders. DD:  04/01/00 TD:  04/02/00  Job: [id number]

## 2010-07-12 NOTE — Discharge Summary (Signed)
NAME:  DEMICA, [name]                          ACCOUNT NO.:  [id number]   MEDICAL RECORD NO.:  [id number]                   PATIENT TYPE:  IPS   LOCATION:  4038                                 FACILITY:  [facility]   PHYSICIAN:  Erick Colace, M.D.           DATE OF BIRTH:  [birth year]   DATE OF ADMISSION:  03/21/2003  DATE OF DISCHARGE:  04/04/2003                                 DISCHARGE SUMMARY   ANTICIPATED DISCHARGE DATE:  April 04, 2003   DISCHARGE DIAGNOSES:  1. Right temporal subdural hematoma, status post craniotomy March 12, 2003.  2. Dilantin procedure prophylaxis.  3. Chronic atrial fibrillation.  4. Chronic obstructive pulmonary disease.  5. Non-insulin-dependent diabetes mellitus.  6. Polymyalgia rheumatica.  7. Hypertension.  8. Chronic pain with a history of back surgery.  9. Neurogenic bladder.  10.      Gastroesophageal reflux disease.  11.      Hypothyroidism.  12.      Depression.   HISTORY OF PRESENT ILLNESS:  This is a 77 year old white female with a  history of chronic atrial fibrillation on chronic Coumadin who was admitted  on January 15 with altered mental status and headache.  On evaluation,  cranial CT scan showed a right temporal subdural hematoma.  INR on admission  of 3.8.  She underwent right temporal craniotomy evacuation of subdural  hematoma on January 16 per Dr. Danae Orleans. [name].  She was placed on Dilantin  procedure prophylaxis.  Coumadin remained on hold due to subdural hematoma.  Modified barium swallow and advanced to a regular diet.  She was close  supervision for bed mobility and minimal assistance for ambulation.  She was  admitted for comprehensive rehabilitation program.   PAST MEDICAL HISTORY:  1. Chronic atrial fibrillation on Coumadin therapy prior to admission,     followed by [facility].  2. Polymyalgia rheumatica.  3. Chronic obstructive pulmonary disease.  4. Non-insulin-dependent diabetes mellitus.  5.  Hypothyroidism.  6. Hypertension.  7. Back surgery times two with spinal cord injury and history of right foot     drop.  8. Neurogenic bladder.  9. Chronic pain syndrome.  She had been to [facility] in the past.   PAST SURGICAL HISTORY:  1. Bladder tacking.  2. Carpal tunnel release.  3. Appendectomy.  4. Hysterectomy.   ALLERGIES:  Multiple including CODEINE, VICODIN, TALWIN, SINEQUAN, DESYREL,  ELAVIL, XANAX, KLONOPIN, NONSTEROIDAL ANTI-INFLAMMATORIES, CARDIZEM,  METHADONE, BACLOFEN, PROZAC, NEURONTIN, PHENOBARBITAL, ASPIRIN, and  TRANXENE.   PRIMARY CARE PHYSICIAN:  Dr. Serita Butcher, [facility].   MEDICATIONS PRIOR TO ADMISSION:  1. Tylox as needed.  2. Valium 5 mg four times daily.  3. Prinvil 5 mg daily.  4. Prevacid daily.  5. Coumadin 5/7.5 mg alternated daily.  6. Lasix 80 mg daily.  7. K-Dur 20 mEq daily.  8. Synthroid 112 mcg daily.  9. Glucophage 1000 mg  daily.  10.      Amaryl 1 mg daily.  11.      Theo-Dur 300 mg three tablets daily.  12.      MiraLax daily.  13.      Lescol 120 mg daily.   SOCIAL HISTORY:  She lives alone in [place].  She used a scooter prior to  admission.  Independent with transfers.  She has a right AFO brace.  One  level home with a ramp.  Niece with assistance as needed on discharge.   HOSPITAL COURSE:  The patient with progressive gains while in rehabilitation  services with therapies initiated on a b.i.d. basis.  The following issues  were followed during the patient's rehabilitation course:  Pertaining to [name]'s right temporal subdural hematoma - Craniotomy on March 12, 2003.  Surgical site is healing nicely with follow-up per neurosurgery, Dr. Danae Orleans. [name].  Her functional mobility continued to improve and she was now  ambulating with a walker supervision 90 feet.  Endurance and fatigue factors  greatly improved.  She remained on Dilantin procedure prophylaxis.  Latest  Dilantin level of 9.5 on March 31, 2003.  This was adjusted accordingly  prior to day of discharge.  Follow Dilantin level.  She exhibited no seizure  activity.  Her Coumadin remained on hold due to subdural hematoma.  Cardiac  rate controlled and she was followed by [facility].  Her blood  sugars early on in the hospital course had some increased variables, thus  she was placed on Lantus insulin and her oral agents were discontinued.  Her  appetite continued to improve.  It was felt that at the time of discharge  she should continue on insulin therapy with all teaching being completed  with plan to resume oral agents in the future if possible.  Her blood  pressures remained controlled with no orthostatic changes.  She had a  history of chronic pain syndrome, back surgery, spinal cord injury and a  right foot drop, for which she was wearing foot brace prior to hospital  admission.  She was placed on scheduled oxycodone 10 mg four times daily  with relative good results.  Any change in mental status was monitored.  Her  Foley catheter tube had been removed with follow-up per Dr. Windy Fast L. Earlene Plater  of urology services for a questionable neurogenic bladder.  She was voiding  at this time quite nicely.  Her bladder scans were less than 150 cc.  Urinalysis study was negative.  She continued on her Prevacid for  gastroesophageal reflux disease.  She continued on her hormone supplement  for hypothyroidism.  Overall for her functional status, she was minimal  assist lower body bathing and dressing, contact artificial assist toilet  transfers, close supervision ambulation 97 feet with a rolling walker,  independent wheelchair mobility and using her scooter.  Home therapies have  been arranged.  She will also see Dr. Erick Colace at the [facility] concerning her latest therapy and pain  management.  LATEST LABS:  Dilantin level 9.5 on March 31, 2003.  Hemoglobin 11.4,  hematocrit  33.8 on January 26.  Sodium 140, potassium 4.4, BUN 16,  creatinine 0.9.   DISCHARGE MEDICATIONS:  1. Synthroid 113 mcg daily.  2. Prevacid daily.  3. MiraLax daily.  4. Lasix 80 mg one half tablet daily.  5. Potassium chloride 20 mEq daily.  6. Prinvil 5 mg daily.  7. Dilantin  200 mg in the a.m. and 300 mg at bedtime.  This will be adjusted     accordingly.  8. Verapamil 120 mg every six hours.  9. Lantus insulin 20 units at bedtime.  10.      Oxycodone 10 mg four times daily.  11.      Oxycodone 5 mg one or two tablets every four to six hours as needed     for pain.   ACTIVITY:  As tolerated, with foot brace.   DIET:  Diabetic diet.   SPECIAL INSTRUCTIONS:  1. No driving.  2. Home health physical therapy.  3. She should follow-up with Dr. Danae Orleans. [name], [phone], call for     appointment.  4. Dr. Erick Colace to follow-[facility].      Mariam Dollar, P.A.                     Erick Colace, M.D.    DA/MEDQ  D:  04/03/2003  T:  04/03/2003  Job:  [id number]   cc:   Erick Colace, M.D.  [address] [place]  [place] [address]  Fax: [phone]   Dr. Serita Butcher, M.D.  [facility]  [place] D. [name], M.D.  [address]  [place]  [place] [address]  Fax: [phone]   Lucrezia Starch. Ovidio Hanger, M.D.  [address]  [place]  [place] [address]  Fax: [phone]   [facility]

## 2010-07-12 NOTE — H&P (Signed)
NAME:  [name], [name]                          ACCOUNT NO.:  [id number]   MEDICAL RECORD NO.:  [id number]                   PATIENT TYPE:  INP   LOCATION:  2103                                 FACILITY:  [facility]   PHYSICIAN:  Danae Orleans. [name], M.D.               DATE OF BIRTH:  [birth year]   DATE OF ADMISSION:  03/11/2003  DATE OF DISCHARGE:                                HISTORY & PHYSICAL   REASON FOR ADMISSION:  Intracerebral hemorrhage on Coumadin.   HISTORY OF PRESENT ILLNESS:  [name] is a 77 year old woman with an  approximately one week history of increasingly severe headache with multiple  medical problems on Coumadin for atrial fibrillation.  Dr. Hetty Ely is her  primary doctor.  Dr. Glennon Hamilton is her cardiologist.  She went to [facility] today with head pain, a decreased level of consciousness, and was  speaking incoherently.  The patient had a head CT at [facility],  which showed what was reported to be a right epidural hematoma but which to  my review looks more consistent with a subdural hematoma with moderate mass  effect in the temporal lobe.  The patient was transferred to [facility] for further care.  A PT in [facility] today was 36.9 with an INR  of 3.8.  She received 10 mg of vitamin K in the emergency room.   PAST MEDICAL HISTORY:  1. Polymyalgia rheumatica.  2. Gastric ulcers.  3. Asthma.  4. COPD.  5. Diabetes.  6. Neurogenic bladder due to spinal injury.  7. Foot drop on the right due to spinal injury.  8. Rheumatic fever.  9. Migraines with visual distortion.  10.      Hiatal hernias.  11.      Spastic esophagus.  12.      Carpal tunnel both wrists.  13.      Pleural effusion.  14.      Mycoplasma pneumonia.  15.      Cataracts both eyes.  16.      Systolic murmur.  17.      Arachnoiditis.  18.      Anemia.  19.      Hypertension.  20.      Osteoarthritis both knees.  21.      Five pregnancies, two children.  22.       Atrial fibrillation with occasional PVCs.  23.      Lactose intolerance.   PAST SURGICAL HISTORY:  1. Appendectomy.  2. Removal of ovarian cysts x2.  3. Partial hysterectomy.  4. Complete hysterectomy.  5. Toxic goiter.  6. Laminectomy L4-5.  7. Chondroma left humerus.  8. Bladder tack.  9. Cholecystectomy.  10.      Spinal fusion L4-5 and S1.  11.      Carpal tunnel left.  12.      Cyst on the bladder.  13.      Colon polyps.  14.      Abdominal hernia and bowel protrusion.  15.      Evisceration of incision after hernia surgery.  16.      Abdominal hernia repair.   ALLERGIES:  Multiple medical allergies; these include:  1. CODEINE.  2. VICODIN.  3. TALWIN.  4. SINEQUAN.  5. DESYREL.  6. ELAVIL.  7. XANAX.  8. TRANXENE.  9. METHADONE.  10.      LUDIOMIL.  11.      KLONOPIN.  12.      CARDIZEM.  13.      BACLOFEN.  14.      PROZAC.  15.      NEURONTIN.  16.      PHENOBARBITAL.  17.      ASPIRIN.  18.      VIOXX.  19.      CELEBREX.   MEDICATIONS:  1. Percocet 10/325 mg 2 pills 4 times daily.  2. Valium 5 mg 4 times daily.  3. Prinivil 5 mg once daily.  4. Verapamil 120 mg 2 tablets in the morning, 2 tablets in the evening.  5. Prevacid 20 mg once in the morning.  6. Coumadin 5 mg-7.5 mg daily.  7. Lasix 80 mg daily.  8. Potassium chloride 10 mEq 2 tablets daily.  9. Synthroid 112 mcg daily.  10.      Glucophage 500 mg 2 tablets daily.  11.      Amaryl 1 mg daily.  12.      Theo-Dur 300 mg 3 tablets daily.  13.      MiraLax 17 g every 8 hours.  14.      Lescol XL 120 mg 1 tablet daily.   P.R.N. MEDICATIONS:  1. Albuterol inhaler 17 g 2 puffs daily.  2. Atrovent inhaler 14 g 2 puffs daily.  3. Ambien 10 mg 1 in the afternoon.  4. Benadryl 50 mg as needed.  5. Anusol suppository as needed.  6. Anusol cream as needed.  7. Tylenol 500 mg as needed.  8. Zofran 4 mg as needed.  9. Phenergan 25 mg as needed.  10.      Gas-X as needed.  11.      Mylanta as  needed.  12.      Iron infusion as needed.  13.      Procrit injections as needed.   PHYSICAL EXAMINATION:  VITAL SIGNS:  Temperature of 100.7, blood pressure of  170/70, pulse of 90, respiratory rate of 22.  GENERAL:  She is an obese, somnolent, but arousable woman.  She states her  name and age.  She thinks she is in [facility].  HEENT:  She opens her eyes.  Pupils equal, round, and reactive to light.  Extraocular movements intact.  Face is symmetric.  Hearing is intact.  Tongue is midline.  She complains of pain in her right temple, which does  not appear to be causing her a great deal of discomfort.  NEUROLOGIC:  She has a mild left hemiparesis with slight pronator drift and  weakness in her left hand intrinsics and finger extensors.  Lower extremity  strength is somewhat difficult to assess but is greater than antigravity in  the proximal lower extremity musculature.  Weak in the quadriceps but with  fairly good plantar dorsiflexion in both lower extremities.  Decreased right  dorsiflexors compared to the left.  The reflexes are 2 in the biceps  and  triceps, 1 at the knees, 1 at the ankles.  Great toes are upgoing  bilaterally.  CHEST:  Clear to auscultation.  HEART:  Regular rate and rhythm.  ABDOMEN:  Obese, nontender.  Active bowel sounds.  No hepatosplenomegaly  appreciated.  EXTREMITIES:  Without clubbing, cyanosis, or edema.   IMPRESSION:  [name] is a 77 year old woman with a coagulative  prothrombin time of 36.9, INR of 30.8 with a large right temporal hematoma,  subdural hematoma versus epidural hematoma.  Recommend correct coagulations  urgently with four units of __________ now, may require six units of  __________, vitamin K given.  Will give additional dosing.  She will need  her neurologic status followed closely with hourly neurologic checks.  She will likely need a craniotomy for hematoma.  Dr. Delford Field was consulted to  help manage her multiple medical  problems.                                                Danae Orleans. [name], M.D.    JDS/MEDQ  D:  03/11/2003  T:  03/11/2003  Job:  [id number]

## 2010-07-20 ENCOUNTER — Encounter: Payer: Self-pay | Admitting: Family Medicine

## 2010-07-20 DIAGNOSIS — K56609 Unspecified intestinal obstruction, unspecified as to partial versus complete obstruction: Secondary | ICD-10-CM | POA: Insufficient documentation

## 2010-07-26 ENCOUNTER — Ambulatory Visit (INDEPENDENT_AMBULATORY_CARE_PROVIDER_SITE_OTHER): Payer: Medicare Other | Admitting: Family Medicine

## 2010-07-26 ENCOUNTER — Encounter: Payer: Self-pay | Admitting: Family Medicine

## 2010-07-26 DIAGNOSIS — E119 Type 2 diabetes mellitus without complications: Secondary | ICD-10-CM

## 2010-07-26 DIAGNOSIS — K56609 Unspecified intestinal obstruction, unspecified as to partial versus complete obstruction: Secondary | ICD-10-CM

## 2010-07-26 NOTE — Progress Notes (Signed)
Hospital recs reviewed.  SBO, treated with NG tube suction, avoided surgery.  No fevers.  She had been off the miralax.  Now doing well with normal BMs at home and tolerating bland/soft diet.  No abd pain.  No vomiting.    Off all insulin and actos in the meantime.    Asking about restarting these meds.  Sugars reviewed, Usually ~100 in AM, ~130 2h PP.    Meds, vitals, and allergies reviewed.   ROS: See HPI.  Otherwise, noncontributory.  Nad, in W/c ncat Mmm rrr ctab abd soft, obese, not ttp Ankles with edema improved from typical exam

## 2010-07-26 NOTE — Patient Instructions (Signed)
Gradually add back some regular food.  Keep taking the miralax. I would start with 10units of lantus at night if your morning sugar is above 120.  I would keeping adding 10 units a day as long as your AM sugar is above 120.  Use your regular sliding scale as needed. Hold off on the actos for now.   I would not change your lasix now, but let me know if you have more swelling or weight gain.  Call me with an update on Monday (AM sugar, swelling, lantus dose).

## 2010-07-28 ENCOUNTER — Encounter: Payer: Self-pay | Admitting: Family Medicine

## 2010-07-28 DIAGNOSIS — K56609 Unspecified intestinal obstruction, unspecified as to partial versus complete obstruction: Secondary | ICD-10-CM | POA: Insufficient documentation

## 2010-07-28 NOTE — Assessment & Plan Note (Addendum)
Use regular sliding scale for humalog and gradually add back the lantus up to 10 unit inc per day---- I would start with 10units of lantus at night if your morning sugar is above 120.  I would keeping adding 10 units a day as long as your AM sugar is above 120.  Use your regular sliding scale as needed. Hold off on the actos for now.   I would not change your lasix now, but let me know if you have more swelling or weight gain.  Call me with an update on Monday (AM sugar, swelling, lantus dose). >25 min spent with face to face with patient >50% counseling.

## 2010-07-29 ENCOUNTER — Telehealth: Payer: Self-pay | Admitting: Family Medicine

## 2010-07-29 MED ORDER — FUROSEMIDE 20 MG PO TABS: 20.0000 mg | ORAL_TABLET | Freq: Two times a day (BID) | ORAL | Status: DC

## 2010-07-29 MED ORDER — INSULIN GLARGINE 100 UNIT/ML ~~LOC~~ SOLN: SUBCUTANEOUS | Status: DC

## 2010-07-29 MED ORDER — PIOGLITAZONE HCL 45 MG PO TABS: ORAL_TABLET | ORAL | Status: DC

## 2010-07-29 NOTE — Telephone Encounter (Signed)
I called pt and reviewed her numbers.  Eating more solid foods.  Normal BMs.   Will continue lantus adjustment by 10 units a day--10units of lantus at night if your morning sugar is above 120. Keep adding 10 units a day as long as your AM sugar is above 120.   Will take a total of lasix 40mg  a day (20mg  x2 tabs).  Actos is held.  She'll call back with update on Thursday.

## 2010-08-01 ENCOUNTER — Telehealth: Payer: Self-pay | Admitting: *Deleted

## 2010-08-01 MED ORDER — FUROSEMIDE 20 MG PO TABS: ORAL_TABLET | ORAL | Status: DC

## 2010-08-01 NOTE — Telephone Encounter (Signed)
I called pt.  She had some weeping from the leg, but it was clear. Leg edema improved now with elevation.  Will continue to adjust lantus dose and will inc lasix to 20mg , 2 in AM and 1 in PM.  She'll call back with update on Monday.

## 2010-08-01 NOTE — Telephone Encounter (Signed)
Patient called and said she faxed over the results for your review. She said you were supposed to call her this afternoon once you have had a chance to review them. She also said her left leg started draining clear fluid last night and she has a 4 pound change in her weight. She was wondering about a change in her lasix dose. She said she would await your return call.

## 2010-08-07 ENCOUNTER — Telehealth: Payer: Self-pay | Admitting: Family Medicine

## 2010-08-07 NOTE — Telephone Encounter (Signed)
I called pt.  See scanned forms.  Doing well, no change in meds.  She agrees.

## 2010-08-13 ENCOUNTER — Ambulatory Visit: Payer: Medicare Other | Admitting: Family Medicine

## 2010-08-13 DIAGNOSIS — Z0289 Encounter for other administrative examinations: Secondary | ICD-10-CM

## 2010-08-14 ENCOUNTER — Other Ambulatory Visit: Payer: Self-pay | Admitting: Family Medicine

## 2010-08-15 ENCOUNTER — Encounter: Payer: Self-pay | Admitting: Cardiovascular Disease

## 2010-08-23 ENCOUNTER — Other Ambulatory Visit: Payer: Self-pay | Admitting: *Deleted

## 2010-08-23 NOTE — Telephone Encounter (Signed)
Pt needs 3 month supply [facility] and she wants #15 sent to [facility].

## 2010-08-25 MED ORDER — DIGOXIN 125 MCG PO TABS: 125.0000 ug | ORAL_TABLET | Freq: Every day | ORAL | Status: DC

## 2010-08-27 ENCOUNTER — Ambulatory Visit: Payer: Medicare Other | Admitting: Cardiovascular Disease

## 2010-09-12 ENCOUNTER — Encounter: Payer: Self-pay | Admitting: Cardiovascular Disease

## 2010-09-17 ENCOUNTER — Ambulatory Visit: Payer: Medicare Other | Admitting: Cardiovascular Disease

## 2010-09-18 ENCOUNTER — Encounter: Payer: Self-pay | Admitting: Cardiovascular Disease

## 2010-09-18 ENCOUNTER — Ambulatory Visit (INDEPENDENT_AMBULATORY_CARE_PROVIDER_SITE_OTHER): Payer: Medicare Other | Admitting: Cardiovascular Disease

## 2010-09-18 VITALS — BP 148/64 | HR 88 | Ht 64.0 in | Wt 224.0 lb

## 2010-09-18 DIAGNOSIS — I10 Essential (primary) hypertension: Secondary | ICD-10-CM

## 2010-09-18 DIAGNOSIS — I739 Peripheral vascular disease, unspecified: Secondary | ICD-10-CM

## 2010-09-18 DIAGNOSIS — E119 Type 2 diabetes mellitus without complications: Secondary | ICD-10-CM

## 2010-09-18 DIAGNOSIS — E785 Hyperlipidemia, unspecified: Secondary | ICD-10-CM

## 2010-09-18 DIAGNOSIS — Z79899 Other long term (current) drug therapy: Secondary | ICD-10-CM

## 2010-09-18 DIAGNOSIS — R609 Edema, unspecified: Secondary | ICD-10-CM

## 2010-09-18 DIAGNOSIS — N184 Chronic kidney disease, stage 4 (severe): Secondary | ICD-10-CM

## 2010-09-18 DIAGNOSIS — J4489 Other specified chronic obstructive pulmonary disease: Secondary | ICD-10-CM

## 2010-09-18 DIAGNOSIS — J449 Chronic obstructive pulmonary disease, unspecified: Secondary | ICD-10-CM

## 2010-09-18 DIAGNOSIS — I4891 Unspecified atrial fibrillation: Secondary | ICD-10-CM

## 2010-09-18 MED ORDER — CLOPIDOGREL BISULFATE 75 MG PO TABS: 75.0000 mg | ORAL_TABLET | Freq: Every day | ORAL | Status: DC

## 2010-09-18 MED ORDER — METOPROLOL SUCCINATE ER 100 MG PO TB24: 100.0000 mg | ORAL_TABLET | Freq: Every day | ORAL | Status: DC

## 2010-09-18 NOTE — Assessment & Plan Note (Signed)
Edema has significantly improved with recent weight loss, leg elevation and diuresis. No further changes made to her medications.

## 2010-09-18 NOTE — Assessment & Plan Note (Signed)
We have encouraged continued exercise, careful diet management in an effort to lose weight. 

## 2010-09-18 NOTE — Assessment & Plan Note (Signed)
Heart rate is well controlled on her current medication regimen. No medication changes made.

## 2010-09-18 NOTE — Assessment & Plan Note (Signed)
Blood pressure is well controlled on today's visit. No changes made to the medications. 

## 2010-09-18 NOTE — Progress Notes (Signed)
Patient ID: [name], female    DOB: 1931/04/21, 77 y.o.   MRN: [id number]  HPI Comments: Ms Solazzo he is a 77 year old woman with chronic atrial fibrillation, long smoking history, history of subdural hematoma while on warfarin (No followup with neurology in 7 years), chronic renal insufficiency on diuretics with history of spinal cord injury and neurogenic bladder, peripheral vascular disease with 40-59% left internal carotid arterial disease in 2008 who presents for routine followup.   She was recently admitted to [facility] for followup structure and managed medically. She lost 26 pounds during her hospital course. It was felt her structure was secondary to pain medications and scar tissue. She now takes MiraLax daily and has no problems. After her lung. Leg elevation and weight loss, her leg swelling is significantly better.  She does not do any exercise.  She did stop smoking 6 years ago but does have a long smoking history.   she is able to walk around her trailer with her walker though uses a scooter when she leaves he trailer. She denies any significant chest pain. With exertion, sometimes she has tachycardia palpitations. She takes her metoprolol in the late afternoon.   EKG shows atrial fibrillation with ventricular rate 74 beats per minute, no significant ST or T wave changes      Outpatient Encounter Prescriptions as of 09/18/2010  Medication Sig Dispense Refill  . budesonide-formoterol (SYMBICORT) 160-4.5 MCG/ACT inhaler Inhale 2 puffs into the lungs 2 (two) times daily.        . calcitRIOL (ROCALTROL) 0.5 MCG capsule Take 0.5 mcg by mouth daily.        . diazepam (VALIUM) 5 MG tablet Take 5 mg by mouth every 6 (six) hours as needed.        . digoxin (LANOXIN) 0.125 MG tablet Take 1 tablet (125 mcg total) by mouth daily.  15 tablet  0  . furosemide (LASIX) 80 MG tablet 80 mg. 160 mg in the AM, 80 mg in the PM      . insulin glargine (LANTUS) 100 UNIT/ML injection 30 units of  lantus at night if AM sugar is above 120.  Add 10 units a day as long as AM sugar is >120.       Marland Kitchen insulin lispro (HUMALOG) 100 UNIT/ML injection Inject into the skin. Per sliding scale       . Insulin Syringe-Needle U-100 (B-D INS SYRINGE 0.5CC/30GX1/2") 30G X 1/2" 0.5 ML MISC Use 4 times daily with insulin       . ipratropium (ATROVENT HFA) 17 MCG/ACT inhaler Inhale 2 puffs into the lungs 2 (two) times daily.        . Lansoprazole (PREVACID PO) Take 20 mg by mouth 1 day or 1 dose.        . levothyroxine (SYNTHROID, LEVOTHROID) 112 MCG tablet Take 112 mcg by mouth daily.        . metoprolol (TOPROL-XL) 100 MG 24 hr tablet Take 1 tablet (100 mg total) by mouth daily.  90 tablet  4  . morphine (MSIR) 15 MG tablet 1-2 tablets up to twice daily as needed added to Percocet       . mupirocin (BACTROBAN) 2 % cream Apply to area daily with dressing change       . oxyCODONE-acetaminophen (PERCOCET) 10-325 MG per tablet Take 2 tablets by mouth four times a day with extra 2 for flare ups as needed       . polyethylene glycol (MIRALAX) powder  Take 17 g by mouth daily.        . rosuvastatin (CRESTOR) 40 MG tablet Take 40 mg by mouth. At bedtime          Review of Systems  Constitutional: Positive for unexpected weight change.  HENT: Negative.   Eyes: Negative.   Respiratory: Negative.   Cardiovascular: Positive for leg swelling.  Gastrointestinal: Negative.   Musculoskeletal: Negative.   Skin: Negative.   Neurological: Negative.   Hematological: Negative.   Psychiatric/Behavioral: Negative.   All other systems reviewed and are negative.    BP 148/64  Pulse 88  Ht 5\' 4"  (1.626 m)  Wt 224 lb (101.606 kg)  BMI 38.45 kg/m2  Physical Exam  Nursing note and vitals reviewed. Constitutional: She is oriented to person, place, and time. She appears well-developed and well-nourished.       Presents in a wheelchair. Obese.  HENT:  Head: Normocephalic.  Nose: Nose normal.  Mouth/Throat: Oropharynx  is clear and moist.  Eyes: Conjunctivae are normal. Pupils are equal, round, and reactive to light.  Neck: Normal range of motion. Neck supple. No JVD present. Carotid bruit is present.  Cardiovascular: Normal rate, S1 normal, S2 normal and intact distal pulses.  An irregularly irregular rhythm present. Exam reveals no gallop and no friction rub.   Murmur heard.  Crescendo systolic murmur is present with a grade of 2/6       Trace bilateral leg edema  Pulmonary/Chest: Effort normal and breath sounds normal. No respiratory distress. She has no wheezes. She has no rales. She exhibits no tenderness.  Abdominal: Soft. Bowel sounds are normal. She exhibits no distension. There is no tenderness.  Musculoskeletal: Normal range of motion. She exhibits no edema and no tenderness.  Lymphadenopathy:    She has no cervical adenopathy.  Neurological: She is alert and oriented to person, place, and time. Coordination normal.  Skin: Skin is warm and dry. No rash noted. No erythema.  Psychiatric: She has a normal mood and affect. Her behavior is normal. Judgment and thought content normal.         Assessment and Plan

## 2010-09-18 NOTE — Assessment & Plan Note (Signed)
Moderate carotid arterial disease. Continue aggressive cholesterol management. Goal LDL less than 70. We will start Plavix. She does not want aspirin as it upsets her stomach.

## 2010-09-18 NOTE — Progress Notes (Signed)
Addended by: Lanny Hurst E on: 09/18/2010 04:10 PM   Modules accepted: Orders

## 2010-09-18 NOTE — Assessment & Plan Note (Signed)
I have suggested we recheck her cholesterol in 2 months time. She was off her statin while in the hospital recently.

## 2010-09-18 NOTE — Patient Instructions (Signed)
You are doing well. Please start plavix 75 mg daily Please call us if you have new issues that need to be addressed before your next appt.  We will call you for a follow up Appt. In 6 months

## 2010-09-20 ENCOUNTER — Telehealth: Payer: Self-pay

## 2010-09-20 MED ORDER — CLOPIDOGREL BISULFATE 75 MG PO TABS: 75.0000 mg | ORAL_TABLET | Freq: Every day | ORAL | Status: DC

## 2010-09-20 NOTE — Telephone Encounter (Signed)
[facility]

## 2010-10-04 ENCOUNTER — Encounter: Payer: Self-pay | Admitting: Cardiovascular Disease

## 2010-10-04 ENCOUNTER — Inpatient Hospital Stay: Payer: Self-pay | Admitting: Surgery

## 2010-10-09 ENCOUNTER — Ambulatory Visit (HOSPITAL_BASED_OUTPATIENT_CLINIC_OR_DEPARTMENT_OTHER): Admission: RE | Admit: 2010-10-09 | Payer: Medicare Other | Source: Ambulatory Visit | Admitting: Orthopedic Surgery

## 2010-10-10 ENCOUNTER — Other Ambulatory Visit: Payer: Self-pay | Admitting: Family Medicine

## 2010-10-14 ENCOUNTER — Encounter: Payer: Self-pay | Admitting: Family Medicine

## 2010-10-14 ENCOUNTER — Ambulatory Visit (INDEPENDENT_AMBULATORY_CARE_PROVIDER_SITE_OTHER): Payer: Medicare Other | Admitting: Family Medicine

## 2010-10-14 DIAGNOSIS — K5900 Constipation, unspecified: Secondary | ICD-10-CM

## 2010-10-14 DIAGNOSIS — K56609 Unspecified intestinal obstruction, unspecified as to partial versus complete obstruction: Secondary | ICD-10-CM

## 2010-10-14 MED ORDER — LACTULOSE 10 GM/15ML PO SOLN: ORAL | Status: DC

## 2010-10-14 MED ORDER — PROMETHAZINE HCL 25 MG PO TABS: 25.0000 mg | ORAL_TABLET | Freq: Four times a day (QID) | ORAL | Status: DC | PRN

## 2010-10-14 MED ORDER — ONDANSETRON HCL 4 MG PO TABS: 4.0000 mg | ORAL_TABLET | Freq: Three times a day (TID) | ORAL | Status: DC | PRN

## 2010-10-14 NOTE — Patient Instructions (Signed)
Take 1 cap of miralax daily.  If you go 24 hours without a bowel movement, then continue the miralax but add on the lactulose once daily until BM.   See Shirlee Limerick about your referral before your leave today. I sent your meds to the pharmacy.   Take care.   Call me with an update later this week or early next week.

## 2010-10-14 NOTE — Assessment & Plan Note (Signed)
Refer to GI.  Likely multifactorial- DM, opiates, prev abd surgery.  D/w pt about bowel regimen- will use miralax daily and then additional lactulose if no BM for 24 hours.  She'll call back with update.  It doesn't appear to be possible to dec her pain meds.  She agrees with plan.  I would like GI input on further eval, ie possible colonoscopy and other ideas for motility.  >25 min spent with face to face with patient, >50% counseling.

## 2010-10-14 NOTE — Progress Notes (Signed)
[facility] admission.  Available records reviewed. Awaiting discharge summary.  Admitted with likely partial SBO after episode of abd pain and vomiting.  Had similar prev.  Responded to NTG and didn't require ex-lap.  Now tolerating PO with BM and flatus.  She had diarrhea this AM with likely overcorrection from meds.  She is concerned about repeat episodes of this and we talked about options.  She has known constipation, diabetes, chronic pain on opiates and neurogenic bladder.  We talked about options.   No fevers.  Not SOB at rest, but she is deconditioned.  Weight and sugars have been stable at home.  Nausea is a problem for her, she needs refills on zofran and promethazine for prn use.  No vomiting since coming home from [facility].  Abd pain is much improved.   She had prev seen Dr. [name] and was asking about GI eval.  I support this.   PMH and SH reviewed  ROS: See HPI, otherwise noncontributory.  Meds, vitals, and allergies reviewed.   Nad, intially worried but appeared more at ease as the visit proceeded. Mmm Not tachy, ectopy noted ctab abd soft, minimally ttp in RUQ but no rebound.  Not ttp o/w and normal BS Ext with 2+ edema

## 2010-10-18 ENCOUNTER — Telehealth: Payer: Self-pay | Admitting: *Deleted

## 2010-10-18 MED ORDER — LACTULOSE 10 GM/15ML PO SOLN: ORAL | Status: DC

## 2010-10-18 NOTE — Telephone Encounter (Signed)
I called pt.  Weight has been up and down some, but not sig varied.  She has had a lot of gas.  Still with BMs, but they are small.  We talked about options.  She inc the lactulose to bid and use miralax daily.  She'll call back with update.

## 2010-10-18 NOTE — Telephone Encounter (Signed)
Pt states she was told to call you today and you would call her back.  She will wait for your call.

## 2010-10-21 ENCOUNTER — Telehealth: Payer: Self-pay | Admitting: *Deleted

## 2010-10-21 ENCOUNTER — Telehealth: Payer: Self-pay | Admitting: Internal Medicine

## 2010-10-21 ENCOUNTER — Encounter: Payer: Self-pay | Admitting: *Deleted

## 2010-10-21 NOTE — Telephone Encounter (Signed)
Spoke with patient and she states she has been to [facility] in May and again 3 weeks ago with SBO. She is not having any problems now. She saw Dr. [name] in the past and wants to be seen here again. She does not want to go back to [facility]. States she was seen by surgeon there and her PCP is Dr. Para March. He suggested she get an appointment with Dr. [name]. Scheduled patient on 11/11/10 at 2:30 PM. New patient letter mailed.

## 2010-10-21 NOTE — Telephone Encounter (Signed)
Advised patient

## 2010-10-21 NOTE — Telephone Encounter (Signed)
Pt states she was told to call and let you know how she's doing.  She is not any better.  She has an appt with Dr. [name] on 10/24, but she doesn't want to wait that long. She is asking what you think she should do- ok to wait or should she see someone else.

## 2010-10-21 NOTE — Telephone Encounter (Signed)
See if she can get in sooner with another doc in the same clinic.  I would try that first.  Thanks.

## 2010-10-24 ENCOUNTER — Other Ambulatory Visit: Payer: Self-pay | Admitting: Family Medicine

## 2010-10-25 ENCOUNTER — Telehealth: Payer: Self-pay | Admitting: *Deleted

## 2010-10-25 MED ORDER — POLYETHYLENE GLYCOL 3350 17 GM/SCOOP PO POWD: 17.0000 g | Freq: Two times a day (BID) | ORAL | Status: DC

## 2010-10-25 MED ORDER — LACTULOSE 10 GM/15ML PO SOLN: ORAL | Status: DC

## 2010-10-25 NOTE — Telephone Encounter (Signed)
Please call her and get an update/list of questions.  Thanks.

## 2010-10-25 NOTE — Telephone Encounter (Signed)
I called pt.  Still not having 'normal' BM.  Still with daily BM, small and soft.  We talked about options.  She is going to talk miralax bid, except for substituting the lactulose for the AM dose on MWF.  She'll call back as needed.

## 2010-10-25 NOTE — Telephone Encounter (Signed)
Patient is requesting a phone call from Dr. Para March or his nurse regarding an update on her status this week.  She stated that Dr. Para March told her to call back with this information.

## 2010-10-25 NOTE — Telephone Encounter (Signed)
Pt is worried about the Lactulose.  It is sending her sugar levels up.  2 hour PP will be in the 200's.  Fluid in legs has been good, varying only a pound.  Having a little difficulty with gassiness and pressure.  She is being careful with her diet, keeping it light.  BM's are still small and soft, going once or twice a day.  Taking Lactulose once in the am and Miralax at supper.  Has an appt on 11/11/10 with Dr. [name].  That is the soonest she could get in.

## 2010-11-06 ENCOUNTER — Inpatient Hospital Stay (HOSPITAL_COMMUNITY)
Admission: EM | Admit: 2010-11-06 | Discharge: 2010-11-11 | DRG: 445 | Disposition: A | Discharge status: Home Health | Payer: Medicare Other | Source: Ambulatory Visit | Attending: Internal Medicine | Admitting: Internal Medicine

## 2010-11-06 ENCOUNTER — Emergency Department (HOSPITAL_COMMUNITY): Payer: Medicare Other

## 2010-11-06 DIAGNOSIS — M353 Polymyalgia rheumatica: Secondary | ICD-10-CM | POA: Diagnosis present

## 2010-11-06 DIAGNOSIS — E876 Hypokalemia: Secondary | ICD-10-CM | POA: Diagnosis not present

## 2010-11-06 DIAGNOSIS — IMO0001 Reserved for inherently not codable concepts without codable children: Secondary | ICD-10-CM | POA: Diagnosis present

## 2010-11-06 DIAGNOSIS — I4891 Unspecified atrial fibrillation: Secondary | ICD-10-CM | POA: Diagnosis present

## 2010-11-06 DIAGNOSIS — I10 Essential (primary) hypertension: Secondary | ICD-10-CM | POA: Diagnosis present

## 2010-11-06 DIAGNOSIS — I5032 Chronic diastolic (congestive) heart failure: Secondary | ICD-10-CM | POA: Diagnosis present

## 2010-11-06 DIAGNOSIS — J441 Chronic obstructive pulmonary disease with (acute) exacerbation: Secondary | ICD-10-CM | POA: Diagnosis present

## 2010-11-06 DIAGNOSIS — Z794 Long term (current) use of insulin: Secondary | ICD-10-CM

## 2010-11-06 DIAGNOSIS — I509 Heart failure, unspecified: Secondary | ICD-10-CM | POA: Diagnosis present

## 2010-11-06 DIAGNOSIS — Z981 Arthrodesis status: Secondary | ICD-10-CM

## 2010-11-06 DIAGNOSIS — J45901 Unspecified asthma with (acute) exacerbation: Secondary | ICD-10-CM | POA: Diagnosis present

## 2010-11-06 DIAGNOSIS — N319 Neuromuscular dysfunction of bladder, unspecified: Secondary | ICD-10-CM | POA: Diagnosis present

## 2010-11-06 DIAGNOSIS — E119 Type 2 diabetes mellitus without complications: Secondary | ICD-10-CM | POA: Diagnosis present

## 2010-11-06 DIAGNOSIS — K219 Gastro-esophageal reflux disease without esophagitis: Secondary | ICD-10-CM | POA: Diagnosis present

## 2010-11-06 DIAGNOSIS — M216X9 Other acquired deformities of unspecified foot: Secondary | ICD-10-CM | POA: Diagnosis present

## 2010-11-06 DIAGNOSIS — Z87891 Personal history of nicotine dependence: Secondary | ICD-10-CM

## 2010-11-06 DIAGNOSIS — K8050 Calculus of bile duct without cholangitis or cholecystitis without obstruction: Principal | ICD-10-CM | POA: Diagnosis present

## 2010-11-06 DIAGNOSIS — R5381 Other malaise: Secondary | ICD-10-CM | POA: Diagnosis present

## 2010-11-06 LAB — CBC
Hemoglobin: 12.7 g/dL (ref 12.0–15.0)
MCH: 30.1 pg (ref 26.0–34.0)
MCV: 89.1 fL (ref 78.0–100.0)
RBC: 4.22 MIL/uL (ref 3.87–5.11)

## 2010-11-06 LAB — GLUCOSE, CAPILLARY: Glucose-Capillary: 194 mg/dL — ABNORMAL HIGH (ref 70–99)

## 2010-11-06 LAB — POCT I-STAT, CHEM 8
Calcium, Ion: 1.11 mmol/L — ABNORMAL LOW (ref 1.12–1.32)
Creatinine, Ser: 1.1 mg/dL (ref 0.50–1.10)
Glucose, Bld: 202 mg/dL — ABNORMAL HIGH (ref 70–99)
Hemoglobin: 13.3 g/dL (ref 12.0–15.0)
TCO2: 30 mmol/L (ref 0–100)

## 2010-11-06 LAB — DIFFERENTIAL
Basophils Absolute: 0 10*3/uL (ref 0.0–0.1)
Basophils Relative: 0 % (ref 0–1)
Eosinophils Absolute: 0 10*3/uL (ref 0.0–0.7)
Eosinophils Relative: 0 % (ref 0–5)
Monocytes Absolute: 1 10*3/uL (ref 0.1–1.0)

## 2010-11-07 DIAGNOSIS — R932 Abnormal findings on diagnostic imaging of liver and biliary tract: Secondary | ICD-10-CM

## 2010-11-07 DIAGNOSIS — R740 Nonspecific elevation of levels of transaminase and lactic acid dehydrogenase [LDH]: Secondary | ICD-10-CM

## 2010-11-07 DIAGNOSIS — R17 Unspecified jaundice: Secondary | ICD-10-CM

## 2010-11-07 DIAGNOSIS — K8050 Calculus of bile duct without cholangitis or cholecystitis without obstruction: Secondary | ICD-10-CM

## 2010-11-07 DIAGNOSIS — R7402 Elevation of levels of lactic acid dehydrogenase (LDH): Secondary | ICD-10-CM

## 2010-11-07 DIAGNOSIS — K831 Obstruction of bile duct: Secondary | ICD-10-CM

## 2010-11-07 LAB — HEPATIC FUNCTION PANEL
AST: 165 U/L — ABNORMAL HIGH (ref 0–37)
Albumin: 3.1 g/dL — ABNORMAL LOW (ref 3.5–5.2)
Alkaline Phosphatase: 425 U/L — ABNORMAL HIGH (ref 39–117)
Total Bilirubin: 4.3 mg/dL — ABNORMAL HIGH (ref 0.3–1.2)
Total Protein: 7.1 g/dL (ref 6.0–8.3)

## 2010-11-07 LAB — DIFFERENTIAL
Basophils Absolute: 0 10*3/uL (ref 0.0–0.1)
Basophils Relative: 0 % (ref 0–1)
Lymphocytes Relative: 5 % — ABNORMAL LOW (ref 12–46)
Neutro Abs: 11.5 10*3/uL — ABNORMAL HIGH (ref 1.7–7.7)
Neutrophils Relative %: 87 % — ABNORMAL HIGH (ref 43–77)

## 2010-11-07 LAB — GLUCOSE, CAPILLARY: Glucose-Capillary: 141 mg/dL — ABNORMAL HIGH (ref 70–99)

## 2010-11-07 LAB — BASIC METABOLIC PANEL
BUN: 18 mg/dL (ref 6–23)
CO2: 33 mEq/L — ABNORMAL HIGH (ref 19–32)
Calcium: 9.4 mg/dL (ref 8.4–10.5)
GFR calc non Af Amer: 55 mL/min — ABNORMAL LOW (ref >60)
Glucose, Bld: 155 mg/dL — ABNORMAL HIGH (ref 70–99)

## 2010-11-07 LAB — COMPREHENSIVE METABOLIC PANEL
AST: 167 U/L — ABNORMAL HIGH (ref 0–37)
Alkaline Phosphatase: 443 U/L — ABNORMAL HIGH (ref 39–117)
BUN: 22 mg/dL (ref 6–23)
CO2: 31 mEq/L (ref 19–32)
Chloride: 99 mEq/L (ref 96–112)
Creatinine, Ser: 1 mg/dL (ref 0.50–1.10)
GFR calc non Af Amer: 54 mL/min — ABNORMAL LOW (ref >60)
Total Bilirubin: 3.7 mg/dL — ABNORMAL HIGH (ref 0.3–1.2)

## 2010-11-07 LAB — CBC
HCT: 38.5 % (ref 36.0–46.0)
Hemoglobin: 12.7 g/dL (ref 12.0–15.0)
RBC: 4.26 MIL/uL (ref 3.87–5.11)
WBC: 13.3 10*3/uL — ABNORMAL HIGH (ref 4.0–10.5)

## 2010-11-07 LAB — TSH: TSH: 0.553 u[IU]/mL (ref 0.350–4.500)

## 2010-11-07 LAB — URINALYSIS, ROUTINE W REFLEX MICROSCOPIC
Glucose, UA: NEGATIVE mg/dL
Ketones, ur: NEGATIVE mg/dL
Leukocytes, UA: NEGATIVE
Protein, ur: 30 mg/dL — AB

## 2010-11-07 LAB — LACTIC ACID, PLASMA: Lactic Acid, Venous: 1.4 mmol/L (ref 0.5–2.2)

## 2010-11-07 MED ORDER — IOHEXOL 300 MG/ML  SOLN
100.0000 mL | Freq: Once | INTRAMUSCULAR | Status: AC | PRN
  Administered 2010-11-07: 100 mL via INTRAVENOUS

## 2010-11-08 ENCOUNTER — Inpatient Hospital Stay (HOSPITAL_COMMUNITY): Payer: Medicare Other

## 2010-11-08 DIAGNOSIS — K8050 Calculus of bile duct without cholangitis or cholecystitis without obstruction: Secondary | ICD-10-CM

## 2010-11-08 LAB — COMPREHENSIVE METABOLIC PANEL
ALT: 126 U/L — ABNORMAL HIGH (ref 0–35)
AST: 130 U/L — ABNORMAL HIGH (ref 0–37)
Alkaline Phosphatase: 371 U/L — ABNORMAL HIGH (ref 39–117)
Calcium: 9.3 mg/dL (ref 8.4–10.5)
GFR calc Af Amer: 58 mL/min — ABNORMAL LOW (ref >60)
Glucose, Bld: 114 mg/dL — ABNORMAL HIGH (ref 70–99)
Potassium: 3.3 mEq/L — ABNORMAL LOW (ref 3.5–5.1)
Sodium: 143 mEq/L (ref 135–145)
Total Protein: 6.4 g/dL (ref 6.0–8.3)

## 2010-11-08 LAB — GLUCOSE, CAPILLARY
Glucose-Capillary: 117 mg/dL — ABNORMAL HIGH (ref 70–99)
Glucose-Capillary: 132 mg/dL — ABNORMAL HIGH (ref 70–99)
Glucose-Capillary: 149 mg/dL — ABNORMAL HIGH (ref 70–99)
Glucose-Capillary: 151 mg/dL — ABNORMAL HIGH (ref 70–99)

## 2010-11-08 LAB — CBC
Hemoglobin: 11.4 g/dL — ABNORMAL LOW (ref 12.0–15.0)
MCH: 30.1 pg (ref 26.0–34.0)
MCHC: 32.9 g/dL (ref 30.0–36.0)
Platelets: 225 10*3/uL (ref 150–400)
RBC: 3.79 MIL/uL — ABNORMAL LOW (ref 3.87–5.11)

## 2010-11-08 LAB — SURGICAL PCR SCREEN
MRSA, PCR: NEGATIVE
Staphylococcus aureus: NEGATIVE

## 2010-11-08 LAB — TYPE AND SCREEN

## 2010-11-08 NOTE — Consult Note (Signed)
[name], HABERMAN NO.:  [id number]  MEDICAL RECORD NO.:  [id number]  LOCATION:  [facility]                         FACILITY:  [facility]  PHYSICIAN:  Abigail Miyamoto, M.D. DATE OF BIRTH:  16-Feb-1932  DATE OF CONSULTATION: DATE OF DISCHARGE:                                CONSULTATION   REFERRING PHYSICIANS:  Eduard Clos, MD as well as Dr. Brantley Stage in the emergency department.  CHIEF COMPLAINT:  Abdominal pain, common bile duct stones, free air.  HISTORY:  This is a 77 year old female with a history of multiple chronic medical problems who has been having intermittent abdominal pain, nausea and vomiting since May 2012, and has gotten worse over the past several days.  She presented several times to [facility] by her report and because of her persistent discomfort, decided to come to the [facility] Emergency Department.  I have been consulted after CAT scan findings.  The patient reports currently that her pain has been more severe at times, currently as I am seeing her is only mild-to-moderate right upper quadrant.  She again had intermittent nausea and vomiting with this, but her bowel movements are normal for her.  PAST MEDICAL HISTORY:  She has a significant past medical history which includes: 1. Atrial fibrillation. 2. Hypertension. 3. Chronic narcotic use. 4. COPD. 5. Adult-onset diabetes. 6. Gastroesophageal reflux disease. 7. History of peptic ulcer disease. 8. Neurogenic bladder from multiple spine surgeries and spine injury,     etc.  PAST SURGICAL HISTORY:  Thyroidectomy, hysterectomy, cholecystectomy, ventral hernia repair with mesh, emergent surgery for dehiscence and evisceration, multiple spine surgeries, appendectomy, craniotomy for subdural hematoma which occurred secondary to Coumadin.  FAMILY HISTORY:  Noncontributory.  MEDICATIONS:  Please see universal medical reconciliation form.  ALLERGIES:  Please see the medical  reconciliation form as she has multiple allergies including allergies CODEINE, ELAVIL,  XANAX, METHADONE, CARDIZEM, etc.  SOCIAL HISTORY:  She used to smoke in the past but has quit.  She denies alcohol use.  REVIEW OF SYSTEMS:  GENERAL:  Negative for fever or chills.  PULMONARY: Positive for chronic shortness of breath but negative for current cough. CARDIAC:  Negative for chest pain.  Positive for arrhythmias.  She has no history of myocardial infarction.  ABDOMEN:  Listed as above.  There is no hematemesis.  There is no blood in her stools.  She is on chronic laxatives secondary to her narcotic use.  NEUROLOGIC:  Shows her to have neurogenic bladder.  She has to use a wheelchair to ambulate well. Neuropathy in her lower extremities.  The rest of review including skin, eyes, ear, nose and throat, psychiatric is normal.  PHYSICAL EXAMINATION:  GENERAL:  This is a morbidly obese female who is sitting up.  She appears only minimally to mildly uncomfortable. VITAL SIGNS:  Temperature 98.2, heart rate 81, respiratory rate 20, blood pressure is 150/64, satting 99% on 2 liters. EYES:  Anicteric.  Pupils reactive bilaterally.  She is not anicteric. She has positive scleral icterus. ENT:  External ears and nose are normal.  Hearing is normal.  Oropharynx clear. NECK:  Supple.  She has a well healed incision on her neck.  There is no adenopathy or palpable masses. LUNGS:  Clear to auscultation bilaterally with normal respiratory effort. CARDIOVASCULAR:  Irregular rate and rhythm.  There are no murmurs. There is 1+ peripheral edema at the ankles. ABDOMEN:  Morbidly obese.  There are multiple well healed incisions. There are no hernias.  She has mild-to-moderate tenderness of right upper quadrant.  The rest of the abdomen is soft and nontender.  There is very little epigastric tenderness.  There is no frank peritonitis or peritoneal signs. MUSCULOSKELETAL:  Decreased function and strength  of her lower extremities.  Upper extremities are normal. SKIN:  Obvious jaundice. PSYCHIATRIC:  Awake, alert and oriented.  Judgment and affect appear normal.  She has decreased sensation and motor function of her lower extremities.  LABORATORY DATA:  The patient has a bilirubin 3.7, alkaline phosphatase 443, AST 167, ALT 160, lactic acid 1.4, lipase 27.  White blood count is elevated at 14.6, hemoglobin is 12.7, platelets are 256.  The patient has a CAT scan of the abdomen and pelvis that shows a tiny amount of free air in the right upper quadrant hepatic flexure of uncertain etiology.  There is very minimal stranding.  There is no free fluid or abscess.  She also has 2 large stones in her distal common bile duct once at the ampulla, large measures 1.9 cm.  There is dilation of the intra and extrahepatic biliary ducts.  She has aortic stenosis and renal cysts as well.  IMPRESSION:  The patient with choledocholithiasis, jaundice and free air of uncertain etiology without frank peritonitis.  PLAN:  At this point, she is being admitted to the Medical Service.  IV antibiotics were been started.  Gastroenterology has been consulted to consider an ERCP.  She may need a percutaneous transabdominal drain placed in her liver by Interventional Radiology if an ERCP cannot be attempted or is unsuccessful.  At this point, I am  uncertain of the etiology of her free air but she again does not have an acute abdomen on physical examination.  If ERCP would be unsuccessful, she would no doubt need a potential common bile duct exploration.  We will follow her closely.     Abigail Miyamoto, M.D.     DB/MEDQ  D:  11/07/2010  T:  11/07/2010  Job:  [id number]  Electronically Signed by Abigail Miyamoto M.D. on 11/08/2010 08:25:26 AM

## 2010-11-09 DIAGNOSIS — K8050 Calculus of bile duct without cholangitis or cholecystitis without obstruction: Secondary | ICD-10-CM

## 2010-11-09 DIAGNOSIS — K831 Obstruction of bile duct: Secondary | ICD-10-CM

## 2010-11-09 DIAGNOSIS — R740 Nonspecific elevation of levels of transaminase and lactic acid dehydrogenase [LDH]: Secondary | ICD-10-CM

## 2010-11-09 LAB — GLUCOSE, CAPILLARY
Glucose-Capillary: 108 mg/dL — ABNORMAL HIGH (ref 70–99)
Glucose-Capillary: 164 mg/dL — ABNORMAL HIGH (ref 70–99)
Glucose-Capillary: 150 mg/dL — ABNORMAL HIGH (ref 70–99)
Glucose-Capillary: 139 mg/dL — ABNORMAL HIGH (ref 70–99)

## 2010-11-09 LAB — BASIC METABOLIC PANEL
CO2: 31 mEq/L (ref 19–32)
Chloride: 102 mEq/L (ref 96–112)
GFR calc Af Amer: 58 mL/min — ABNORMAL LOW (ref >60)
Potassium: 3.4 mEq/L — ABNORMAL LOW (ref 3.5–5.1)
Sodium: 141 mEq/L (ref 135–145)

## 2010-11-09 LAB — LIPASE, BLOOD: Lipase: 45 U/L (ref 11–59)

## 2010-11-09 LAB — HEPATIC FUNCTION PANEL
Albumin: 2.4 g/dL — ABNORMAL LOW (ref 3.5–5.2)
Alkaline Phosphatase: 316 U/L — ABNORMAL HIGH (ref 39–117)
Indirect Bilirubin: 0.7 mg/dL (ref 0.3–0.9)
Total Protein: 6.1 g/dL (ref 6.0–8.3)

## 2010-11-09 LAB — AMYLASE: Amylase: 56 U/L (ref 0–105)

## 2010-11-09 LAB — CBC
Hemoglobin: 10.7 g/dL — ABNORMAL LOW (ref 12.0–15.0)
MCH: 29.7 pg (ref 26.0–34.0)
MCHC: 32.3 g/dL (ref 30.0–36.0)
Platelets: 209 10*3/uL (ref 150–400)

## 2010-11-10 ENCOUNTER — Inpatient Hospital Stay (HOSPITAL_COMMUNITY): Payer: Medicare Other

## 2010-11-10 DIAGNOSIS — R740 Nonspecific elevation of levels of transaminase and lactic acid dehydrogenase [LDH]: Secondary | ICD-10-CM

## 2010-11-10 DIAGNOSIS — K8050 Calculus of bile duct without cholangitis or cholecystitis without obstruction: Secondary | ICD-10-CM

## 2010-11-10 DIAGNOSIS — K831 Obstruction of bile duct: Secondary | ICD-10-CM

## 2010-11-10 DIAGNOSIS — R932 Abnormal findings on diagnostic imaging of liver and biliary tract: Secondary | ICD-10-CM

## 2010-11-10 LAB — HEPATIC FUNCTION PANEL
ALT: 59 U/L — ABNORMAL HIGH (ref 0–35)
Albumin: 2.5 g/dL — ABNORMAL LOW (ref 3.5–5.2)
Alkaline Phosphatase: 267 U/L — ABNORMAL HIGH (ref 39–117)
Indirect Bilirubin: 0.6 mg/dL (ref 0.3–0.9)
Total Protein: 5.9 g/dL — ABNORMAL LOW (ref 6.0–8.3)

## 2010-11-10 LAB — GLUCOSE, CAPILLARY
Glucose-Capillary: 149 mg/dL — ABNORMAL HIGH (ref 70–99)
Glucose-Capillary: 253 mg/dL — ABNORMAL HIGH (ref 70–99)
Glucose-Capillary: 160 mg/dL — ABNORMAL HIGH (ref 70–99)

## 2010-11-10 LAB — BASIC METABOLIC PANEL
CO2: 31 mEq/L (ref 19–32)
Chloride: 101 mEq/L (ref 96–112)
Creatinine, Ser: 1.15 mg/dL — ABNORMAL HIGH (ref 0.50–1.10)
Glucose, Bld: 147 mg/dL — ABNORMAL HIGH (ref 70–99)
Sodium: 139 mEq/L (ref 135–145)

## 2010-11-10 LAB — CBC
Hemoglobin: 10.4 g/dL — ABNORMAL LOW (ref 12.0–15.0)
MCH: 29.5 pg (ref 26.0–34.0)
Platelets: 221 10*3/uL (ref 150–400)
RBC: 3.52 MIL/uL — ABNORMAL LOW (ref 3.87–5.11)

## 2010-11-11 ENCOUNTER — Encounter: Payer: Self-pay | Admitting: Internal Medicine

## 2010-11-11 ENCOUNTER — Ambulatory Visit: Payer: Medicare Other | Admitting: Internal Medicine

## 2010-11-11 DIAGNOSIS — K8050 Calculus of bile duct without cholangitis or cholecystitis without obstruction: Secondary | ICD-10-CM

## 2010-11-11 LAB — HEPATIC FUNCTION PANEL
Alkaline Phosphatase: 272 U/L — ABNORMAL HIGH (ref 39–117)
Bilirubin, Direct: 0.7 mg/dL — ABNORMAL HIGH (ref 0.0–0.3)
Indirect Bilirubin: 0.7 mg/dL (ref 0.3–0.9)
Total Bilirubin: 1.4 mg/dL — ABNORMAL HIGH (ref 0.3–1.2)

## 2010-11-11 LAB — BASIC METABOLIC PANEL
Calcium: 9.5 mg/dL (ref 8.4–10.5)
GFR calc Af Amer: 57 mL/min — ABNORMAL LOW (ref >60)
GFR calc non Af Amer: 47 mL/min — ABNORMAL LOW (ref >60)
Sodium: 141 mEq/L (ref 135–145)

## 2010-11-11 LAB — GLUCOSE, CAPILLARY: Glucose-Capillary: 168 mg/dL — ABNORMAL HIGH (ref 70–99)

## 2010-11-11 LAB — CBC
MCH: 29.9 pg (ref 26.0–34.0)
MCV: 92 fL (ref 78.0–100.0)
Platelets: 254 10*3/uL (ref 150–400)
RDW: 15.4 % (ref 11.5–15.5)

## 2010-11-12 HISTORY — PX: ERCP: SHX60

## 2010-11-13 ENCOUNTER — Telehealth: Payer: Self-pay | Admitting: *Deleted

## 2010-11-13 DIAGNOSIS — N319 Neuromuscular dysfunction of bladder, unspecified: Secondary | ICD-10-CM

## 2010-11-13 NOTE — Telephone Encounter (Signed)
Pt is requesting that you call her next week at your convenience.  She has recently been in hospital.  She wanted you to know that she is no longer taking lactulose, is taking miralax twice a day.

## 2010-11-19 NOTE — Telephone Encounter (Signed)
Appt made with Dr Laverle Patter on 12/20/2010 at 8:45ampatient notified. mK

## 2010-11-19 NOTE — Discharge Summary (Signed)
NAMESHARLET, NOTARO                ACCOUNT NO.:  [id number]  MEDICAL RECORD NO.:  [id number]  LOCATION:  4709                         FACILITY:  [facility]  PHYSICIAN:  Erick Blinks, MD     DATE OF BIRTH:  [birth year]  DATE OF ADMISSION:  11/06/2010 DATE OF DISCHARGE:  11/11/2010                              DISCHARGE SUMMARY   PRIMARY CARE PHYSICIAN:  Dr. Para March.  GASTROENTEROLOGIST:  Iva Boop, MD,FACG  DISCHARGE DIAGNOSES: 1. Choledocholithiasis status post endoscopic retrograde     cholangiopancreatography with stent placement, and removal of     stones. 2. Elevated liver enzymes secondary to #1. 3. Intra-abdominal free air, resolved on subsequent imaging. 4. Atrial fibrillation, not a candidate for Coumadin secondary to     subdural hematoma. 5. Chronic obstructive pulmonary disease. 6. Chronic diastolic congestive heart failure, compensated. 7. Hypertension. 8. Type 2 diabetes. 9. Morbid obesity. 10.Deconditioning.DISCHARGE MEDICATIONS: 1. Percocet 5/325 mg 2 tablets p.o. q.i.d. p.r.n. 2. Valium 5 mg 1 tablet p.o. q.i.d. 3. Prevacid 30 mg 1 tablet p.o. daily. 4. Lasix 80 mg 2 tablets p.o. b.i.d. 5. Lanoxin 0.125 mg p.o. daily. 6. Toprol-XL 100 mg 1 tablet p.o. daily. 7. Morphine sulfate 15 mg 2 tablets p.o. b.i.d. p.r.n. 8. Crestor 40 mg p.o. daily. 9. MiraLax 17 g p.o. daily p.r.n. 10.Lantus 20 units subcutaneous at bedtime. 11.Humalog 115 units subcutaneous t.i.d. 12.Atrovent 1-2 puffs inhaled b.i.d. 13.Symbicort 160/4.5 mcg 2 sprays inhaled b.i.d. 14.Calcitriol 0.5 mcg 1 tablet p.o. daily. 15.Zofran 4 mg 1 tablet p.o. q.8 h. p.r.n. 16.Promethazine 25 mg 1 tablet p.o. q.6 h. p.r.n. 17.Benadryl 50 mg p.o. q.6 h. p.r.n. 18.Glycerin suppository one suppository rectally daily p.r.n. 19.Hydrocortisone acetate rectally one suppository per rectal daily     p.r.n.  ADMISSION HISTORY:  This is a 77 year old female who was brought to the emergency room with  complaints of abdominal pain.  The patient reported the pain is mostly epigastric and right upper quadrant in nature.  Pain was persistent, dull, and aching.  She was evaluated in the emergency room with a CT of the abdomen and pelvis, which did show 2 common bile stones and nonspecific scattered tiny foci intra-abdominal air.  She was seen in consultation by Dr. Magnus Ivan as well as Dr. Christella Hartigan from Gastroenterology and was admitted for further evaluation and treatment. For details, please refer to the history and physical per Dr. Toniann Fail on November 07, 2010.  HOSPITAL COURSE: 1. Choledocholithiasis.  The patient was followed by both of [facility] and [facility].  She     underwent ERCP and stent placement in her common bile duct, 4     common bile duct stones were removed.  Of note, the patient is     status post cholecystectomy.  Once the stent was placed, the     patient had significant relief of her symptoms and her liver     enzymes have since improved.  She was empirically covered with  Zosyn for antibiotic coverage.  She has been cleared for discharge     per the GI Service and will follow up with them on an outpatient  basis.  She is tolerating an oral diet without any significant     discomfort. 2. Chronic diastolic congestive heart failure.  The patient is     continued on her home dose of Lasix and is tolerating this. 3. Type 2 diabetes.  The patient was taking 65 units of Lantus at     home.  She was continued only on 10 units of Lantus here since     initially she was kept n.p.o.  Her blood sugars have been fairly     controlled ranging between 150-200.  At this time, we will increase     her dose to 20 units and ask her to further adjust her insulin as     her blood sugars will tolerate.  She will see her primary care     physician for further titration of this. 4. The remainder of the patient's medical issues have remained  stable. 5. Deconditioning.  The patient was seen by Physical Therapy and     Occupational Therapy and recommended CIR evaluation.  The patient     is adamant about returning home and reports that she has adequate     equipment at home.  We have set her up with home health RN, PT/OT,     as well as a home health aide.  CONSULTATIONS: 1. [facility], Dr. Magnus Ivan. 2. [facility], Dr. [name].  DIAGNOSTIC IMAGING:  CT abdomen and pelvis on admission shows scattered tiny foci and free entry of abdominal air noted at the right upper quadrant most prominent adjacent to the hepatic flexure of the colon anterior to the liver not associated with soft tissue stranding noted above hepatic flexure of colon raising suspicion for focal bowel perforation in this region of uncertain etiology, will assess due to complete bowel decompression.  No secondary evidence to suggest ischemic bowel despite significant vascular calcification, additional tiny foci of free air adjacent small bowel loops in the midabdomen, likely reflects positioning of the patient, 2 stones identified within the distal common hepatic duct of the pancreatic head and about the duodenal ampulla, the larger of which measures 1.9 cm in size reflects obstruction by retained stone status post cholecystectomy with resultant dilatation of the common hepatic duct to 1.7 cm and mild dilatation of intrahepatic biliary ducts, no evidence of gallstone erosion into the duodenum or gallstone ileus.  Abdominal x-ray of November 10, 2010, shows interval placement of biliary stent with gas in the biliary tree, nonobstructive bowel gas pattern.  DISCHARGE INSTRUCTIONS:  The patient should continue on a heart-healthy diet, conduct her activities as tolerated.  She has been set up with home health services, willing to see her primary care physician in 1 to 2 weeks, and has already been set up with the Gastroenterology  Service for further followup.  Plan was discussed with the patient which was also in agreement.  CONDITION AT TIME OF DISCHARGE:  Improved.     Erick Blinks, MD     JM/MEDQ  D:  11/11/2010  T:  11/11/2010  Job:  [id number]  cc:   Iva Boop, MD,FACG Dr. Para March  Electronically Signed by Durward Mallard Darral Rishel  on 11/19/2010 08:17:32 PM

## 2010-11-19 NOTE — Telephone Encounter (Signed)
I called pt.  She is s/p ERCP.  Her sugar is controlled on the miralax bid, off lactulose.  She had concerns about f/u with urology at [facility] and then with Dr. [facility] clinic.  She needs help to est with urology.  I would prefer her to have her f/u with uro.  She isn't able to self cath anymore.  I'll ask for help to get pt to see Dr. Pamala Hurry with [facility] or another urology MD in [place], like Dr. Achilles Dunk.  Shirlee Limerick, please talk to me about this.

## 2010-11-24 DIAGNOSIS — R269 Unspecified abnormalities of gait and mobility: Secondary | ICD-10-CM

## 2010-11-24 DIAGNOSIS — K8050 Calculus of bile duct without cholangitis or cholecystitis without obstruction: Secondary | ICD-10-CM

## 2010-11-24 DIAGNOSIS — E119 Type 2 diabetes mellitus without complications: Secondary | ICD-10-CM

## 2010-11-24 DIAGNOSIS — N319 Neuromuscular dysfunction of bladder, unspecified: Secondary | ICD-10-CM

## 2010-11-24 NOTE — H&P (Signed)
[name], [name]                ACCOUNT NO.:  [id number]  MEDICAL RECORD NO.:  [id number]  LOCATION:  [facility]                         FACILITY:  [facility]  PHYSICIAN:  Eduard Clos, MDDATE OF BIRTH:  Jul 23, 1931  DATE OF ADMISSION:  11/06/2010 DATE OF DISCHARGE:                             HISTORY & PHYSICAL   PRIMARY CARE PHYSICIAN:  Dr. Para March  CHIEF COMPLAINT:  Abdominal pain.  HISTORY OF PRESENTING ILLNESS:  A 77 year old female with known history of atrial fibrillation, not a candidate for Coumadin secondary to subdural hematoma; history of hypertension; history of spinal surgery status post neurogenic bladder and right foot drop; rheumatic fever; polymyalgia rheumatica; diabetes mellitus type 2; COPD with previous history of cigarette smoking has been experiencing some abdominal pain over last 2 days, which is mostly in the epigastric and right upper quadrant, which has been persistent, dull aching with nausea, but denies any vomiting.  Denies any diarrhea.  Denies any fever, chills.  The patient came to the ER.  In the ER, the patient had a CT abdomen and pelvis, which shows at this time 2 CBD stones and nonspecific scattered tiny foci of free intra-abdominal air in the right upper quadrant, prominent adjacent to hepatic flexure.  At this time, Dr. Magnus Ivan from Surgery has already evaluated the patient.  Dr. Magnus Ivan feels this intra-abdominal air is nonspecific and he wants further evaluation by GI for the CBD stone.  Dr. Christella Hartigan from Gastroenterology has already been contacted by ER physician, Dr. Norlene Campbell, who is going to see the patient later.  The patient denies any chest pain, shortness of breath.  Denies any fever, chills.  Denies any dysuria, discharge, diarrhea.  Denies any headache, visual symptoms.  Denies any new focal deficit.  The patient has a known history of right foot drop from previous spinal surgery. Denies any loss of consciousness.  PAST MEDICAL  HISTORY: 1. Atrial fibrillation, not a candidate for Coumadin secondary to     subdural hematoma. 2. History of diabetes mellitus type 2. 3. History of neurogenic bladder and right foot drop secondary to     spinal surgery. 4. History of COPD. 5. History of polymyalgia rheumatica. 6. History of renal failure. 7. History of gastric ulcer. 8. History of hypertension.  PAST SURGICAL HISTORY:  Subdural hematoma on the right side; history of abdominal hernia surgery; evisceration of the hernia surgery; colon polyps; benign cyst of her bladder; spinal fusion at L4, L5, and S1; carpal tunnel surgery, left hand; bladder tack; enchondroma of the left humerus; laminectomy; soft goiter; gallbladder; total hysterectomy; ovarian cyst; appendectomy and cyst in the left kidney.  MEDICATIONS ON ADMISSION:  The patient is on: 1. Percocet, dose has to be verified. 2. Valium 5 mg 1 tablet 4 times daily. 3. __________ mg p.o. daily. 4. Lasix 80 mg b.i.d. 5. Synthroid 112 mcg daily. 6. Lanoxin 125 mcg daily. 7. Toprol-XL 100 mg daily. 8. Morphine 15 mg 2 times daily p.r.n. 9. Crestor 40 mg daily. 10.__________ subcutaneous at bedtime. 11.Humalog. 12.Atrovent inhaler. 13.Symbicort. 14.Calcitrol. 15.Lactulose.  ALLERGIES: 1. CODEINE. 2. VICODIN. 3. TALWIN. 4. SINEQUAN. 5. DESYREL. 6. ELAVIL. 7. XANAX. 8. TRANXENE. 9. METHADONE. 10.LUDIOMIL. 11.KLONOPIN. 12.CARDIZEM  CD. 13.PROZAC. 14.AMBIEN. 15.PHENOBARBITAL. 16.NEURONTIN. 17.BACLOFEN. 18.ASPIRIN. 19.VIOXX. 20.CELEBREX. 21.COUMADIN. 22.SPIRIVA. 23.PRINIVIL. 24.LEVAQUIN. 25.NITROFURANTOIN.  FAMILY HISTORY:  Nothing contributory.  SOCIAL HISTORY:  The patient quit smoking 8 years ago.  Denies any alcohol, drug abuse.  REVIEW OF SYSTEMS:  As per pertinent history of presenting illness. Nothing else significant.  PHYSICAL EXAMINATION:  GENERAL:  The patient examined at bedside, not in acute distress. VITAL SIGNS:  Blood  pressure 150/60, pulse 80 per minute, temperature 98.2, respiration 80 per minute, O2 sat 99%. HEENT:  There is no definite icterus at this time.  No pallor.  No facial asymmetry.  Tongue is midline. CHEST:  Bilateral air entry present.  No rhonchi.  No crepitation. HEART:  S1 and S2 heard. ABDOMEN:  Soft.  Tenderness in the epigastric and right upper quadrant area.  No guarding.  No rigidity. CNS:  Alert, awake, and oriented to time, place, and person.  Moves upper and lower extremities.  There is a right foot drop, which is old. EXTREMITIES:  Peripheral pulses felt.  Mild edema.  LABORATORY DATA:  EKG shows atrial fibrillation rate around 87 beats per minute with bigeminy with nonspecific ST-T changes.  CT abdomen and pelvis with contrast shows scattered tiny foci of free intra-abdominal air noted at the right upper quadrant, most prominent adjacent to the hepatic flexure of the colon entry to the liver, mild associated soft tissue stranding noted above the hepatic flexure of the colon raising suspicion for focal bowel perforation in this region of uncertain etiology.  The colon is not well assessed due to complete decompression __________ ischemic bowel, despite significant vascular calcification. Additional scattered tiny foci of free air adjacent to small bowel loops at the mid abdomen, likely reflects __________ of the patient.  Two obstructing stones identified within the distal common hepatic duct at the pancreatic head above __________ of which measures 1.9 cm in size with respect to obstruction by retained stones, status post cholecystectomy with resultant dilatation of the common hepatic duct to 1.7 cm and non dilatation of the intrahepatic biliary duct.  No evidence of gallstones erosion into the duodenum or gallstone ileus.  Moderate-to- severe focal stenosis noted at the mid abdominal aorta just distal to the renal arteries and at the distal abdominal aorta both level of  the origin of the IMA due to dense calcification.  Multiple hypodense and hyperdense cysts arising from both kidneys, diffuse coronary artery calcification noted.  Bibasilar atelectasis seen.  CBC:  WBC 14.6, hemoglobin 13.3, hematocrit 39, platelets 253, neutrophils 90%. Complete metabolic panel:  Sodium 140, potassium 3.7, chloride 100, carbon dioxide 31, glucose 202, BUN 24, creatinine 1.1.  Total bilirubin is 3.4, alk phos 443, AST 167, ALT 160, total protein 7.2, albumin 3.1, calcium 9.6, lactic acid 1.4, lipase 27.  UA is negative for glucose, negative for ketones, negative for nitrite, leukocytes __________.  ASSESSMENT: 1. Choledocholithiasis. 2. Jaundice with hepatitis, probably from #1 reason. 3. Intra-abdominal free air of uncertain etiology. 4. Atrial fibrillation, presently rate controlled, not a candidate for     Coumadin secondary to subdural hematoma. 5. Chronic obstructive pulmonary disease with previous history of     cigarette smoking, presently stable. 6. History of diastolic heart failure.  Last 2-D echo was in March     2011, EF of 55%-65% . 7. History of hypertension. 8. History of diabetes mellitus type 2.  PLAN: 1. At this time, we will admit the patient to telemetry. 2. For the patient's free intra-abdominal air of uncertain  etiology,     Dr. Magnus Ivan has already evaluated the patient from Surgery.  At     this time, the patient will be kept n.p.o.  Dr. Magnus Ivan at this     time wants the patient to be evaluated by GI for her CBD stone.  At     this time, the patient will be kept on empiric antibiotics, Zosyn. 3. Choledocholithiasis with jaundice and hepatitis.  Dr. Christella Hartigan of     Gastroenterology has been already contacted by Dr. Norlene Campbell.  Dr.     __________ will be seeing the patient.  At this time, we will keep     the patient n.p.o. in anticipation of possible ERCP and further     recommendation based on GI and surgery with regarding to her intra-      abdominal issues. 4. Diabetes mellitus type 2.  At this time, the patient will be kept     n.p.o.  The patient does take Lantus insulin.  As the patient is     kept on n.p.o., I am going to keep the patient on Lantus 10 units     now.  She usually takes 65 units.  I will keep the patient on CBG     checks with sliding scale coverage. 5. History of hypertension and atrial fibrillation.  The patient     usually takes Toprol-XL.  I will keep the patient on     metoprolol 5 mg IV q.6 h. as a scheduled dose until the patient can     take her p.o. Toprol-XL. 6. We will continue with the Lasix for now and Synthroid as IV and     Ativan instead of her Valium. 7. Further recommendation based on test ordered, clinical course, and     consults or recommendations.     Eduard Clos, MD     ANK/MEDQ  D:  11/07/2010  T:  11/07/2010  Job:  [id number]  cc:   Dr. Para March  Electronically Signed by Midge Minium MD on 11/24/2010 11:58:36 AM

## 2010-11-25 LAB — GLUCOSE, CAPILLARY: Glucose-Capillary: 88

## 2010-12-08 ENCOUNTER — Encounter: Payer: Self-pay | Admitting: Family Medicine

## 2010-12-08 DIAGNOSIS — K8050 Calculus of bile duct without cholangitis or cholecystitis without obstruction: Secondary | ICD-10-CM | POA: Insufficient documentation

## 2010-12-18 ENCOUNTER — Ambulatory Visit: Payer: Medicare Other | Admitting: Internal Medicine

## 2010-12-18 ENCOUNTER — Encounter: Payer: Self-pay | Admitting: Internal Medicine

## 2010-12-18 ENCOUNTER — Ambulatory Visit (INDEPENDENT_AMBULATORY_CARE_PROVIDER_SITE_OTHER): Payer: Medicare Other | Admitting: Internal Medicine

## 2010-12-18 VITALS — BP 138/72 | HR 64 | Ht 64.0 in | Wt 224.0 lb

## 2010-12-18 DIAGNOSIS — K8050 Calculus of bile duct without cholangitis or cholecystitis without obstruction: Secondary | ICD-10-CM

## 2010-12-18 DIAGNOSIS — E119 Type 2 diabetes mellitus without complications: Secondary | ICD-10-CM

## 2010-12-18 DIAGNOSIS — J449 Chronic obstructive pulmonary disease, unspecified: Secondary | ICD-10-CM

## 2010-12-18 NOTE — Assessment & Plan Note (Signed)
This and other comorbidities indicate need for general anesthesia

## 2010-12-18 NOTE — Patient Instructions (Addendum)
You have been scheduled for an Observation Admission at [facility] on 12/24/10. Please report to [facility] Admissions (you will be contacted with a time and more information). You have been scheduled to have your ERCP with stent removal the next day 12/25/10 at 10:30 am.

## 2010-12-18 NOTE — Assessment & Plan Note (Signed)
For stent removal and duct clearance 12/25/10

## 2010-12-18 NOTE — Progress Notes (Signed)
  Subjective:    Patient ID: [name], female    DOB: 1932/01/17, 77 y.o.   MRN: [id number]  HPI 77 yo woman s/pERCP and biliary sphincterotomy for choledocholithiasis in September. Has done well since except for mild RUQ pain. Does have chronic pain syndrome and chronic dyspnea and poor exercise tolerance, using a scooter for mobility.   Review of Systems Very sluggish in the AM, takes her hours to get moving and reduce pain.    Objective:   Physical Exam Obese and chronically ill Eyes anicteric Breath sounds decreased diffusely Distant S1 and s2 Obese and nontender abdomen       Assessment & Plan:  Choledocholithiasis s/p ERCP and biliary stent - needs removal of stent and documentation of removal of stones Severe comorbidities that require anesthesia for ERCP COPD, obesity, , DM and renal failure, etc Other comorbidities make it difficult for her to prepare for ERCP so will place in observation 10/30 and perform ERCP 10/31. Advise anesthesia she is in when admitted to observation.

## 2010-12-18 NOTE — Assessment & Plan Note (Signed)
Will adjust medications prior to ERCP

## 2010-12-23 ENCOUNTER — Telehealth: Payer: Self-pay | Admitting: Internal Medicine

## 2010-12-23 NOTE — Telephone Encounter (Signed)
Patient advised that she will be contacted once we know abut her room tomorrow.

## 2010-12-24 ENCOUNTER — Observation Stay (HOSPITAL_COMMUNITY)
Admission: AD | Admit: 2010-12-24 | Discharge: 2010-12-26 | Disposition: A | Discharge status: Home / Self Care | Payer: Medicare Other | Source: Ambulatory Visit | Attending: Internal Medicine | Admitting: Internal Medicine

## 2010-12-24 DIAGNOSIS — K8050 Calculus of bile duct without cholangitis or cholecystitis without obstruction: Principal | ICD-10-CM | POA: Diagnosis present

## 2010-12-24 DIAGNOSIS — K7689 Other specified diseases of liver: Secondary | ICD-10-CM | POA: Insufficient documentation

## 2010-12-24 DIAGNOSIS — Z79899 Other long term (current) drug therapy: Secondary | ICD-10-CM | POA: Insufficient documentation

## 2010-12-24 DIAGNOSIS — E669 Obesity, unspecified: Secondary | ICD-10-CM | POA: Insufficient documentation

## 2010-12-24 DIAGNOSIS — G43909 Migraine, unspecified, not intractable, without status migrainosus: Secondary | ICD-10-CM | POA: Insufficient documentation

## 2010-12-24 DIAGNOSIS — Z794 Long term (current) use of insulin: Secondary | ICD-10-CM | POA: Insufficient documentation

## 2010-12-24 DIAGNOSIS — N189 Chronic kidney disease, unspecified: Secondary | ICD-10-CM | POA: Insufficient documentation

## 2010-12-24 DIAGNOSIS — I6529 Occlusion and stenosis of unspecified carotid artery: Secondary | ICD-10-CM | POA: Insufficient documentation

## 2010-12-24 DIAGNOSIS — G894 Chronic pain syndrome: Secondary | ICD-10-CM | POA: Insufficient documentation

## 2010-12-24 DIAGNOSIS — K219 Gastro-esophageal reflux disease without esophagitis: Secondary | ICD-10-CM | POA: Insufficient documentation

## 2010-12-24 DIAGNOSIS — K8051 Calculus of bile duct without cholangitis or cholecystitis with obstruction: Secondary | ICD-10-CM

## 2010-12-24 DIAGNOSIS — M353 Polymyalgia rheumatica: Secondary | ICD-10-CM | POA: Insufficient documentation

## 2010-12-24 DIAGNOSIS — N319 Neuromuscular dysfunction of bladder, unspecified: Secondary | ICD-10-CM | POA: Insufficient documentation

## 2010-12-24 LAB — COMPREHENSIVE METABOLIC PANEL
ALT: 9 U/L (ref 0–35)
AST: 13 U/L (ref 0–37)
Alkaline Phosphatase: 101 U/L (ref 39–117)
CO2: 33 mEq/L — ABNORMAL HIGH (ref 19–32)
Calcium: 9.7 mg/dL (ref 8.4–10.5)
Potassium: 4.2 mEq/L (ref 3.5–5.1)
Sodium: 139 mEq/L (ref 135–145)
Total Protein: 6.7 g/dL (ref 6.0–8.3)

## 2010-12-24 LAB — CBC
HCT: 35.9 % — ABNORMAL LOW (ref 36.0–46.0)
Platelets: 225 10*3/uL (ref 150–400)
RBC: 3.98 MIL/uL (ref 3.87–5.11)
RDW: 14.9 % (ref 11.5–15.5)
WBC: 10.5 10*3/uL (ref 4.0–10.5)

## 2010-12-24 LAB — PROTIME-INR: INR: 1.09 (ref 0.00–1.49)

## 2010-12-25 ENCOUNTER — Other Ambulatory Visit: Payer: Medicare Other | Admitting: Internal Medicine

## 2010-12-25 ENCOUNTER — Observation Stay (HOSPITAL_COMMUNITY): Payer: Medicare Other

## 2010-12-25 DIAGNOSIS — K8051 Calculus of bile duct without cholangitis or cholecystitis with obstruction: Secondary | ICD-10-CM

## 2010-12-25 LAB — GLUCOSE, CAPILLARY
Glucose-Capillary: 187 mg/dL — ABNORMAL HIGH (ref 70–99)
Glucose-Capillary: 142 mg/dL — ABNORMAL HIGH (ref 70–99)
Glucose-Capillary: 188 mg/dL — ABNORMAL HIGH (ref 70–99)
Glucose-Capillary: 149 mg/dL — ABNORMAL HIGH (ref 70–99)

## 2010-12-26 LAB — GLUCOSE, CAPILLARY: Glucose-Capillary: 147 mg/dL — ABNORMAL HIGH (ref 70–99)

## 2010-12-28 NOTE — H&P (Signed)
NAMECORRIN, SIELING NO.:  [id number]  MEDICAL RECORD NO.:  [id number]  LOCATION:  1501                         FACILITY:  [facility]  PHYSICIAN:  Iva Boop, MD,FACGDATE OF BIRTH:  05-Jun-1931  DATE OF ADMISSION:  12/24/2010 DATE OF DISCHARGE:                             HISTORY & PHYSICAL   PRIMARY GASTROENTEROLOGIST:  Iva Boop, MD, [facility]  HISTORY OF PRESENT ILLNESS:  Ms. Garvey is a 77 year old female who was hospitalized in September 2012 with nausea, vomiting, and abdominal pain.  CT scan showed an obstructing distal common hepatic duct stones. Some scattered tiny foci of free air was seen in the right upper quadrant, near the hepatic flexure as well.  The patient received IV antibiotics, surgical consult was called.  The patient underwent an ERCP by Dr. Lina Sar and was found have massively dilated common bile duct and common hepatic duct with multiple stones.  At least 4 large stones were extracted but there were at least 2 additional stones remaining in the hepatic ducts.  A biliary stent was placed, sphincterotomy performed.  Following the procedure, Ms. Swanner felt better.  She was discharged home a few days later with plans for a repeat ERCP with stent removal.  Dr. [name] referred the patient to Dr. Leone Payor for additional biliary work.  The patient saw Dr. Leone Payor in the office on December 18, 2010.  The patient has had intermittent right upper quadrant discomfort since the initial ERCP but that is in the context of chronic pain syndrome.  The patient was scheduled for an ERCP to be done with propofol on December 25, 2010 with Dr. Leone Payor.  She is being admitted in preparation for the procedure.  PAST MEDICAL HISTORY: 1. Chronic kidney disease. 2. Obesity. 3. Chronic pain syndrome. 4. Neurogenic bladder. 5. History of spinal cord injury. 6. History endometriosis. 7. Polymyalgia rheumatica. 8. History of peptic ulcer disease. 9.  GERD. 10.Migraine headaches. 11.History of rheumatic fever. 12.History of H. pylori infection. 13.History of renal mass. 14.Fatty liver disease. 15.History of small-bowel obstruction. 16.Carotid artery disease.  FAMILY MEDICAL HISTORY:  Atrial fibrillation in mother, colon cancer in mother, heart disease in father, diabetes in sister, hypertension in sister, obesity and hypertension in brother and CVA.  SOCIAL HISTORY:  Former smoker.  No alcohol use.  MEDICATIONS: 1. Zofran 4 mg 1 tablet every 8 hours as needed. 2. Symbicort 160/4.5 mcg 2 puffs b.i.d. 3. Atrovent 2 puffs b.i.d. 4. Valium 5 mg 1 tablet 4 times a day. 5. Toprol-XL 100 mg 1 tablet daily. 6. Lanoxin 0.125 mg daily. 7. Glycerin rectal suppositories, 1 suppository daily as needed. 8. MiraLax 17 g one packet daily. 9. Benadryl 50 mg 1 capsule daily as needed. 10.Crestor 40 mg 1 tablet daily. 11.Humalog insulin 25 units daily. 12.Lantus insulin 65 units before meals. 13.Lasix 80 mg 2 tablets twice daily. 14.Morphine 15 mg 1 tablet twice daily as needed. 15.Phenergan 25 mg 1 tablet every 6 hours as needed. 16.Percocet 5/325 mg 2 tablets every 4 hours as needed. 17.Prevacid 30 mg 1 capsule every morning. 18.Anusol hemorrhoidal suppository per rectum, 1 suppository as     needed. 19.Synthroid 112 mcg 1 tablet  daily. 20.Calcitriol 0.5 mcg 1 daily.  ALLERGIES:  Multiple allergies including: 1. HYDROCODONE. 2. PENTAZOCINE. 3. AMITRIPTYLINE. 4. Prudy Feeler. 5. DOXEPIN. 6. KLONOPIN. 7. TRANXENE. 8. METHADONE. 9. PHENOBARBITAL. 10.CODEINE. 11.DILTIAZEM. 12.FLUOXETINE. 13.GABAPENTIN. 14.LEVOFLOXACIN. 15.LISINOPRIL. 16.SPIRIVA. 17.PHENOBARBITAL. 18.COUMADIN. 19.AMBIEN. 20.ROFECOXIB.  REVIEW OF SYSTEMS:  Positive for chronic back pain.  Positive for bladder incontinence.  Positive for bilateral lower extremity erythema. All other systems reviewed and negative except that were noted above.  PHYSICAL ASSESSMENT:   VITAL SIGNS:  Temperature 98.4, heart rate 81, respirations 20, blood pressure 182/79, O2 saturation 95% on room air. GENERAL:  Ms. Hornaday is a 77 year old white female in no acute distress. HEENT:  No scleral icterus.  Conjunctivae pink. NECK:  Supple.  No palpable lymphadenopathy. CARDIAC:  Regular rate and rhythm.  No appreciable murmurs. RESPIRATORY:  Bilateral lungs are clear to auscultation. GI:  Normoactive bowel sounds.  The abdomen is soft, obese.  Mild right upper quadrant tenderness.  No obvious masses. EXTREMITIES:  2+ bilateral lower extremity edema.  Erythema of the bilateral lower extremities from feet to the mid calf, greatest on the right lower extremity. SKIN:  No other significant skin lesions. NEUROLOGIC:  Alert and oriented. PSYCHOLOGIC:  Cooperative.  LABORATORY STUDIES:  Pending.  ASSESSMENT: 1.  Choledocholithiasis, s/p ERCP with removal of stones, sphincterotomy, and     biliary stent placement in September. Now in need of repeat ERCP      with Dr. Leone Payor for stent removal and removal of any remaining bile duct stones. 2.  Obesity 3.  CKD 4.  Diabetes 5.  Chronic back pain 6.  Neurogenic bladder 7.  Polymyalgia Rheumatica 8.  History of Rheumatic fever 9.  History of spinal cord injury 10. Neurogenic bladder 11. Fatty liver disease. 12. History of small bowel obstruction 13. Carotid artery disease 14. History of PUD  PLAN: 1. Admit in preparation for am ERCP with Propofol by Dr. Leone Payor for stent removal and removal of    any remaining bile duct stones. 2. Accuchecks with sliding scale insulin 3. Continue home medications 4. Basic labs today. 5. DVT prevention with SCDs.  Willette Cluster, NP   ______________________________ Iva Boop, MD,FACG    PG/MEDQ  D:  12/25/2010  T:  12/25/2010  Job:  [id number]  Electronically Signed by Willette Cluster NP on 12/27/2010 12:02:17 PM Electronically Signed by Stan Head MDFACG on 12/28/2010  01:11:57 PM

## 2011-01-06 ENCOUNTER — Encounter: Payer: Self-pay | Admitting: Family Medicine

## 2011-01-06 ENCOUNTER — Ambulatory Visit (INDEPENDENT_AMBULATORY_CARE_PROVIDER_SITE_OTHER): Payer: Medicare Other | Admitting: Family Medicine

## 2011-01-06 VITALS — BP 142/58 | HR 87 | Temp 98.4°F

## 2011-01-06 DIAGNOSIS — R040 Epistaxis: Secondary | ICD-10-CM

## 2011-01-06 DIAGNOSIS — M79609 Pain in unspecified limb: Secondary | ICD-10-CM

## 2011-01-06 DIAGNOSIS — E119 Type 2 diabetes mellitus without complications: Secondary | ICD-10-CM

## 2011-01-06 DIAGNOSIS — K8050 Calculus of bile duct without cholangitis or cholecystitis without obstruction: Secondary | ICD-10-CM

## 2011-01-06 MED ORDER — INSULIN PEN NEEDLE 29G X 8MM MISC: Status: DC

## 2011-01-06 MED ORDER — INSULIN GLARGINE 100 UNIT/ML ~~LOC~~ SOLN: 70.0000 [IU] | Freq: Every day | SUBCUTANEOUS | Status: DC

## 2011-01-06 MED ORDER — INSULIN LISPRO 100 UNIT/ML ~~LOC~~ SOLN: SUBCUTANEOUS | Status: DC

## 2011-01-06 NOTE — Patient Instructions (Signed)
Check with your insurance to see if they will cover the shingles shot. I'll send in the rx for the pens for the insulin. Don't change your meds for now.  You can get your results through our phone system.  Follow the instructions on the blue card. Take care.

## 2011-01-06 NOTE — Progress Notes (Signed)
R sided nose bleed.  Had NGT on that side at [facility] prev.  She would put pressure on the nostril.  Had pulled a clot that was 2" per R nostil.  She's still stuffy on that side.  She had seen Dr. Andee Poles with ENT prev.    Occ abd pain and nausea (better with zofran and mylanta).  Had stone removed via EGD on 12/25/10 and is improved in meantime.    DM2.  Recent sugars reviewed.  Adherent to diet and meds.  Can't use syringes easily due to pain in hands . Asking about changing to pens.  This is reasonable. Sugar have been ~100-140s in AM, usually similar pre meal later in the day.  2h post prandials are ~150-200.    PMH and SH reviewed  ROS: See HPI, otherwise noncontributory.  Meds, vitals, and allergies reviewed.   nad ncat Mmm rrr with occ ectopy ctab abd soft, +BS Ext with 1-2+ BLE edema

## 2011-01-07 ENCOUNTER — Encounter: Payer: Self-pay | Admitting: Family Medicine

## 2011-01-07 DIAGNOSIS — R040 Epistaxis: Secondary | ICD-10-CM | POA: Insufficient documentation

## 2011-01-07 LAB — HEMOGLOBIN A1C: Hgb A1c MFr Bld: 6 % (ref 4.6–6.5)

## 2011-01-07 NOTE — Assessment & Plan Note (Signed)
Continued, given her hand pain it is reasonable to change to pens for insulin. rx sent.

## 2011-01-07 NOTE — Assessment & Plan Note (Signed)
F/u with ENT if continued.

## 2011-01-07 NOTE — Progress Notes (Signed)
Addended by: Lars Mage on: 01/07/2011 02:05 PM   Modules accepted: Orders

## 2011-01-07 NOTE — Assessment & Plan Note (Signed)
abd pain improved.  No change in meds.

## 2011-01-07 NOTE — Assessment & Plan Note (Signed)
Check A1c before we adjust meds. Given her hand pain it is reasonable to change to pens for insulin. rx sent.  >25 min spent with face to face with patient, >50% counseling and/or coordinating care.

## 2011-01-23 ENCOUNTER — Telehealth: Payer: Self-pay

## 2011-01-23 NOTE — Telephone Encounter (Signed)
She'll send the letter for clarification to me and I'll address it.  She'll call the rn with the pharmacy about using the pens.  I'll await the letter from the patient.

## 2011-01-23 NOTE — Telephone Encounter (Signed)
Pt received Humalog pen but did not receive Lantus pen but did get a letter that clarification is needed for Lantus pen. Pt also does not know how to use insulin pen.Please advise. Pt can be reached [phone].

## 2011-01-24 ENCOUNTER — Other Ambulatory Visit: Payer: Self-pay | Admitting: *Deleted

## 2011-01-24 MED ORDER — INSULIN GLARGINE 100 UNIT/ML ~~LOC~~ SOLN: 70.0000 [IU] | Freq: Every day | SUBCUTANEOUS | Status: DC

## 2011-01-24 MED ORDER — BUDESONIDE-FORMOTEROL FUMARATE 160-4.5 MCG/ACT IN AERO: 2.0000 | INHALATION_SPRAY | Freq: Two times a day (BID) | RESPIRATORY_TRACT | Status: DC

## 2011-01-24 MED ORDER — INSULIN PEN NEEDLE 29G X 8MM MISC: Status: DC

## 2011-01-24 NOTE — Telephone Encounter (Signed)
Please notify pt that I sent a new rx for lantus pens and needles.  I sent the symbicort, too.  Thanks.

## 2011-01-24 NOTE — Telephone Encounter (Signed)
Patient advised.

## 2011-01-24 NOTE — Telephone Encounter (Signed)
Pt faxed form from pharmacy, I spoke with her this am and she says she needs the solostar pens and pen needles.  She also requested 90 day rx for Symbicort sent to [facility]

## 2011-02-11 ENCOUNTER — Telehealth: Payer: Self-pay | Admitting: Internal Medicine

## 2011-02-11 NOTE — Telephone Encounter (Signed)
This is okay to do

## 2011-02-11 NOTE — Telephone Encounter (Signed)
Patient wanted to know if she could inject her insulin in her thigh because her arthritis in her hand is too much and she can't get all the units injected.  I informed her it would be fine but she wants your approval.  Please advise.

## 2011-02-12 NOTE — Telephone Encounter (Signed)
Patient advised.

## 2011-02-26 ENCOUNTER — Telehealth: Payer: Self-pay | Admitting: Internal Medicine

## 2011-02-26 MED ORDER — ROSUVASTATIN CALCIUM 40 MG PO TABS: 40.0000 mg | ORAL_TABLET | Freq: Every day | ORAL | Status: DC

## 2011-02-26 MED ORDER — "INSULIN SYRINGE-NEEDLE U-100 30G X 1/2"" 0.5 ML MISC": Status: DC

## 2011-02-26 MED ORDER — INSULIN LISPRO 100 UNIT/ML ~~LOC~~ SOLN: SUBCUTANEOUS | Status: DC

## 2011-02-26 MED ORDER — INSULIN GLARGINE 100 UNIT/ML ~~LOC~~ SOLN: 70.0000 [IU] | Freq: Every day | SUBCUTANEOUS | Status: DC

## 2011-02-26 NOTE — Telephone Encounter (Signed)
Patient called and stated it is hard for her to use the pen with the Lantus.  When she goes to inject it most of it runs down her abdomen.  She wants to switch to the vial on her Lantus and Humalog to make it easier on her arthritis.  She also needs refill on syringes and crestor.  Please advise.

## 2011-02-26 NOTE — Telephone Encounter (Signed)
Rxs sent

## 2011-03-06 ENCOUNTER — Telehealth: Payer: Self-pay | Admitting: Internal Medicine

## 2011-03-06 NOTE — Telephone Encounter (Signed)
Can you please confirm with [facility] that [facility] sent her 100 units syringes, that's what's in the chart but she is saying that they only sent her 50 unit and she can't use them so she needs a new Rx.

## 2011-03-07 ENCOUNTER — Other Ambulatory Visit: Payer: Self-pay | Admitting: *Deleted

## 2011-03-07 MED ORDER — "INSULIN SYRINGE 30G X 1/2"" 1 ML MISC": Status: DC

## 2011-03-11 ENCOUNTER — Other Ambulatory Visit: Payer: Self-pay | Admitting: *Deleted

## 2011-03-11 MED ORDER — "INSULIN SYRINGE 30G X 1/2"" 1 ML MISC": Status: DC

## 2011-03-23 ENCOUNTER — Encounter: Payer: Self-pay | Admitting: Family Medicine

## 2011-03-24 ENCOUNTER — Other Ambulatory Visit: Payer: Self-pay | Admitting: Family Medicine

## 2011-03-31 ENCOUNTER — Other Ambulatory Visit: Payer: Self-pay | Admitting: Cardiology

## 2011-03-31 DIAGNOSIS — I6529 Occlusion and stenosis of unspecified carotid artery: Secondary | ICD-10-CM

## 2011-04-01 ENCOUNTER — Encounter (INDEPENDENT_AMBULATORY_CARE_PROVIDER_SITE_OTHER): Payer: Medicare Other | Admitting: *Deleted

## 2011-04-01 DIAGNOSIS — I6529 Occlusion and stenosis of unspecified carotid artery: Secondary | ICD-10-CM

## 2011-04-03 ENCOUNTER — Telehealth: Payer: Self-pay | Admitting: Family Medicine

## 2011-04-03 NOTE — Telephone Encounter (Signed)
Armandina Stammer called patient on December 10,2012 and she denied services.  Maribeth sent her a packet of information about [facility].

## 2011-04-04 NOTE — Telephone Encounter (Signed)
Noted  

## 2011-04-07 ENCOUNTER — Ambulatory Visit: Payer: Medicare Other | Admitting: Cardiovascular Disease

## 2011-04-28 ENCOUNTER — Encounter: Payer: Self-pay | Admitting: Cardiovascular Disease

## 2011-04-28 ENCOUNTER — Ambulatory Visit (INDEPENDENT_AMBULATORY_CARE_PROVIDER_SITE_OTHER): Payer: Medicare Other | Admitting: Cardiovascular Disease

## 2011-04-28 VITALS — BP 127/60 | HR 57 | Ht 64.0 in | Wt 220.0 lb

## 2011-04-28 DIAGNOSIS — I739 Peripheral vascular disease, unspecified: Secondary | ICD-10-CM

## 2011-04-28 DIAGNOSIS — E785 Hyperlipidemia, unspecified: Secondary | ICD-10-CM

## 2011-04-28 DIAGNOSIS — I4891 Unspecified atrial fibrillation: Secondary | ICD-10-CM

## 2011-04-28 DIAGNOSIS — R609 Edema, unspecified: Secondary | ICD-10-CM

## 2011-04-28 NOTE — Assessment & Plan Note (Signed)
Edema is likely multifactorial. She takes Lasix on a regular basis. We have suggested she hold the afternoon dose for dehydration , Dry mouth, fatigue, dizziness.

## 2011-04-28 NOTE — Assessment & Plan Note (Signed)
Repeat carotid ultrasound scheduled later this year for 40-59% carotid arterial disease. Stable.

## 2011-04-28 NOTE — Progress Notes (Signed)
Patient ID: [name], female    DOB: [birth year], 78 y.o.   MRN: [id number]  HPI Comments: Ms [name] he is a 78 year old woman with chronic atrial fibrillation, long smoking history, history of subdural hematoma while on warfarin (No followup with neurology in 7 years), chronic renal insufficiency on diuretics with history of spinal cord injury and neurogenic bladder, peripheral vascular disease with 40-59% left internal carotid arterial disease in 2008 who presents for routine followup. H/o GI stricture   She reports that she has had severe back pain she walks in her trailer short distances but does not do any regular exercise. She does not go out very much. She is tolerating her medications without difficulty. She sometimes has fatigue   She did stop smoking 6 years ago but does have a long smoking history.   uses a scooter when she leaves he trailer. She denies any significant chest pain. With exertion, sometimes she has tachycardia palpitations. She takes her metoprolol in the late afternoon.   EKG shows atrial fibrillation with ventricular rate 57 beats per minute, no significant ST or T wave changes      Outpatient Encounter Prescriptions as of 04/28/2011  Medication Sig Dispense Refill  . ATROVENT HFA 17 MCG/ACT inhaler INHALE 2 PUFFS 2 TIMES A DAY  3 Inhaler  3  . budesonide-formoterol (SYMBICORT) 160-4.5 MCG/ACT inhaler Inhale 2 puffs into the lungs 2 (two) times daily.  3 Inhaler  3  . diazepam (VALIUM) 5 MG tablet Take 5 mg by mouth every 6 (six) hours as needed.        . digoxin (LANOXIN) 0.125 MG tablet Take 1 tablet (125 mcg total) by mouth daily.  15 tablet  0  . digoxin (LANOXIN) 0.125 MG tablet TAKE 1 TABLET BY MOUTH DAILY  90 tablet  2  . diphenhydrAMINE (BENADRYL) 50 MG tablet Take 50 mg by mouth at bedtime as needed.        Marland Kitchen epoetin alfa (PROCRIT) 40981 UNIT/ML injection Inject 10,000 Units into the skin 3 (three) times a week.        . furosemide (LASIX) 80 MG tablet  TAKE 2 TABLETS BY MOUTH TWICE DAILY  360 tablet  2  . glycerin adult (GLYCERIN ADULT) 2 G SUPP Place 1 suppository rectally as needed.        . hydrocortisone (ANUSOL-HC) 25 MG suppository Place 25 mg rectally as needed.        . insulin glargine (LANTUS SOLOSTAR) 100 UNIT/ML injection Inject 70 Units into the skin daily. Disp 90 mL, ie 9 vials, for 90 day supply  90 mL  3  . insulin lispro (HUMALOG) 100 UNIT/ML injection Inject per sliding scale, 3 times a day.  Up to 70 units a day. 9 vials for 90 day supply.  90 mL  3  . Insulin Syringe-Needle U-100 (INSULIN SYRINGE 1CC/30GX1/2") 30G X 1/2" 1 ML MISC Use as directed for insulin injections.  Dx:  250.00  400 each  3  . Lansoprazole (PREVACID PO) Take 20 mg by mouth 1 day or 1 dose.        . levothyroxine (SYNTHROID, LEVOTHROID) 112 MCG tablet TAKE 1 TABLET BY MOUTH DAILY  90 tablet  0  . metoprolol (TOPROL-XL) 100 MG 24 hr tablet Take 1 tablet (100 mg total) by mouth daily.  90 tablet  4  . Mirabegron ER (MYRBETRIQ) 50 MG TB24 Take 1 tablet by mouth daily.      Marland Kitchen morphine (MSIR) 15  MG tablet 2 times daily as needed      . ondansetron (ZOFRAN) 4 MG tablet Take 1 tablet (4 mg total) by mouth every 8 (eight) hours as needed for nausea.  30 tablet  5  . oxyCODONE-acetaminophen (PERCOCET) 10-325 MG per tablet Take 2 tablets by mouth four times a day with extra 2 for flare ups as needed       . polyethylene glycol powder (MIRALAX) powder Take 17 g by mouth 2 (two) times daily.  255 g    . promethazine (PHENERGAN) 25 MG tablet Take 1 tablet (25 mg total) by mouth every 6 (six) hours as needed for nausea.  30 tablet  5  . ROCALTROL 0.5 MCG capsule TAKE 1 CAPSULE BY MOUTH DAILY  90 capsule  2  . rosuvastatin (CRESTOR) 40 MG tablet Take 1 tablet (40 mg total) by mouth daily. At bedtime  90 tablet  3  . SYNTHROID 112 MCG tablet TAKE 1 TABLET BY MOUTH DAILY  90 tablet  2    Review of Systems  HENT: Negative.   Eyes: Negative.   Respiratory: Negative.     Cardiovascular: Positive for leg swelling.  Gastrointestinal: Negative.   Musculoskeletal: Positive for back pain.  Skin: Negative.   Neurological: Negative.   Hematological: Negative.   Psychiatric/Behavioral: Negative.   All other systems reviewed and are negative.    BP 127/60  Pulse 57  Ht 5\' 4"  (1.626 m)  Wt 220 lb (99.791 kg)  BMI 37.76 kg/m2  Physical Exam  Nursing note and vitals reviewed. Constitutional: She is oriented to person, place, and time. She appears well-developed and well-nourished.       Presents in a wheelchair. Obese.  HENT:  Head: Normocephalic.  Nose: Nose normal.  Mouth/Throat: Oropharynx is clear and moist.  Eyes: Conjunctivae are normal. Pupils are equal, round, and reactive to light.  Neck: Normal range of motion. Neck supple. No JVD present. Carotid bruit is present.  Cardiovascular: Normal rate, S1 normal, S2 normal and intact distal pulses.  An irregularly irregular rhythm present. Exam reveals no gallop and no friction rub.   Murmur heard.  Crescendo systolic murmur is present with a grade of 2/6       Trace bilateral leg edema  Pulmonary/Chest: Effort normal and breath sounds normal. No respiratory distress. She has no wheezes. She has no rales. She exhibits no tenderness.  Abdominal: Soft. Bowel sounds are normal. She exhibits no distension. There is no tenderness.  Musculoskeletal: Normal range of motion. She exhibits no edema and no tenderness.  Lymphadenopathy:    She has no cervical adenopathy.  Neurological: She is alert and oriented to person, place, and time. Coordination normal.  Skin: Skin is warm and dry. No rash noted. No erythema.  Psychiatric: She has a normal mood and affect. Her behavior is normal. Judgment and thought content normal.         Assessment and Plan

## 2011-04-28 NOTE — Patient Instructions (Signed)
You are doing well. No medication changes were made. Try a half dose of toprol if you feel very fatigued or heart rate is slow. You could always do the other half at night  Please call us if you have new issues that need to be addressed before your next appt.  Your physician wants you to follow-up in: 6 months.  You will receive a reminder letter in the mail two months in advance. If you don't receive a letter, please call our office to schedule the follow-up appointment.

## 2011-04-28 NOTE — Assessment & Plan Note (Signed)
Rate is well-controlled. Bradycardia on today's visit, slower on auscultation.. we have suggested she could try cutting her Toprol in half with close monitoring of her heart rate. She does not want any anticoagulation.

## 2011-04-28 NOTE — Assessment & Plan Note (Signed)
Continue Crestor given moderate carotid arterial disease. Goal LDL less than 70

## 2011-05-20 ENCOUNTER — Other Ambulatory Visit: Payer: Self-pay | Admitting: Family Medicine

## 2011-05-20 NOTE — Telephone Encounter (Signed)
Electronic refill request

## 2011-05-20 NOTE — Telephone Encounter (Signed)
Sent!

## 2011-05-30 ENCOUNTER — Other Ambulatory Visit: Payer: Self-pay | Admitting: Family Medicine

## 2011-05-30 NOTE — Telephone Encounter (Signed)
Electronic refill request

## 2011-05-30 NOTE — Telephone Encounter (Signed)
Sent!

## 2011-06-12 ENCOUNTER — Other Ambulatory Visit: Payer: Self-pay | Admitting: Family Medicine

## 2011-06-12 NOTE — Telephone Encounter (Signed)
Electronic refill request

## 2011-06-12 NOTE — Telephone Encounter (Signed)
Is this a medication that I can refill or does it need to be approved by you?

## 2011-06-12 NOTE — Telephone Encounter (Signed)
Clarify with pharm, this should have been done 05/30/11.

## 2011-06-12 NOTE — Telephone Encounter (Signed)
Doesn't need approval from me, should have already been done.

## 2011-07-25 ENCOUNTER — Telehealth: Payer: Self-pay

## 2011-07-25 NOTE — Telephone Encounter (Signed)
Agreed, if not acute changes and she can't come today, then will see Monday.  If acute changes/decompensation in meantime, then would need ER eval.  Would need to have family monitoring her in meantime.

## 2011-07-25 NOTE — Telephone Encounter (Signed)
Patient advised.

## 2011-07-25 NOTE — Telephone Encounter (Signed)
Pt request appt Mon for confusion, some things pt says makes no sense.Family noticed for 1 month. For 3 months FBS going up;today 229. No h/a, dizziness,chest pain,difficulty breathing,N&V or sweating. Pt state hx of blood hemorrhage 5 yrs ago. Offered appt for today; pt has no way to come. 30 min appt scheduled 07/28/11 at 10:30. Advised pt if symptoms change or worsen over weekend to go to [facility] or ER.Please advise.

## 2011-07-28 ENCOUNTER — Encounter: Payer: Self-pay | Admitting: Family Medicine

## 2011-07-28 ENCOUNTER — Ambulatory Visit (INDEPENDENT_AMBULATORY_CARE_PROVIDER_SITE_OTHER): Payer: Medicare Other | Admitting: Family Medicine

## 2011-07-28 VITALS — BP 106/60 | HR 65 | Temp 97.8°F | Wt 219.0 lb

## 2011-07-28 DIAGNOSIS — E119 Type 2 diabetes mellitus without complications: Secondary | ICD-10-CM

## 2011-07-28 DIAGNOSIS — R4182 Altered mental status, unspecified: Secondary | ICD-10-CM | POA: Insufficient documentation

## 2011-07-28 LAB — COMPREHENSIVE METABOLIC PANEL
ALT: 13 U/L (ref 0–35)
AST: 15 U/L (ref 0–37)
CO2: 33 mEq/L — ABNORMAL HIGH (ref 19–32)
Calcium: 9.3 mg/dL (ref 8.4–10.5)
Chloride: 99 mEq/L (ref 96–112)
GFR: 40.71 mL/min — ABNORMAL LOW (ref >60.00)
Sodium: 141 mEq/L (ref 135–145)
Total Protein: 6.6 g/dL (ref 6.0–8.3)

## 2011-07-28 LAB — CBC WITH DIFFERENTIAL/PLATELET
Basophils Absolute: 0.1 10*3/uL (ref 0.0–0.1)
Eosinophils Absolute: 0.4 10*3/uL (ref 0.0–0.7)
HCT: 36.4 % (ref 36.0–46.0)
Hemoglobin: 11.9 g/dL — ABNORMAL LOW (ref 12.0–15.0)
Lymphocytes Relative: 20.3 % (ref 12.0–46.0)
Lymphs Abs: 2 10*3/uL (ref 0.7–4.0)
MCHC: 32.6 g/dL (ref 30.0–36.0)
Monocytes Relative: 9.4 % (ref 3.0–12.0)
Neutro Abs: 6.3 10*3/uL (ref 1.4–7.7)
Platelets: 242 10*3/uL (ref 150.0–400.0)
RDW: 16.1 % — ABNORMAL HIGH (ref 11.5–14.6)

## 2011-07-28 LAB — LIPID PANEL: HDL: 32.9 mg/dL — ABNORMAL LOW (ref >39.00)

## 2011-07-28 LAB — TSH: TSH: 5.53 u[IU]/mL — ABNORMAL HIGH (ref 0.35–5.50)

## 2011-07-28 LAB — LDL CHOLESTEROL, DIRECT: Direct LDL: 48.5 mg/dL

## 2011-07-28 LAB — HEMOGLOBIN A1C: Hgb A1c MFr Bld: 7.2 % — ABNORMAL HIGH (ref 4.6–6.5)

## 2011-07-28 NOTE — Progress Notes (Signed)
Here today with niece.  She had noted gradual changes in the last few months with her memory.  Family had noted recently, too.  Trouble remembering where she put grocery list (also what she put on it and why the items are listed), her spelling was noted to be different.  Her sleep cycle was disrupted and she was sleeping later in the day occ, would get drowsy and nod off during a conversation.    Today and yesterday she has been much better, per patient and family.   Some days are worse than others.  Speech is at baseline now but prev had some slurred speech when she was drowsy (this appeared to be related to being drowsy and not a new focal deficit).  No change in motor function for arms or legs.  No new focal neuro deficits.  No facial droop. No more trouble swallowing than baseline.  She's had episodic trouble with that since the prev CVA.  No FCNAVD. Sugar has been controlled recently.   No med changes recently.  On BP meds and pain meds chronically. She'll see Dr. Lisabeth Pick tomorrow.   PMH and SH reviewed  ROS: See HPI, otherwise noncontributory.  Meds, vitals, and allergies reviewed.   nad ncat In wheelchair Speech at baseline Oriented to year, place, self.  Mmm IRR but not tachy ctab abd soft, obese Ext with edema at baseline Moves ext at baseline, normal motor exam in BUE but old dec in strength in BLE Sensation intact x4 CN 2-12 wnl.  Affect and judgement wnl.

## 2011-07-28 NOTE — Patient Instructions (Signed)
Go to the lab on the way out.  We'll contact you with your lab report. Take to Dr. Lisabeth Pick about these episodes.  Let me know if you continue to have more troubles.  Take care.  Plan on recheck in 6 months, sooner if needed.

## 2011-07-28 NOTE — Assessment & Plan Note (Signed)
Now apparently resolved and not acute.  Unclear source (renal/hepatic/CVA/med related?).  Will hold on CT head at this point as she is improved recently.  Check routine labs (along with lipids and A1c) due to DM2 and notify pt.  She'll d/w Dr. Lisabeth Pick tomorrow.  She agrees with plan.  >25 min spent with face to face with patient, >50% counseling and/or coordinating care.  If sx return, she or family will notify me.

## 2011-07-29 ENCOUNTER — Encounter: Payer: Self-pay | Admitting: *Deleted

## 2011-07-29 ENCOUNTER — Other Ambulatory Visit: Payer: Self-pay | Admitting: Family Medicine

## 2011-07-29 DIAGNOSIS — E119 Type 2 diabetes mellitus without complications: Secondary | ICD-10-CM

## 2011-08-20 ENCOUNTER — Other Ambulatory Visit: Payer: Self-pay | Admitting: Cardiovascular Disease

## 2011-08-20 ENCOUNTER — Other Ambulatory Visit: Payer: Self-pay | Admitting: Family Medicine

## 2011-08-26 ENCOUNTER — Other Ambulatory Visit: Payer: Self-pay | Admitting: Family Medicine

## 2011-08-26 NOTE — Telephone Encounter (Signed)
Sent!

## 2011-08-26 NOTE — Telephone Encounter (Signed)
Electronic refill request

## 2011-09-02 ENCOUNTER — Telehealth: Payer: Self-pay | Admitting: Family Medicine

## 2011-09-02 ENCOUNTER — Other Ambulatory Visit: Payer: Self-pay | Admitting: Cardiovascular Disease

## 2011-09-02 ENCOUNTER — Other Ambulatory Visit: Payer: Self-pay | Admitting: Family Medicine

## 2011-09-02 NOTE — Telephone Encounter (Signed)
Will see patient at OV 09/04/11.

## 2011-09-02 NOTE — Telephone Encounter (Signed)
Refill sent for metoprolol succ 100 mg take one tablet daily. [facility] to [facility].

## 2011-09-02 NOTE — Telephone Encounter (Signed)
Pt request appt to discuss blood sugar still being elevated. Pt did not give bs values but said was not too high to wait for appt on 09/04/11. Pt request date due to transportation.

## 2011-09-04 ENCOUNTER — Encounter: Payer: Self-pay | Admitting: Family Medicine

## 2011-09-04 ENCOUNTER — Ambulatory Visit (INDEPENDENT_AMBULATORY_CARE_PROVIDER_SITE_OTHER): Payer: Medicare Other | Admitting: Family Medicine

## 2011-09-04 VITALS — BP 132/68 | HR 60 | Temp 98.2°F | Ht 64.0 in | Wt 218.4 lb

## 2011-09-04 DIAGNOSIS — E119 Type 2 diabetes mellitus without complications: Secondary | ICD-10-CM

## 2011-09-04 MED ORDER — INSULIN GLARGINE 100 UNIT/ML ~~LOC~~ SOLN
80.0000 [IU] | Freq: Every day | SUBCUTANEOUS | Status: DC
Start: 2011-09-04 — End: 2012-03-19

## 2011-09-04 MED ORDER — INSULIN GLARGINE 100 UNIT/ML ~~LOC~~ SOLN
80.0000 [IU] | Freq: Every day | SUBCUTANEOUS | Status: DC
Start: 2011-09-04 — End: 2011-09-04

## 2011-09-04 NOTE — Progress Notes (Signed)
DM2 f/u.  Now on lantus 80 units a day with her sliding scale.  Sugar had been elevated slightly, but is better this week.  AM sugars 100-140 this week.  Last A1c about 1 month ago, 7.2.  Prev was 6.0.  No sig diet change.  Compliant with meds.  She thinks the biggest upheaval/change is the consideration of moving to [place] to be closer to family.  She would need a move-in ready living space with modifications along with a new panel of doctors.  She is worried about the change and is considering options.  We discussed today.  Her mentation is back to baseline and her thoughts are clear.    Meds, vitals, and allergies reviewed.   ROS: See HPI.  Otherwise, noncontributory.  nad ncat In wheelchair, at baseline rrr ctab abd soft, obese  Recent sugar log reviewed.

## 2011-09-04 NOTE — Assessment & Plan Note (Signed)
We don't want to induce hypoglycemia.  Her sugars are recently improved, ie this week.  Will continue as is, recheck A1c in 2 months.  She'll notify me if sig change in meantime.  I think the stress of potential move is playing a role.  I'll do what I can to help with the move, should she elect for that.  Her thoughts are clear in meantime and we'll talk about this again at the next OV.  She agrees.  App help of Dr. Resa Miner with this pleasant patient.

## 2011-09-04 NOTE — Patient Instructions (Addendum)
Don't change your meds for now and we'll recheck your A1c in a 9/13.  Take care.  Call with questions.

## 2011-09-08 ENCOUNTER — Ambulatory Visit: Payer: Medicare Other | Admitting: Family Medicine

## 2011-10-02 ENCOUNTER — Telehealth: Payer: Self-pay

## 2011-10-02 ENCOUNTER — Encounter: Payer: Self-pay | Admitting: Family Medicine

## 2011-10-02 ENCOUNTER — Telehealth: Payer: Self-pay | Admitting: Family Medicine

## 2011-10-02 ENCOUNTER — Ambulatory Visit (INDEPENDENT_AMBULATORY_CARE_PROVIDER_SITE_OTHER): Payer: Medicare Other | Admitting: Family Medicine

## 2011-10-02 VITALS — BP 110/64 | HR 88 | Temp 98.2°F

## 2011-10-02 DIAGNOSIS — M799 Soft tissue disorder, unspecified: Secondary | ICD-10-CM | POA: Insufficient documentation

## 2011-10-02 MED ORDER — ZOSTER VACCINE LIVE 19400 UNT/0.65ML ~~LOC~~ SOLR: 0.6500 mL | Freq: Once | SUBCUTANEOUS | Status: AC

## 2011-10-02 NOTE — Patient Instructions (Addendum)
Use a small pillow in the chair for your lower back.  I don't think this is anything troublesome otherwise.

## 2011-10-02 NOTE — Telephone Encounter (Signed)
Will see pt today.  

## 2011-10-02 NOTE — Telephone Encounter (Signed)
Dr Para March need be seen. Scheduled pt today at 3 pm with Dr Dani Gobble condition changes or worsens pt should go to ER. Jeanine verbalizes understanding and will notify pt.

## 2011-10-02 NOTE — Progress Notes (Signed)
Isolated sore area on the skin on the L flank, noted recently.  Had been sitting at computer working recently.  No trauma.  No erythema.  No fevers.  At baseline health o/w.  Wanted eval.   Meds, vitals, and allergies reviewed.   ROS: See HPI.  Otherwise, noncontributory.  nad rrr ctab No erythema or rash but the L side of back has a slightly more prominent pannus noted.  No focal mass.

## 2011-10-02 NOTE — Telephone Encounter (Signed)
Jeanine calling back; pt refuses to go to ED due to previous ER visits;pt request order to go to [facility] imaging. Pt pain level now is reduced to 3. Lump or swelling is 3" in diameter.Please advise.

## 2011-10-02 NOTE — Telephone Encounter (Signed)
Agreed -

## 2011-10-02 NOTE — Telephone Encounter (Signed)
Caller: Briggett/Patient; PCP: Crawford Givens Clelia Croft); CB#: [phone];  Call regarding Pain But On L. Side At Waist Has 3 inch diameter round firm swelling that is tender to touch, No warmth or Redness- onset 09/28/11 Hurts to take deep breath, move in anyway. Pain level #6 on 1-10 scale-  took MS 15 mgs at 1100 and helped some (pain is now #3). Afebrile. Blood Glucose normal. Hx Spinal Surgery/Neurogenic Bladder/ Unable to tell if UTI. Wears Briefs and Urine always smells strong. Known cyst on R kidney. Triage and Care advice per Flank Pain and Neck Lump or Swelling Protocols and ER advised but pt refusing. Appnt scheduled for 1500 -10/02/11. Pt agrees to appnt and will have family member bring her in.

## 2011-10-02 NOTE — Telephone Encounter (Signed)
Dr Para March asked if any redness; Jeanine said no redness; Dr Para March said if abscess will need be drained at hospital; if mass needs pictures and pt has high tolerance of pain. Dr Para March advised pt to ED for eval. Yehuda Mao will notify family.

## 2011-10-02 NOTE — Assessment & Plan Note (Signed)
Likely soft tissue compression that is posturally related.  No intervention other than lumbar support with a pillow.  F/u prn.  D/w pt.

## 2011-10-02 NOTE — Telephone Encounter (Signed)
[name] with [facility]; pt having lt lower backpain with swelling visible size of palm for 4 days. Painful when moves, tender to touch, cyst on rt kidney, pt takes Morphine 4 x daily. Today at 11 am pt took Morphine 30 mg and pain level went from 10 to 8. [name] advised ED but pt wanted to see if Dr Para March thought ED or see in office.Please advise.

## 2011-10-17 ENCOUNTER — Ambulatory Visit: Payer: Self-pay | Admitting: Urology

## 2011-10-31 ENCOUNTER — Encounter: Payer: Self-pay | Admitting: Cardiovascular Disease

## 2011-10-31 ENCOUNTER — Ambulatory Visit (INDEPENDENT_AMBULATORY_CARE_PROVIDER_SITE_OTHER): Payer: Medicare Other | Admitting: Cardiovascular Disease

## 2011-10-31 VITALS — BP 124/60 | HR 66 | Ht 64.0 in | Wt 215.8 lb

## 2011-10-31 DIAGNOSIS — I739 Peripheral vascular disease, unspecified: Secondary | ICD-10-CM

## 2011-10-31 DIAGNOSIS — E785 Hyperlipidemia, unspecified: Secondary | ICD-10-CM

## 2011-10-31 DIAGNOSIS — I4891 Unspecified atrial fibrillation: Secondary | ICD-10-CM

## 2011-10-31 DIAGNOSIS — E119 Type 2 diabetes mellitus without complications: Secondary | ICD-10-CM

## 2011-10-31 NOTE — Assessment & Plan Note (Signed)
Cholesterol is at goal on the current lipid regimen. No changes to the medications were made.  

## 2011-10-31 NOTE — Assessment & Plan Note (Signed)
Continue aggressive cholesterol management 

## 2011-10-31 NOTE — Assessment & Plan Note (Signed)
Heart rate is well controlled. We have suggested she start aspirin 81 mg daily. Not a warfarin candidate given previous subdural hematoma.

## 2011-10-31 NOTE — Progress Notes (Signed)
Patient ID: [name], female    DOB: [birth year], 78 y.o.   MRN: [id number]  HPI Comments: [name] he is a 78 year old woman with chronic atrial fibrillation, long smoking history, history of subdural hematoma while on warfarin (No followup with neurology in 7 years), chronic renal insufficiency on diuretics with history of spinal cord injury and neurogenic bladder, peripheral vascular disease with 40-59% left internal carotid arterial disease in 2008 who presents for routine followup. H/o GI stricture  Overall she is doing well. She was very stressed about moving out of town and this caused her to eat different foods, hemoglobin A1c climbing into the 7 range. She reports that she is doing much better and is "back on track". Lower extremity edema is relatively stable and mild. Most recent creatinine 1.3. She does notice occasional Lasix doses when she goes out of the house and has appointments. She denies any significant shortness of breath. She does not do any regular exercise. Stop smoking many years ago  She denies any significant chest pain.    EKG shows atrial fibrillation with ventricular rate 66 beats per minute, no significant ST or T wave changes      Outpatient Encounter Prescriptions as of 10/31/2011  Medication Sig Dispense Refill  . ATROVENT HFA 17 MCG/ACT inhaler INHALE 2 PUFFS 2 TIMES A DAY  3 Inhaler  3  . budesonide-formoterol (SYMBICORT) 160-4.5 MCG/ACT inhaler Inhale 2 puffs into the lungs 2 (two) times daily.  3 Inhaler  3  . calcitRIOL (ROCALTROL) 0.5 MCG capsule TAKE 1 CAPSULE BY MOUTH DAILY  90 capsule  3  . diazepam (VALIUM) 5 MG tablet Take 5 mg by mouth every 6 (six) hours as needed.        . digoxin (LANOXIN) 0.125 MG tablet Take 1 tablet (125 mcg total) by mouth daily.  15 tablet  0  . diphenhydrAMINE (BENADRYL) 50 MG tablet Take 50 mg by mouth at bedtime as needed.        Marland Kitchen epoetin alfa (PROCRIT) 86578 UNIT/ML injection Inject 10,000 Units into the skin 3  (three) times a week.        . furosemide (LASIX) 80 MG tablet Take 80 mg by mouth 2 (two) times daily.      . insulin glargine (LANTUS) 100 UNIT/ML injection Inject 80-90 Units into the skin daily.  10 mL  0  . insulin lispro (HUMALOG) 100 UNIT/ML injection Inject per sliding scale, 3 times a day.  Up to 70 units a day. 9 vials for 90 day supply.  90 mL  3  . Insulin Syringe-Needle U-100 (INSULIN SYRINGE 1CC/30GX1/2") 30G X 1/2" 1 ML MISC Use as directed for insulin injections.  Dx:  250.00  400 each  3  . Lansoprazole (PREVACID PO) Take 20 mg by mouth 1 day or 1 dose.        . levothyroxine (SYNTHROID, LEVOTHROID) 112 MCG tablet TAKE 1 TABLET BY MOUTH DAILY  90 tablet  0  . metoprolol succinate (TOPROL-XL) 100 MG 24 hr tablet TAKE 1 TABLET BY MOUTH DAILY  90 tablet  3  . Mirabegron ER (MYRBETRIQ) 50 MG TB24 Take 1 tablet by mouth daily.      Marland Kitchen morphine (MSIR) 15 MG tablet 2 times daily as needed      . omeprazole (PRILOSEC) 20 MG capsule TAKE 1 CAPSULE BY MOUTH 45 MINUTES PRIOR TO BREAKFAST  90 capsule  2  . ondansetron (ZOFRAN) 4 MG tablet Take 1 tablet (4  mg total) by mouth every 8 (eight) hours as needed for nausea.  30 tablet  5  . oxyCODONE-acetaminophen (PERCOCET) 10-325 MG per tablet Take 2 tablets by mouth four times a day with extra 2 for flare ups as needed       . polyethylene glycol powder (MIRALAX) powder Take 17 g by mouth 2 (two) times daily.  255 g    . promethazine (PHENERGAN) 25 MG tablet Take 1 tablet (25 mg total) by mouth every 6 (six) hours as needed for nausea.  30 tablet  5  . rosuvastatin (CRESTOR) 40 MG tablet Take 1 tablet (40 mg total) by mouth daily. At bedtime  90 tablet  3  . DISCONTD: furosemide (LASIX) 20 MG tablet Take 20 mg by mouth 2 (two) times daily. Take 2 tablets by mouth in AM and 1 tab in the PM.       . DISCONTD: furosemide (LASIX) 80 MG tablet TAKE 2 TABLETS BY MOUTH TWICE DAILY  360 tablet  0  . DISCONTD: glycerin adult (GLYCERIN ADULT) 2 G SUPP Place  1 suppository rectally as needed.        Marland Kitchen DISCONTD: hydrocortisone (ANUSOL-HC) 25 MG suppository Place 25 mg rectally as needed.        Marland Kitchen DISCONTD: levothyroxine (SYNTHROID, LEVOTHROID) 112 MCG tablet TAKE 1 TABLET BY MOUTH DAILY  90 tablet  0  . DISCONTD: metoprolol succinate (TOPROL-XL) 100 MG 24 hr tablet TAKE 1 TABLET BY MOUTH DAILY  90 tablet  3  . DISCONTD: ROCALTROL 0.5 MCG capsule TAKE 1 CAPSULE BY MOUTH DAILY  90 capsule  2  . DISCONTD: SYNTHROID 112 MCG tablet TAKE 1 TABLET BY MOUTH DAILY  90 tablet  2    Review of Systems  HENT: Negative.   Eyes: Negative.   Respiratory: Negative.   Gastrointestinal: Negative.   Musculoskeletal: Positive for back pain.  Skin: Negative.   Neurological: Negative.   Hematological: Negative.   Psychiatric/Behavioral: Negative.   All other systems reviewed and are negative.    BP 124/60  Pulse 66  Ht 5\' 4"  (1.626 m)  Wt 215 lb 12 oz (97.864 kg)  BMI 37.03 kg/m2  Physical Exam  Nursing note and vitals reviewed. Constitutional: She is oriented to person, place, and time. She appears well-developed and well-nourished.       Presents in a wheelchair. Obese.  HENT:  Head: Normocephalic.  Nose: Nose normal.  Mouth/Throat: Oropharynx is clear and moist.  Eyes: Conjunctivae are normal. Pupils are equal, round, and reactive to light.  Neck: Normal range of motion. Neck supple. No JVD present. Carotid bruit is present.  Cardiovascular: Normal rate, S1 normal, S2 normal and intact distal pulses.  An irregularly irregular rhythm present. Exam reveals no gallop and no friction rub.   Murmur heard.  Crescendo systolic murmur is present with a grade of 2/6       Trace bilateral leg edema  Pulmonary/Chest: Effort normal and breath sounds normal. No respiratory distress. She has no wheezes. She has no rales. She exhibits no tenderness.  Abdominal: Soft. Bowel sounds are normal. She exhibits no distension. There is no tenderness.  Musculoskeletal:  Normal range of motion. She exhibits no edema and no tenderness.  Lymphadenopathy:    She has no cervical adenopathy.  Neurological: She is alert and oriented to person, place, and time. Coordination normal.  Skin: Skin is warm and dry. No rash noted. No erythema.  Psychiatric: She has a normal mood and affect. Her  behavior is normal. Judgment and thought content normal.         Assessment and Plan

## 2011-10-31 NOTE — Patient Instructions (Addendum)
You are doing well. No medication changes were made.  Hold lasix periodically depending leg swelling  Please call us if you have new issues that need to be addressed before your next appt.  Your physician wants you to follow-up in: 6 months.  You will receive a reminder letter in the mail two months in advance. If you don't receive a letter, please call our office to schedule the follow-up appointment.

## 2011-10-31 NOTE — Assessment & Plan Note (Signed)
We have encouraged better diet control. She reports that she is back on track. Previously hemoglobin A1c was well controlled.

## 2011-11-05 ENCOUNTER — Other Ambulatory Visit: Payer: Medicare Other

## 2011-11-10 ENCOUNTER — Encounter: Payer: Self-pay | Admitting: Family Medicine

## 2011-11-10 ENCOUNTER — Ambulatory Visit (INDEPENDENT_AMBULATORY_CARE_PROVIDER_SITE_OTHER): Payer: Medicare Other | Admitting: Family Medicine

## 2011-11-10 VITALS — BP 130/60 | HR 80 | Temp 98.0°F | Wt 215.0 lb

## 2011-11-10 DIAGNOSIS — Z23 Encounter for immunization: Secondary | ICD-10-CM

## 2011-11-10 DIAGNOSIS — E119 Type 2 diabetes mellitus without complications: Secondary | ICD-10-CM

## 2011-11-10 DIAGNOSIS — E039 Hypothyroidism, unspecified: Secondary | ICD-10-CM

## 2011-11-10 LAB — HEMOGLOBIN A1C: Hgb A1c MFr Bld: 6.2 % (ref 4.6–6.5)

## 2011-11-10 MED ORDER — ZOSTER VACCINE LIVE 19400 UNT/0.65ML ~~LOC~~ SOLR: 0.6500 mL | Freq: Once | SUBCUTANEOUS | Status: DC

## 2011-11-10 NOTE — Patient Instructions (Addendum)
Check with your insurance to see if they will cover the shingles shot. Go to the lab on the way out.  We'll contact you with your lab report. Recheck in 6 months.  visit.  Labs at visit.  Take care.

## 2011-11-10 NOTE — Assessment & Plan Note (Signed)
Check TSH today, see notes on labs.   

## 2011-11-10 NOTE — Progress Notes (Signed)
"  Back on track" with diet and AM sugars ~100 and doing well.  Compliant with meds.  Sugar log reviewed.  No sig lows.    Moving bowels well w/o constipation.  Weight is stable ~214-218 and she has instructions re: lasix.  She can skip a dose as needed if she is going out of the house.    H/o hypothyroidism, due for recheck, compliant with meds and no new anterior neck pain/mass.   Meds, vitals, and allergies reviewed.   ROS: See HPI.  Otherwise, noncontributory.  nad  ncat  In wheelchair, at baseline  IRR, not tachy ctab  abd soft, obese Ext w/o edema

## 2011-11-10 NOTE — Assessment & Plan Note (Signed)
Check A1c today, see notes on labs.  Continue as is with meds.  Flu shot today.

## 2011-11-11 ENCOUNTER — Encounter: Payer: Self-pay | Admitting: *Deleted

## 2011-11-28 ENCOUNTER — Other Ambulatory Visit: Payer: Self-pay | Admitting: Family Medicine

## 2011-12-17 ENCOUNTER — Telehealth: Payer: Self-pay

## 2011-12-17 NOTE — Telephone Encounter (Signed)
Pt saw Dr Resa Miner at [facility]; pts pain management dr. Rock Nephew request recent OV and labs and any other info Dr Para March wants to send to Dr Resa Miner be faxed to [phone]; if need to speak with Dr Resa Miner call [phone].Please advise.

## 2011-12-17 NOTE — Telephone Encounter (Signed)
Please fax labs and OV notes from 6/13 to current.  Thanks.

## 2011-12-23 ENCOUNTER — Other Ambulatory Visit: Payer: Self-pay | Admitting: *Deleted

## 2011-12-23 MED ORDER — PROMETHAZINE HCL 25 MG PO TABS: 25.0000 mg | ORAL_TABLET | Freq: Four times a day (QID) | ORAL | Status: DC | PRN

## 2011-12-23 MED ORDER — ONDANSETRON HCL 4 MG PO TABS: 4.0000 mg | ORAL_TABLET | Freq: Three times a day (TID) | ORAL | Status: DC | PRN

## 2011-12-23 NOTE — Telephone Encounter (Signed)
Sent!

## 2011-12-29 ENCOUNTER — Other Ambulatory Visit: Payer: Self-pay | Admitting: Family Medicine

## 2012-02-26 ENCOUNTER — Inpatient Hospital Stay: Payer: Self-pay | Admitting: Student

## 2012-02-26 LAB — TROPONIN I
Troponin-I: 0.03 ng/mL
Troponin-I: <0.02 ng/mL

## 2012-02-26 LAB — COMPREHENSIVE METABOLIC PANEL
Alkaline Phosphatase: 147 U/L — ABNORMAL HIGH (ref 50–136)
BUN: 19 mg/dL — ABNORMAL HIGH (ref 7–18)
Bilirubin,Total: 0.6 mg/dL (ref 0.2–1.0)
Calcium, Total: 8.9 mg/dL (ref 8.5–10.1)
Chloride: 106 mmol/L (ref 98–107)
Co2: 28 mmol/L (ref 21–32)
EGFR (African American): >60
EGFR (Non-African Amer.): >60
Glucose: 148 mg/dL — ABNORMAL HIGH (ref 65–99)
SGOT(AST): 22 U/L (ref 15–37)
Sodium: 140 mmol/L (ref 136–145)
Total Protein: 7.6 g/dL (ref 6.4–8.2)

## 2012-02-26 LAB — PRO B NATRIURETIC PEPTIDE: B-Type Natriuretic Peptide: 2442 pg/mL — ABNORMAL HIGH (ref 0–450)

## 2012-02-26 LAB — CBC
HGB: 12.8 g/dL (ref 12.0–16.0)
MCH: 26.4 pg (ref 26.0–34.0)
MCV: 82 fL (ref 80–100)
Platelet: 239 10*3/uL (ref 150–440)

## 2012-02-26 LAB — CK TOTAL AND CKMB (NOT AT ARMC): CK, Total: 47 U/L (ref 21–215)

## 2012-02-26 LAB — URINALYSIS, COMPLETE
Bilirubin,UR: NEGATIVE
Blood: NEGATIVE
Glucose,UR: NEGATIVE mg/dL (ref 0–75)
Ketone: NEGATIVE
Nitrite: NEGATIVE
Ph: 7 (ref 4.5–8.0)
Protein: 100
RBC,UR: 2 /HPF (ref 0–5)
Squamous Epithelial: <1

## 2012-02-26 LAB — RAPID INFLUENZA A&B ANTIGENS

## 2012-02-27 DIAGNOSIS — I369 Nonrheumatic tricuspid valve disorder, unspecified: Secondary | ICD-10-CM

## 2012-02-27 LAB — CBC WITH DIFFERENTIAL/PLATELET
Basophil %: 0.1 %
Eosinophil #: 0 10*3/uL (ref 0.0–0.7)
Lymphocyte %: 7.7 %
MCV: 81 fL (ref 80–100)
Monocyte #: 0.2 x10 3/mm (ref 0.2–0.9)
Monocyte %: 2.2 %
Neutrophil #: 9.2 10*3/uL — ABNORMAL HIGH (ref 1.4–6.5)
Platelet: 239 10*3/uL (ref 150–440)
WBC: 10.2 10*3/uL (ref 3.6–11.0)

## 2012-02-27 LAB — BASIC METABOLIC PANEL
Anion Gap: 8 (ref 7–16)
BUN: 28 mg/dL — ABNORMAL HIGH (ref 7–18)
Calcium, Total: 9.4 mg/dL (ref 8.5–10.1)
EGFR (Non-African Amer.): 41 — ABNORMAL LOW
Glucose: 213 mg/dL — ABNORMAL HIGH (ref 65–99)
Osmolality: 291 (ref 275–301)
Potassium: 4.2 mmol/L (ref 3.5–5.1)
Sodium: 140 mmol/L (ref 136–145)

## 2012-02-27 LAB — TROPONIN I: Troponin-I: <0.02 ng/mL

## 2012-02-27 LAB — LIPID PANEL
Cholesterol: 169 mg/dL (ref 0–200)
Ldl Cholesterol, Calc: 103 mg/dL — ABNORMAL HIGH (ref 0–100)
Triglycerides: 140 mg/dL (ref 0–200)
VLDL Cholesterol, Calc: 28 mg/dL (ref 5–40)

## 2012-02-29 LAB — BASIC METABOLIC PANEL
Calcium, Total: 9.2 mg/dL (ref 8.5–10.1)
Chloride: 96 mmol/L — ABNORMAL LOW (ref 98–107)
Co2: 29 mmol/L (ref 21–32)
Creatinine: 1.49 mg/dL — ABNORMAL HIGH (ref 0.60–1.30)
EGFR (African American): 39 — ABNORMAL LOW
EGFR (Non-African Amer.): 33 — ABNORMAL LOW
Osmolality: 294 (ref 275–301)
Potassium: 4.3 mmol/L (ref 3.5–5.1)

## 2012-03-02 LAB — BASIC METABOLIC PANEL
Chloride: 96 mmol/L — ABNORMAL LOW (ref 98–107)
Co2: 34 mmol/L — ABNORMAL HIGH (ref 21–32)
EGFR (Non-African Amer.): 34 — ABNORMAL LOW
Osmolality: 297 (ref 275–301)
Potassium: 4.2 mmol/L (ref 3.5–5.1)

## 2012-03-03 LAB — BASIC METABOLIC PANEL
Anion Gap: 7 (ref 7–16)
Chloride: 98 mmol/L (ref 98–107)
Co2: 34 mmol/L — ABNORMAL HIGH (ref 21–32)
Creatinine: 1.32 mg/dL — ABNORMAL HIGH (ref 0.60–1.30)
EGFR (African American): 45 — ABNORMAL LOW
Glucose: 66 mg/dL (ref 65–99)
Osmolality: 296 (ref 275–301)
Potassium: 3.9 mmol/L (ref 3.5–5.1)
Sodium: 139 mmol/L (ref 136–145)

## 2012-03-03 LAB — CULTURE, BLOOD (SINGLE)

## 2012-03-04 LAB — BASIC METABOLIC PANEL
Anion Gap: 7 (ref 7–16)
Calcium, Total: 9.3 mg/dL (ref 8.5–10.1)
Chloride: 101 mmol/L (ref 98–107)
Co2: 32 mmol/L (ref 21–32)
EGFR (African American): 56 — ABNORMAL LOW
EGFR (Non-African Amer.): 49 — ABNORMAL LOW
Glucose: 63 mg/dL — ABNORMAL LOW (ref 65–99)
Osmolality: 295 (ref 275–301)
Potassium: 4 mmol/L (ref 3.5–5.1)

## 2012-03-05 ENCOUNTER — Telehealth: Payer: Self-pay

## 2012-03-05 NOTE — Telephone Encounter (Signed)
Burna Mortimer, niece called;pt in hospital at [facility] since 02/26/12 with diagnosis of asthma and bronchitis. Pt is very confused and cannot remember family members; did CT of brain 03/04/12 and that showed sinus infection. Pt is on morphine and oxycodone. Burna Mortimer has not talked with doctor yet. Burna Mortimer wants to know what can be done about confusion and forgetfulness; pt is doing better re; to asthma and bronchitis. Mammie Lorenzo she needs to talk with doctor who has pt in hospital. NO DPR form signed.

## 2012-03-05 NOTE — Telephone Encounter (Signed)
Agreed, needs to come through the hospital MD.

## 2012-03-19 ENCOUNTER — Encounter: Payer: Self-pay | Admitting: Family Medicine

## 2012-03-19 ENCOUNTER — Ambulatory Visit (INDEPENDENT_AMBULATORY_CARE_PROVIDER_SITE_OTHER): Payer: Medicare Other | Admitting: Family Medicine

## 2012-03-19 VITALS — BP 124/60 | HR 79 | Temp 97.7°F | Wt 207.8 lb

## 2012-03-19 DIAGNOSIS — M353 Polymyalgia rheumatica: Secondary | ICD-10-CM

## 2012-03-19 DIAGNOSIS — J441 Chronic obstructive pulmonary disease with (acute) exacerbation: Secondary | ICD-10-CM

## 2012-03-19 DIAGNOSIS — F29 Unspecified psychosis not due to a substance or known physiological condition: Secondary | ICD-10-CM

## 2012-03-19 DIAGNOSIS — I509 Heart failure, unspecified: Secondary | ICD-10-CM

## 2012-03-19 DIAGNOSIS — R410 Disorientation, unspecified: Secondary | ICD-10-CM

## 2012-03-19 DIAGNOSIS — E119 Type 2 diabetes mellitus without complications: Secondary | ICD-10-CM

## 2012-03-19 NOTE — Patient Instructions (Signed)
See Shirlee Limerick about your referral before you leave today. If you sugar is elevated in the meantime, then notify me.  We'll be in touch after I get the CT results.

## 2012-03-21 DIAGNOSIS — R410 Disorientation, unspecified: Secondary | ICD-10-CM | POA: Insufficient documentation

## 2012-03-21 NOTE — Assessment & Plan Note (Signed)
She'll continue her current sliding scale and we'll refer to endo.  She agrees.

## 2012-03-21 NOTE — Assessment & Plan Note (Signed)
Episodic, but noted over months now.  I'll d/w Dr. Lisabeth Pick about cymbalta for depressive sx and for pain, then we'll be in touch with patient.  She agrees.  >25 min spent with face to face with patient, >50% counseling and/or coordinating care

## 2012-03-21 NOTE — Progress Notes (Signed)
Hospital f/u.    Admitted with confusion.  Head Ct neg for acute changes.  This could have been exacerbated by pain, pain meds, acute illness, depression.  She will have some days where her thoughts are clear.  Some days she'll have confusion about time of day, time of appointments.  She keeps her meds organized in a pill box to prevent errors.  She has no tremor or new focal neuro sx.  Hospital records reviewed.    She has chronic pain and is followed by Dr. Lisabeth Pick.  She has mult social factors, family and her debility, that have affected her mood.  She has no SI but is depressed and tearful.  We discussed her prev med intolerances.  DM2- last A1c here was 6.2.  Her insulin was decreased significantly but her sugar readings have been fairly well controlled since coming home from [facility].  It was suggested that she have endo f/u and we discussed this.    Meds, vitals, and allergies reviewed.   ROS: See HPI.  Otherwise, noncontributory.  nad but tearful Mmm rrr ctab abd soft, +BS In w/c

## 2012-03-22 ENCOUNTER — Other Ambulatory Visit: Payer: Self-pay | Admitting: Family Medicine

## 2012-03-22 ENCOUNTER — Telehealth: Payer: Self-pay | Admitting: Family Medicine

## 2012-03-22 MED ORDER — INSULIN GLARGINE 100 UNIT/ML ~~LOC~~ SOLN: 16.0000 [IU] | Freq: Every day | SUBCUTANEOUS | Status: DC

## 2012-03-22 MED ORDER — INSULIN LISPRO 100 UNIT/ML ~~LOC~~ SOLN: SUBCUTANEOUS | Status: DC

## 2012-03-22 NOTE — Telephone Encounter (Signed)
Sent!

## 2012-03-22 NOTE — Telephone Encounter (Signed)
Electronic refill request.  I think this dosage has changed.  Please advise.

## 2012-03-22 NOTE — Telephone Encounter (Signed)
Call pt.  I talked with Dr. Lisabeth Pick.  He agrees with the trial of cymbalta.  Please either send rx to pharmacy (30mg , 1 a day, #30, 1rf) or give her 6 weeks of samples from the clinic (same sig).  Please add to med list.  I would like phone update from patient about 1 week after starting the medicine.  Thanks.

## 2012-03-22 NOTE — Telephone Encounter (Signed)
Patient advised.  Samples left at front desk for pick up.  Patient advised to call with update in a week.

## 2012-03-25 ENCOUNTER — Ambulatory Visit: Payer: Medicare Other | Admitting: Internal Medicine

## 2012-03-31 ENCOUNTER — Ambulatory Visit (INDEPENDENT_AMBULATORY_CARE_PROVIDER_SITE_OTHER): Payer: Medicare Other | Admitting: Internal Medicine

## 2012-03-31 ENCOUNTER — Encounter: Payer: Self-pay | Admitting: Internal Medicine

## 2012-03-31 VITALS — BP 116/80 | HR 78 | Ht 64.0 in | Wt 210.0 lb

## 2012-03-31 DIAGNOSIS — E162 Hypoglycemia, unspecified: Secondary | ICD-10-CM

## 2012-03-31 DIAGNOSIS — E118 Type 2 diabetes mellitus with unspecified complications: Secondary | ICD-10-CM

## 2012-03-31 MED ORDER — GLUCAGON (RDNA) 1 MG IJ KIT: 1.0000 mg | PACK | Freq: Once | INTRAMUSCULAR | Status: DC | PRN

## 2012-03-31 NOTE — Patient Instructions (Addendum)
Please return in 2 months with your sugar log.  GREAT JOB IN KEEPING THE LOG!!!!!  Please decrease the Lantus to 14 units at night. Please increase the Humalog to 3 units for a smaller meal, and use 4 units for a larger meal.  Please call me if your sugars stay high (around 200's)or low (around 80's)  Please pick up your glucagon kit from the pharmacy.  Please go to the lab at 8 am for a cortisol level. They should fax the results at [phone] (attn Dr. Elvera Lennox). Please do not take the Anusol suppository or anything with corticosteroids (hydrocortisone, prednisone, dexamethasone, etc.) at least 48h before the lab draw.

## 2012-03-31 NOTE — Progress Notes (Signed)
Subjective:     Patient ID: [name], female   DOB: 06/30/1931, 79 y.o.   MRN: [id number]  HPI [name] is a very pleasant 79 y/o woman, referred by PCP, DR. Para March, for management of DM2,insulin-dependent, controlled, with complications (CKD stage 4 - last GFR 40, peripheral neuropathy, PVD), in the context of continuous decreasing insulin requirement and h/o severe hypoglycemia episodes. She is here accompanied by her niece.   She was dx with DM2 in 1998. She has been on Insulin since 1991. She uses the Lantus but because of arthritis, she had to switch from the pen to the syringes.   Last HbA1C: Lab Results  Component Value Date   HGBA1C 6.2 11/10/2011  Previous HbA1C was 7.2%, but before this, 5.3-6%.  She brings a very detailed, exemplary-kept, CBG log, in which she documents sugars checked at least 6x a day: before and 2h after each meal. The sugars before meals are invariably 85-130, mostly 100-120, while the ones after meals are higher, 140-220, mostly ~180. She also documents how much insulin she injects: - Lantus 16 units qhs - Humalog 0 or 2 units, only injecting it for a meal if pre-meal sugars >120, and this happens approx. 1-rarely 2x a day. There are no lows in her log, but she did drop her sugars repeatedly in the past, even to 12! As per review of Epic records.   Reviewing her log, in 10/2011, she was on: - Lantus 70 units qhs - Humalog 24 units tid ac With essentially the same CBG values as now. She mentions a lack of appetite and her weight decreased 236 >> 217 >> 197 (during last hospitalization this month 01/02-01/2013 for confusion, acute resp. failure, acute diastolic CHF) >> 469 (now). However, her weight can fluctuate by up to 5 lbs in a day ~ fluid status. She is on a large Lasix dose.   She has a h/o thyroidectomy and is on replacement. Her last TSH was normal: Lab Results  Component Value Date   TSH 2.57 11/10/2011  She is not on medicines that can cause  weight loss or hypoglycemia (other than insulin). He last albumin was 3.1, and this is chronically low for her. B\her CBC with diff shows mild anemia, Hb 11.9, with an increased RDW. Last lipids (07/2011): 102/215/33/48. Her kidney function is low, but not drastically changing:  Last Cr from OSH hospitalization: BUN/Cr: 64/1.42 at adm. >> 61/1.19 at d/c. The BUN has increased compared to previous values: BUN  Date Value Range Status  07/28/2011 25* 6 - 23 mg/dL Final  62/95/2841 28* 6 - 23 mg/dL Final  05/17/4008 20  6 - 23 mg/dL Final  2/72/5366 22  6 - 23 mg/dL Final  4/40/3474 23  6 - 23 mg/dL Final  2/59/5638 19  6 - 23 mg/dL Final   Creatinine, Ser  Date Value Range Status  07/28/2011 1.3* 0.4 - 1.2 mg/dL Final  75/64/3329 5.18* 0.50 - 1.10 mg/dL Final  8/41/6606 3.01* 0.50 - 1.10 mg/dL Final  07/26/930 3.55* 0.50 - 1.10 mg/dL Final  7/32/2025 4.27* 0.50 - 1.10 mg/dL Final  0/62/3762 8.31* 0.50 - 1.10 mg/dL Final     ICTERUS AT THIS LEVEL MAY AFFECT RESULT   Diet: - b'fast: cream of wheat, cheerios, fruit - lunch: 1/2 sandwich/fruit - dinner: hamburgers, bacon+eggs, shrimp, fruit - snacks: 2/day  She does have an extensive PMH, which I reviewed today.   She sees her ophthalmologist yearly. Has CKD. She has numbness  and tingling in hands and feet.   She has Jill in her family (mother). Her nephew died at 25 y/o from Addisons Ds.   Review of Systems Constitutional: + weight loss then gain (see HPI), + fatigue, + subjective hypothermia; excessive urination (has neurogenic bladder), nocturia; poor sleep Eyes: + blurry vision, + xerophthalmia ENT: no sore throat, no nodules palpated in throat, no dysphagia/odynophagia, no hoarseness Cardiovascular: + CP (only when coughing)/+ SOB/+ palpitations/+ leg + arm swelling Respiratory: + cough/+ SOB Gastrointestinal: + N/heartburn, no V/D/C Musculoskeletal: + muscle/+ joint aches Skin: no rashes Neurological: no tremors/+  numbness/+tingling - legs/no dizziness Psychiatric: no depression/anxiety  Past Medical History  Diagnosis Date  . Asthma   . COPD (chronic obstructive pulmonary disease)   . Diabetes mellitus   . Hypertension   . Thyroid disease   . Hyperlipidemia 10.01.1996  . Atrial fibrillation [id number]  . Subdural hematoma   . Carotid disease, bilateral   . Abdominal hernia   . Colon polyps   . Chronic kidney disease     CKD with baseline Cr 2.7 as of 2/12; right renal mass felt to be benign followed by uro for 3 years w/o sig change, prev bx nondiagnostic  . Obesity   . Chronic pain syndrome   . Neurogenic bladder disorder   . History of endometriosis   . Polymyalgia rheumatica   . History of gastric ulcer   . GERD (gastroesophageal reflux disease)   . Migraines   . H/O: rheumatic fever   . H. pylori infection     as per 02/04/1994 Hospital Discharge summary  . Passive-dependent personality disorder   . Spinal cord injury   . Renal mass     right, assumed benign  . Gastric polyp   . Fatty liver 2002  . Small bowel obstruction   . Carotid artery disease   . Choledocholithiasis   . Anemia     Past Surgical History  Procedure Date  . Appendectomy 02/25/1948  . Laparoscopic ovarian cystectomy 02/25/1948  . Partial hysterectomy 1960  . Total abdominal hysterectomy 1964  . Laminectomy 1964 &1965  . Cholecystectomy 1973  . Carpal tunnel release 1974    left  . Spinal fusion 1982, 1983, 1985  . Ovarian cyst removal 1990  . Abdominal hernia repair 1994, 1996  . Abdominal wound dehiscence 1994  . Dobutamine stress echo 06/01/1997  . Bladder suspension 1950, 1982  . Goiter removal 1959  . Kidney cystectomy 2010  . Dilation and curettage of uterus   . Craniotomy 03/12/2003    with clot evacuation  . Ercp 11/12/2010    with stone removal and stent placement, stone and stent removed 12/25/10   History   Social History  . Marital Status: Divorced    Spouse Name: N/A     Number of Children: 2  . Years of Education: N/A   Occupational History  . Retired     [employer]   Social History Main Topics  . Smoking status: Former Smoker    Quit date: 02/25/2004  . Smokeless tobacco: Never Used  . Alcohol Use: No  . Drug Use: No  . Sexually Active: Not on file   Other Topics Concern  . Not on file   Social History Narrative   0 caffeine drinks daily    Current Outpatient Prescriptions on File Prior to Visit  Medication Sig Dispense Refill  . ATROVENT HFA 17 MCG/ACT inhaler INHALE 2 PUFFS 2 TIMES A DAY  3 Inhaler  3  . budesonide-formoterol (SYMBICORT) 160-4.5 MCG/ACT inhaler Inhale 2 puffs into the lungs 2 (two) times daily.  3 Inhaler  3  . calcitRIOL (ROCALTROL) 0.5 MCG capsule TAKE 1 CAPSULE BY MOUTH DAILY  90 capsule  3  . diazepam (VALIUM) 5 MG tablet Take 5 mg by mouth every 6 (six) hours as needed.        . digoxin (LANOXIN) 0.125 MG tablet Take 1 tablet (125 mcg total) by mouth daily.  15 tablet  0  . diphenhydrAMINE (BENADRYL) 50 MG tablet Take 50 mg by mouth at bedtime as needed.        . DULoxetine (CYMBALTA) 30 MG capsule Take 30 mg by mouth daily.      . furosemide (LASIX) 80 MG tablet Take 80 mg by mouth 2 (two) times daily.      Marland Kitchen glycerin adult (GLYCERIN ADULT) 2 G SUPP Place 1 suppository rectally once as needed.      . hydrocortisone (ANUSOL-HC) 25 MG suppository Place 25 mg rectally 2 (two) times daily as needed.      . insulin glargine (LANTUS) 100 UNIT/ML injection Inject 16 Units into the skin daily.  30 mL  3  . insulin lispro (HUMALOG) 100 UNIT/ML injection Inject per sliding scale, 3 times a day. 150-199- 2 units. 200-249-4 units. 250-299-6 units. 300+ 8 units  30 mL  3  . Insulin Syringe-Needle U-100 (INSULIN SYRINGE 1CC/30GX1/2") 30G X 1/2" 1 ML MISC Use as directed for insulin injections.  Dx:  250.00.      Marland Kitchen lansoprazole (PREVACID) 30 MG capsule Take 30 mg by mouth daily.      . metoprolol succinate (TOPROL-XL) 100 MG 24 hr  tablet TAKE 1 TABLET BY MOUTH DAILY  90 tablet  3  . Mirabegron ER (MYRBETRIQ) 50 MG TB24 Take 1 tablet by mouth daily.      Marland Kitchen morphine (MSIR) 15 MG tablet 2 times daily as needed      . omeprazole (PRILOSEC) 20 MG capsule TAKE 1 CAPSULE BY MOUTH 45 MINUTES PRIOR TO BREAKFAST  90 capsule  2  . ondansetron (ZOFRAN) 4 MG tablet Take 1 tablet (4 mg total) by mouth every 8 (eight) hours as needed for nausea.  30 tablet  5  . polyethylene glycol powder (MIRALAX) powder Take 17 g by mouth 2 (two) times daily.  255 g    . predniSONE (DELTASONE) 10 MG tablet Take 10 mg by mouth daily.      . promethazine (PHENERGAN) 25 MG tablet Take 1 tablet (25 mg total) by mouth every 6 (six) hours as needed for nausea.  30 tablet  5  . rosuvastatin (CRESTOR) 40 MG tablet Take 1 tablet (40 mg total) by mouth daily. At bedtime  90 tablet  3  . SYNTHROID 112 MCG tablet TAKE 1 TABLET DAILY  90 tablet  2  . glucagon 1 MG injection Inject 1 mg into the vein once as needed. Or in the muscle  1 each  11   Allergies  Allergen Reactions  . Alprazolam     REACTION: HIVES  . Amitriptyline Hcl     REACTION: HIVES  . Aspirin     REACTION: VOMITING  . Baclofen     REACTION: SLURRED SPEECH/ DISORIENTED  . Celecoxib     REACTION: GI UPSET  . Clonazepam     REACTION: HIVES  . Codeine     REACTION: ANAPHYLAXIS  . Diltiazem Hcl     REACTION: HIVES  .  Doxepin Hcl     REACTION: HIVES  . Fluoxetine Hcl     REACTION: MYALGIA  . Gabapentin     REACTION: SLURRED SPEECH  . Hydrocodone-Acetaminophen     REACTION: HIVES  . Levofloxacin     REACTION: itching/ hives  . Lisinopril     REACTION: stopped because of kidney failure  . Methadone     REACTION: HIVES  . Other     Spiriva  . Pentazocine Lactate     REACTION: UNSPECIFIED  . Phenobarbital     REACTION: MAKES HER HYPER  . Rofecoxib     REACTION: VOMITING  . Warfarin Sodium     REACTION: ANAPHYLAXIS  . Zolpidem Tartrate     REACTION: SLURRED SPEECH   Family  History  Problem Relation Age of Onset  . Atrial fibrillation Mother   . Colon cancer Mother   . Heart disease Father   . Heart attack Father   . Diabetes Sister   . Hypertension Sister   . Obesity Brother   . Hypertension Brother   . Hypertension Other   . Diabetes Father   . Stroke Other     Female 1st degree relative <50   Objective:   Physical Exam BP 116/80  Pulse 78  Ht 5\' 4"  (1.626 m)  Wt 210 lb (95.255 kg)  BMI 36.05 kg/m2  SpO2 97% Wt Readings from Last 3 Encounters:  03/31/12 210 lb (95.255 kg)  03/19/12 207 lb 12 oz (94.235 kg)  11/10/11 215 lb 0.1 oz (97.526 kg)   Constitutional: overweight, in NAD, in wheelchair Eyes: PERRLA, EOMI, no exophthalmos ENT: very dry mucous membranes, no thyromegaly, no cervical lymphadenopathy Cardiovascular: irregularly irregular rhythm, No MRG, arms and leg edema, pitting - pain at palpation of lower legs Respiratory: CTA B Gastrointestinal: abdomen soft, NT, ND, BS+ Musculoskeletal: no deformities, strength intact in all 4 Skin: moist, warm, no rashes Neurological: no tremor with outstretched hands, DTR normal in all 4  Assessment:     1. DM2,insulin-dependent, controlled, with complications - CKD stage 4 - peripheral neuropathy - PVD    Plan:     1. Pt has DM2 with decreasing insulin requirements in the absence of major weight loss (she has started to gain some of the weight she lost during the last hospitalization back), however, with deteriorating renal function (high BUN, stable Creatinine).  She does have PMR, and a family h/o Addison ds., which can cause decreasing insulin requirements due to lack of cortisol. Therefore, there are at least 2 possible causes for her decreased insulin requirements: decreased renal insulin clearance and adrenal insufficiency. - To rule out adrenal insufficiency, we will check a cortisol level at 8 am and, if low, will do a Cosyntropin stimulation test. She has been on corticosteroids for  COPD in the past, including this last hospitalization, and is on Anusol, which I advised her to hold for at least 48h before the test.  - for now, since she drops her sugars over night by >50%, I will decrease her Lantus further, to 14 units. - reciprocally, I advised her to inject the mealtime insulin with every meal, and increase to 3 units for a smaller meal (and occasionally to 4 units for a larger meal) since her only high sugars are 2h postmeal.  - she will let me know about highs or lows on this new regimen - advised her that she can keep the insulin out of the fridge after opening a bottle - given  foot care handout and explained the principles - given instructions for hypoglycemia management "15-15 rule" - I sent a glucagon kit to her pharmacy - we discussed about carb counting, which she does not do but is interested in learning. I offered to send her to DM education for teaching, but she would like to hold off for now and see how the new regimen will work. - I advised her to join MyChart - I will see her back in 2 months with her beautifully-kept CBG log

## 2012-04-01 ENCOUNTER — Other Ambulatory Visit: Payer: Self-pay | Admitting: *Deleted

## 2012-04-01 ENCOUNTER — Telehealth: Payer: Self-pay | Admitting: Internal Medicine

## 2012-04-01 DIAGNOSIS — E118 Type 2 diabetes mellitus with unspecified complications: Secondary | ICD-10-CM

## 2012-04-01 MED ORDER — GLUCAGON (RDNA) 1 MG IJ KIT: 1.0000 mg | PACK | Freq: Once | INTRAMUSCULAR | Status: DC | PRN

## 2012-04-01 NOTE — Telephone Encounter (Signed)
PATIENT MEDICATION CALLED TO HER [facility]. PATIENT NOTIFIED OF THIS.

## 2012-05-03 ENCOUNTER — Encounter: Payer: Self-pay | Admitting: Family Medicine

## 2012-05-06 ENCOUNTER — Telehealth: Payer: Self-pay

## 2012-05-06 MED ORDER — DULOXETINE HCL 30 MG PO CPEP: 30.0000 mg | ORAL_CAPSULE | Freq: Every day | ORAL | Status: DC

## 2012-05-06 NOTE — Telephone Encounter (Signed)
Pt request Cymbalta sent to [facility] (pt request [facility] deliver med). Pt had forgotten to call back with response to med per phone note 03/22/12. Pt said she is amazed how well she is doing; pt is not crying or worrying and is sleeping better. Pt is out of med.Please advise.

## 2012-05-06 NOTE — Telephone Encounter (Signed)
Patient advised.  She will ask her niece to come by to pick up samples.  Samples left at front desk.

## 2012-05-06 NOTE — Telephone Encounter (Signed)
rx sent.  We have some samples, please give to patient.  Thanks.

## 2012-05-10 ENCOUNTER — Ambulatory Visit: Payer: Medicare Other | Admitting: Family Medicine

## 2012-05-13 ENCOUNTER — Encounter (INDEPENDENT_AMBULATORY_CARE_PROVIDER_SITE_OTHER): Payer: Medicare Other

## 2012-05-13 ENCOUNTER — Telehealth: Payer: Self-pay

## 2012-05-13 NOTE — Telephone Encounter (Signed)
Completed PA form faxed to [phone].

## 2012-05-13 NOTE — Telephone Encounter (Signed)
[facility] faxed prior auth for Duloxetine. PA form on Dr Lianne Bushy desk.

## 2012-05-18 ENCOUNTER — Ambulatory Visit (INDEPENDENT_AMBULATORY_CARE_PROVIDER_SITE_OTHER): Payer: Medicare Other | Admitting: Family Medicine

## 2012-05-18 ENCOUNTER — Encounter: Payer: Self-pay | Admitting: Family Medicine

## 2012-05-18 VITALS — BP 122/60 | HR 65 | Temp 98.0°F

## 2012-05-18 DIAGNOSIS — R4182 Altered mental status, unspecified: Secondary | ICD-10-CM

## 2012-05-18 DIAGNOSIS — F29 Unspecified psychosis not due to a substance or known physiological condition: Secondary | ICD-10-CM

## 2012-05-18 DIAGNOSIS — R410 Disorientation, unspecified: Secondary | ICD-10-CM

## 2012-05-18 NOTE — Patient Instructions (Addendum)
Keep the next endocrine appointment in [place] but ask them about getting the appointment after that here.  Take care.  Don't change your meds.  Plan on getting together again in 6 months.  Glad to see you.

## 2012-05-19 NOTE — Progress Notes (Signed)
Mood is improved on cymbalta, but pain was minimally improved.  She's happy about the mood changes.  Her thoughts are clearer and her mood is brighter.  She is looking forward to her grandkids coming to town in a few weeks.  She is crying less.   She has had f/u with Dr. Lisabeth Pick about her pain and will have f/u with him again soon.  Also seening Dr. Achilles Dunk with uro soon.    Her BMs are regular with the current meds.  "I don't like taking them, but they keep me out of the hospital."  She has had f/u with endo and will f/u again soon. Her sugars have been controlled on lower doses of insulin.    Meds, vitals, and allergies reviewed.   ROS: See HPI.  Otherwise, noncontributory.  nad overwieght In wheelchair at baseline Speech fluent Judgement intact rrr ctab

## 2012-05-19 NOTE — Assessment & Plan Note (Signed)
Much improved with addition of cymbalta.  This may have been depression related.  Continue as is.  I wouldn't up the dose of cymbalta at this point and she agrees.  Recheck in 6months.  She'll see other MDs in the interval. App help of all caring for this pleasant patient.

## 2012-05-20 NOTE — Telephone Encounter (Signed)
Spoke with Burna Mortimer at [phone] and PA should go to defense acct. Called [phone] spoke with Marcy Salvo; approved over phone; case # [id number], approved 04/19/12 thru indefinitely. [facility] notified and rx went thru.

## 2012-05-26 ENCOUNTER — Ambulatory Visit: Payer: Medicare Other | Admitting: Internal Medicine

## 2012-06-18 ENCOUNTER — Ambulatory Visit: Payer: Medicare Other | Admitting: Cardiovascular Disease

## 2012-06-22 ENCOUNTER — Encounter: Payer: Self-pay | Admitting: Cardiovascular Disease

## 2012-06-22 ENCOUNTER — Ambulatory Visit (INDEPENDENT_AMBULATORY_CARE_PROVIDER_SITE_OTHER): Payer: Medicare Other | Admitting: Cardiovascular Disease

## 2012-06-22 ENCOUNTER — Encounter: Payer: Self-pay | Admitting: *Deleted

## 2012-06-22 VITALS — BP 171/73 | HR 58 | Ht 64.0 in | Wt 206.5 lb

## 2012-06-22 DIAGNOSIS — I739 Peripheral vascular disease, unspecified: Secondary | ICD-10-CM

## 2012-06-22 DIAGNOSIS — N184 Chronic kidney disease, stage 4 (severe): Secondary | ICD-10-CM

## 2012-06-22 DIAGNOSIS — E119 Type 2 diabetes mellitus without complications: Secondary | ICD-10-CM

## 2012-06-22 DIAGNOSIS — E785 Hyperlipidemia, unspecified: Secondary | ICD-10-CM

## 2012-06-22 DIAGNOSIS — I10 Essential (primary) hypertension: Secondary | ICD-10-CM

## 2012-06-22 DIAGNOSIS — I4891 Unspecified atrial fibrillation: Secondary | ICD-10-CM

## 2012-06-22 NOTE — Assessment & Plan Note (Addendum)
Chronic atrial fibrillation. Rate well controlled.

## 2012-06-22 NOTE — Assessment & Plan Note (Signed)
We have suggested she decrease her Lasix use given mouth is dry, skin is dry, no edema. We'll try to obtain her most recent renal function for our records. Concern about prerenal state.

## 2012-06-22 NOTE — Assessment & Plan Note (Signed)
40% bilateral carotid arterial disease. Continue aggressive cholesterol management

## 2012-06-22 NOTE — Assessment & Plan Note (Signed)
Cholesterol is at goal on the current lipid regimen. No changes to the medications were made.  

## 2012-06-22 NOTE — Patient Instructions (Addendum)
You are doing well. No medication changes were made.  Please monitor your blood pressure Call the office for continued high blood pressure  Please call us if you have new issues that need to be addressed before your next appt.  Your physician wants you to follow-up in: 6 months.  You will receive a reminder letter in the mail two months in advance. If you don't receive a letter, please call our office to schedule the follow-up appointment.

## 2012-06-22 NOTE — Assessment & Plan Note (Signed)
We have encouraged continued exercise, careful diet management in an effort to lose weight. 

## 2012-06-22 NOTE — Assessment & Plan Note (Signed)
Blood pressure is well controlled on today's visit. No changes made to the medications. 

## 2012-06-22 NOTE — Progress Notes (Signed)
Patient ID: [name], female    DOB: [birth year], 79 y.o.   MRN: [id number]  HPI Comments: [name]  is a 79 year old woman with chronic atrial fibrillation, long smoking history, history of subdural hematoma while on warfarin, chronic renal insufficiency on diuretics with history of spinal cord injury and neurogenic bladder, peripheral vascular disease with 40-59% left internal carotid arterial disease in 2008 who presents for routine followup. H/o GI stricture  Overall she is doing well.  Hemoglobin A1c is 6.2. Total cholesterol 102  Recently in the hospital January 2014 with asthma attack. Initially started on diuresis with worsening renal failure Creatinine improved back to baseline by the end of her admission   Stop smoking many years ago She has chronic back and leg pain. Blood pressure high today as she did not take her pain pill  She denies any significant chest pain.    Echocardiogram 02/27/2012 showing normal ejection fraction greater than 55%, moderately dilated left atrium, moderately elevated right ventricular systolic pressure 30-40 mm of mercury EKG shows atrial fibrillation with ventricular rate 58 beats per minute, no significant ST or T wave changes      Outpatient Encounter Prescriptions as of 06/22/2012  Medication Sig Dispense Refill  . ATROVENT HFA 17 MCG/ACT inhaler INHALE 2 PUFFS 2 TIMES A DAY  3 Inhaler  3  . budesonide-formoterol (SYMBICORT) 160-4.5 MCG/ACT inhaler Inhale 2 puffs into the lungs 2 (two) times daily.  3 Inhaler  3  . calcitRIOL (ROCALTROL) 0.5 MCG capsule TAKE 1 CAPSULE BY MOUTH DAILY  90 capsule  3  . diazepam (VALIUM) 5 MG tablet Take 5 mg by mouth every 6 (six) hours as needed.        . digoxin (LANOXIN) 0.125 MG tablet Take 1 tablet (125 mcg total) by mouth daily.  15 tablet  0  . diphenhydrAMINE (BENADRYL) 50 MG tablet Take 50 mg by mouth at bedtime as needed.        . DULoxetine (CYMBALTA) 30 MG capsule Take 1 capsule (30 mg total) by  mouth daily.  30 capsule  12  . furosemide (LASIX) 80 MG tablet Take 80 mg by mouth 2 (two) times daily as needed.       Marland Kitchen glycerin adult (GLYCERIN ADULT) 2 G SUPP Place 1 suppository rectally once as needed.      . hydrocortisone (ANUSOL-HC) 25 MG suppository Place 25 mg rectally 2 (two) times daily as needed.      . insulin glargine (LANTUS) 100 UNIT/ML injection Inject 12-16 Units into the skin as needed.      . insulin lispro (HUMALOG) 100 UNIT/ML injection Inject per sliding scale, 3 times a day. 150-199- 2 units. 200-249-4 units. 250-299-6 units. 300+ 8 units  30 mL  3  . Insulin Syringe-Needle U-100 (INSULIN SYRINGE 1CC/30GX1/2") 30G X 1/2" 1 ML MISC Use as directed for insulin injections.  Dx:  250.00.      Marland Kitchen lansoprazole (PREVACID) 30 MG capsule Take 30 mg by mouth daily.      . metoprolol succinate (TOPROL-XL) 100 MG 24 hr tablet TAKE 1 TABLET BY MOUTH DAILY  90 tablet  3  . Mirabegron ER (MYRBETRIQ) 50 MG TB24 Take 1 tablet by mouth daily.      Marland Kitchen morphine (MSIR) 15 MG tablet 2 times daily as needed      . omeprazole (PRILOSEC) 20 MG capsule TAKE 1 CAPSULE BY MOUTH 45 MINUTES PRIOR TO BREAKFAST  90 capsule  2  . ondansetron (  ZOFRAN) 4 MG tablet Take 1 tablet (4 mg total) by mouth every 8 (eight) hours as needed for nausea.  30 tablet  5  . polyethylene glycol powder (GLYCOLAX/MIRALAX) powder Take 17 g by mouth daily.      . promethazine (PHENERGAN) 25 MG tablet Take 1 tablet (25 mg total) by mouth every 6 (six) hours as needed for nausea.  30 tablet  5  . rosuvastatin (CRESTOR) 40 MG tablet Take 1 tablet (40 mg total) by mouth daily. At bedtime  90 tablet  3  . SYNTHROID 112 MCG tablet TAKE 1 TABLET DAILY  90 tablet  2  . [DISCONTINUED] insulin glargine (LANTUS) 100 UNIT/ML injection Inject 16 Units into the skin daily.  30 mL  3  . [DISCONTINUED] polyethylene glycol powder (MIRALAX) powder Take 17 g by mouth 2 (two) times daily.  255 g    . [DISCONTINUED] glucagon 1 MG injection Inject  1 mg into the muscle once as needed.  1 each  11   No facility-administered encounter medications on file as of 06/22/2012.    Review of Systems  Constitutional: Negative.   HENT: Negative.   Eyes: Negative.   Respiratory: Negative.   Cardiovascular: Negative.   Gastrointestinal: Negative.   Musculoskeletal: Positive for back pain and gait problem.  Skin: Negative.   Neurological: Negative.   Psychiatric/Behavioral: Negative.   All other systems reviewed and are negative.    BP 171/73  Pulse 58  Ht 5\' 4"  (1.626 m)  Wt 206 lb 8 oz (93.668 kg)  BMI 35.43 kg/m2  Physical Exam  Nursing note and vitals reviewed. Constitutional: She is oriented to person, place, and time. She appears well-developed and well-nourished.  Presents in a wheelchair. Obese.  HENT:  Head: Normocephalic.  Nose: Nose normal.  Mouth/Throat: Oropharynx is clear and moist.  Eyes: Conjunctivae are normal. Pupils are equal, round, and reactive to light.  Neck: Normal range of motion. Neck supple. No JVD present. Carotid bruit is present.  Cardiovascular: Normal rate, S1 normal, S2 normal and intact distal pulses.  An irregularly irregular rhythm present. Exam reveals no gallop and no friction rub.   Murmur heard.  Crescendo systolic murmur is present with a grade of 2/6  Trace bilateral leg edema  Pulmonary/Chest: Effort normal and breath sounds normal. No respiratory distress. She has no wheezes. She has no rales. She exhibits no tenderness.  Abdominal: Soft. Bowel sounds are normal. She exhibits no distension. There is no tenderness.  Musculoskeletal: Normal range of motion. She exhibits no edema and no tenderness.  Lymphadenopathy:    She has no cervical adenopathy.  Neurological: She is alert and oriented to person, place, and time. Coordination normal.  Skin: Skin is warm and dry. No rash noted. No erythema.  Psychiatric: She has a normal mood and affect. Her behavior is normal. Judgment and thought  content normal.    Assessment and Plan

## 2012-07-18 ENCOUNTER — Other Ambulatory Visit: Payer: Self-pay | Admitting: Family Medicine

## 2012-07-21 ENCOUNTER — Telehealth: Payer: Self-pay | Admitting: Internal Medicine

## 2012-07-21 NOTE — Telephone Encounter (Signed)
Patient c/o abdominal pain epigastric and nausea.  She has a history of choledochollitiasis in 2012.  She says the pain is consistent to her pain in 2012.  She denies fever, itching, or vomiting.  She is offered an appt for today with Mike Gip PA she says she doesn't have anyone to drive her here from [place] and wants to talk over the phone.  She is advised that she needs to evaluated in the office and will need labs and possible imaging that can't be done over the phone.  She doesn't want to come to the office.  I have advised her that she can go to the ER in [place] for evaluation.  She is encouraged to schedule an office visit for today or tomorrow, she again declines.  She will call me back if she changes her mind and wants to be seen.

## 2012-08-10 ENCOUNTER — Other Ambulatory Visit: Payer: Self-pay | Admitting: Family Medicine

## 2012-09-27 ENCOUNTER — Other Ambulatory Visit: Payer: Self-pay | Admitting: Family Medicine

## 2012-09-27 NOTE — Telephone Encounter (Signed)
Electronic refill request.  Please advise. 

## 2012-09-27 NOTE — Telephone Encounter (Signed)
Sent!

## 2012-10-21 ENCOUNTER — Other Ambulatory Visit: Payer: Self-pay | Admitting: Family Medicine

## 2012-10-21 NOTE — Telephone Encounter (Signed)
Electronic refill request. Does she continue this dose?  Please advise.

## 2012-10-21 NOTE — Telephone Encounter (Signed)
Please verify with pt.  I thought she was on 1 tab bid.  Thanks.

## 2012-11-23 ENCOUNTER — Ambulatory Visit: Payer: Medicare Other | Admitting: Family Medicine

## 2012-11-23 DIAGNOSIS — Z0289 Encounter for other administrative examinations: Secondary | ICD-10-CM

## 2012-12-10 ENCOUNTER — Other Ambulatory Visit: Payer: Self-pay | Admitting: Family Medicine

## 2012-12-10 NOTE — Telephone Encounter (Signed)
Electronic refill request.  Patient missed her last appt and has not rescheduled.  Please advise.

## 2012-12-12 NOTE — Telephone Encounter (Signed)
Both sent, would continue, try to schedule a visit. Thanks.

## 2012-12-13 NOTE — Telephone Encounter (Signed)
Letter mailed

## 2012-12-28 ENCOUNTER — Ambulatory Visit (INDEPENDENT_AMBULATORY_CARE_PROVIDER_SITE_OTHER): Payer: Medicare Other | Admitting: Family Medicine

## 2012-12-28 ENCOUNTER — Encounter: Payer: Self-pay | Admitting: Family Medicine

## 2012-12-28 VITALS — BP 130/50 | HR 92 | Temp 98.0°F | Wt 201.2 lb

## 2012-12-28 DIAGNOSIS — R413 Other amnesia: Secondary | ICD-10-CM

## 2012-12-28 DIAGNOSIS — R410 Disorientation, unspecified: Secondary | ICD-10-CM

## 2012-12-28 DIAGNOSIS — E119 Type 2 diabetes mellitus without complications: Secondary | ICD-10-CM

## 2012-12-28 DIAGNOSIS — F29 Unspecified psychosis not due to a substance or known physiological condition: Secondary | ICD-10-CM

## 2012-12-28 DIAGNOSIS — G894 Chronic pain syndrome: Secondary | ICD-10-CM

## 2012-12-28 LAB — COMPREHENSIVE METABOLIC PANEL
ALT: 12 U/L (ref 0–35)
AST: 16 U/L (ref 0–37)
Chloride: 93 mEq/L — ABNORMAL LOW (ref 96–112)
Creatinine, Ser: 1.2 mg/dL (ref 0.4–1.2)
Sodium: 135 mEq/L (ref 135–145)
Total Bilirubin: 0.4 mg/dL (ref 0.3–1.2)
Total Protein: 6.3 g/dL (ref 6.0–8.3)

## 2012-12-28 LAB — CBC WITH DIFFERENTIAL/PLATELET
Basophils Absolute: 0 10*3/uL (ref 0.0–0.1)
Eosinophils Absolute: 0.4 10*3/uL (ref 0.0–0.7)
HCT: 40 % (ref 36.0–46.0)
Hemoglobin: 13.4 g/dL (ref 12.0–15.0)
Lymphs Abs: 1.4 10*3/uL (ref 0.7–4.0)
MCHC: 33.5 g/dL (ref 30.0–36.0)
MCV: 85.6 fl (ref 78.0–100.0)
Monocytes Absolute: 0.8 10*3/uL (ref 0.1–1.0)
Neutro Abs: 8.5 10*3/uL — ABNORMAL HIGH (ref 1.4–7.7)
RDW: 15.5 % — ABNORMAL HIGH (ref 11.5–14.6)

## 2012-12-28 LAB — VITAMIN B12: Vitamin B-12: 184 pg/mL — ABNORMAL LOW (ref 211–911)

## 2012-12-28 LAB — FOLATE: Folate: 20.3 ng/mL (ref >5.9)

## 2012-12-28 NOTE — Patient Instructions (Signed)
Go to the lab on the way out.  We'll contact you with your lab report. I'll check with endocrine in the meantime.  Check your meds list and let me know if anything doesn't match up. Take care.

## 2012-12-28 NOTE — Progress Notes (Signed)
There is a big change in the patient.  She totally forgot her last appt which used to never happen.  She came in today babbling about something that didn't make sense.  Her niece has a list of issues that she is concerned about.  Patient was sitting in the exam room and saw the reflection of her own feet in the side of the exam table and asked whose shoes they were.  When I told her they were hers, she didn't believe me until I told her to move her feet so that she could see the movement. Ninetta Lights, CMA  The above is noted.    DM2 f/u- hasn't has f/u with endo recently.  Cortisol level still not drawn.  Dec in insulin requirement noted.  I have d/w endo- see notes on following labs.  Sugar has been controlled per patient report.  Still living at home, see above and below.   Memory changes.  Unclear source.  She had some confusion that improved prev with addition of cymbalta.  In meantime, her cymbalta and pain meds were adjusted by Dr. Lisabeth Pick.  Per patient and family, since then she has had more confusion.  I asked them to verify her med list today when they get home.  Pt and family agree.  She has needed more help with grocery list and had trouble operating a phone.  She is more forgetful and this is atypical.  Sister with h/o dementia.    Chronic pain- see above.   PMH and SH reviewed  ROS: See HPI, otherwise noncontributory.  Meds, vitals, and allergies reviewed.   Nad, alert, obese in wheelchair Speech fluent but slightly slower than prev, not slurred.  Mmm IRR, not tachy ctab abd soft Ext w/o edema

## 2012-12-29 ENCOUNTER — Encounter: Payer: Self-pay | Admitting: Family Medicine

## 2012-12-29 ENCOUNTER — Other Ambulatory Visit: Payer: Self-pay | Admitting: Family Medicine

## 2012-12-29 DIAGNOSIS — E538 Deficiency of other specified B group vitamins: Secondary | ICD-10-CM | POA: Insufficient documentation

## 2012-12-29 MED ORDER — CYANOCOBALAMIN 1000 MCG/ML IJ SOLN: 1000.0000 ug | INTRAMUSCULAR | Status: DC

## 2012-12-29 NOTE — Assessment & Plan Note (Signed)
See above re: pain meds. >25 min spent with face to face with patient, >50% counseling and/or coordinating care.

## 2012-12-29 NOTE — Assessment & Plan Note (Signed)
See notes on labs.  Could be med related, they'll verify meds and notify me.  This could be from nonacute CVA vs dementia and we'll need to consider imaging.  This doen't need to be done emergently or urgently.  No focal neuro changes today.  She does know the president, year, date.  Basic math wnl and 2/3 on recall.

## 2012-12-29 NOTE — Assessment & Plan Note (Signed)
See notes on labs.  Will ask for endo input re: cortisol when drawn.

## 2013-01-02 ENCOUNTER — Other Ambulatory Visit: Payer: Self-pay | Admitting: Family Medicine

## 2013-01-03 ENCOUNTER — Telehealth: Payer: Self-pay | Admitting: *Deleted

## 2013-01-03 NOTE — Telephone Encounter (Signed)
I spoke with Burna Mortimer (patient's niece who transports the patient and accompanies her on her OV's) to set up the lab appointment.  Burna Mortimer says that she and the patient's sister Gwyneth Revels) feel very strongly that the patient is not taking her medication appropriately.  She has the medications out in her weekly box but still goes in and takes meds from other days along with that day's meds.  Housekeepers say that they go in and clean the whole house with vacuum cleaners, etc and the patient never wakes up.  Both Burna Mortimer and Okey Dupre would be happy to make an appointment to come in and speak to you if you are willing.  They are very worried about her and the changes that have ensued.  They feel that she probably needs to be admitted to the hospital to get her medications regulated and do the CT scan.  Please advise.

## 2013-01-04 NOTE — Telephone Encounter (Signed)
I would get her in for eval on 01/04/13 at [facility] or have them take her to ER.  Either way, take all meds/bottles with them.

## 2013-01-04 NOTE — Telephone Encounter (Signed)
[name] advised.  They have made an appt for Thursday at 10:45 am.  [name] and the patient's sister [name]) will be coming with her.

## 2013-01-06 ENCOUNTER — Emergency Department (HOSPITAL_COMMUNITY)
Admission: EM | Admit: 2013-01-06 | Discharge: 2013-01-06 | Disposition: A | Discharge status: Home / Self Care | Payer: Medicare Other | Attending: Emergency Medicine | Admitting: Emergency Medicine

## 2013-01-06 ENCOUNTER — Emergency Department (HOSPITAL_COMMUNITY): Payer: Medicare Other

## 2013-01-06 ENCOUNTER — Ambulatory Visit (INDEPENDENT_AMBULATORY_CARE_PROVIDER_SITE_OTHER): Payer: Medicare Other | Admitting: Family Medicine

## 2013-01-06 ENCOUNTER — Encounter (HOSPITAL_COMMUNITY): Payer: Self-pay | Admitting: Emergency Medicine

## 2013-01-06 ENCOUNTER — Encounter: Payer: Self-pay | Admitting: Family Medicine

## 2013-01-06 VITALS — BP 104/60 | HR 68 | Temp 97.7°F | Wt 198.5 lb

## 2013-01-06 DIAGNOSIS — Z862 Personal history of diseases of the blood and blood-forming organs and certain disorders involving the immune mechanism: Secondary | ICD-10-CM | POA: Insufficient documentation

## 2013-01-06 DIAGNOSIS — G894 Chronic pain syndrome: Secondary | ICD-10-CM | POA: Insufficient documentation

## 2013-01-06 DIAGNOSIS — R413 Other amnesia: Secondary | ICD-10-CM | POA: Insufficient documentation

## 2013-01-06 DIAGNOSIS — N390 Urinary tract infection, site not specified: Secondary | ICD-10-CM

## 2013-01-06 DIAGNOSIS — Z79899 Other long term (current) drug therapy: Secondary | ICD-10-CM

## 2013-01-06 DIAGNOSIS — F329 Major depressive disorder, single episode, unspecified: Secondary | ICD-10-CM | POA: Insufficient documentation

## 2013-01-06 DIAGNOSIS — E669 Obesity, unspecified: Secondary | ICD-10-CM | POA: Insufficient documentation

## 2013-01-06 DIAGNOSIS — Z91199 Patient's noncompliance with other medical treatment and regimen due to unspecified reason: Secondary | ICD-10-CM | POA: Insufficient documentation

## 2013-01-06 DIAGNOSIS — R4182 Altered mental status, unspecified: Secondary | ICD-10-CM

## 2013-01-06 DIAGNOSIS — Z87828 Personal history of other (healed) physical injury and trauma: Secondary | ICD-10-CM | POA: Insufficient documentation

## 2013-01-06 DIAGNOSIS — Z8711 Personal history of peptic ulcer disease: Secondary | ICD-10-CM | POA: Insufficient documentation

## 2013-01-06 DIAGNOSIS — Z860100 Personal history of colon polyps, unspecified: Secondary | ICD-10-CM | POA: Insufficient documentation

## 2013-01-06 DIAGNOSIS — E079 Disorder of thyroid, unspecified: Secondary | ICD-10-CM | POA: Insufficient documentation

## 2013-01-06 DIAGNOSIS — G43909 Migraine, unspecified, not intractable, without status migrainosus: Secondary | ICD-10-CM | POA: Insufficient documentation

## 2013-01-06 DIAGNOSIS — E785 Hyperlipidemia, unspecified: Secondary | ICD-10-CM | POA: Insufficient documentation

## 2013-01-06 DIAGNOSIS — J4489 Other specified chronic obstructive pulmonary disease: Secondary | ICD-10-CM | POA: Insufficient documentation

## 2013-01-06 DIAGNOSIS — Z8739 Personal history of other diseases of the musculoskeletal system and connective tissue: Secondary | ICD-10-CM | POA: Insufficient documentation

## 2013-01-06 DIAGNOSIS — E119 Type 2 diabetes mellitus without complications: Secondary | ICD-10-CM | POA: Insufficient documentation

## 2013-01-06 DIAGNOSIS — Z8619 Personal history of other infectious and parasitic diseases: Secondary | ICD-10-CM | POA: Insufficient documentation

## 2013-01-06 DIAGNOSIS — Z87891 Personal history of nicotine dependence: Secondary | ICD-10-CM | POA: Insufficient documentation

## 2013-01-06 DIAGNOSIS — Z794 Long term (current) use of insulin: Secondary | ICD-10-CM | POA: Insufficient documentation

## 2013-01-06 DIAGNOSIS — Z9114 Patient's other noncompliance with medication regimen: Secondary | ICD-10-CM

## 2013-01-06 DIAGNOSIS — Z91148 Patient's other noncompliance with medication regimen for other reason: Secondary | ICD-10-CM

## 2013-01-06 DIAGNOSIS — F29 Unspecified psychosis not due to a substance or known physiological condition: Secondary | ICD-10-CM | POA: Insufficient documentation

## 2013-01-06 DIAGNOSIS — F3289 Other specified depressive episodes: Secondary | ICD-10-CM | POA: Insufficient documentation

## 2013-01-06 DIAGNOSIS — R3589 Other polyuria: Secondary | ICD-10-CM | POA: Insufficient documentation

## 2013-01-06 DIAGNOSIS — Z8742 Personal history of other diseases of the female genital tract: Secondary | ICD-10-CM | POA: Insufficient documentation

## 2013-01-06 DIAGNOSIS — R358 Other polyuria: Secondary | ICD-10-CM | POA: Insufficient documentation

## 2013-01-06 DIAGNOSIS — J449 Chronic obstructive pulmonary disease, unspecified: Secondary | ICD-10-CM | POA: Insufficient documentation

## 2013-01-06 DIAGNOSIS — I129 Hypertensive chronic kidney disease with stage 1 through stage 4 chronic kidney disease, or unspecified chronic kidney disease: Secondary | ICD-10-CM | POA: Insufficient documentation

## 2013-01-06 DIAGNOSIS — N189 Chronic kidney disease, unspecified: Secondary | ICD-10-CM | POA: Insufficient documentation

## 2013-01-06 DIAGNOSIS — Z9119 Patient's noncompliance with other medical treatment and regimen: Secondary | ICD-10-CM | POA: Insufficient documentation

## 2013-01-06 DIAGNOSIS — K219 Gastro-esophageal reflux disease without esophagitis: Secondary | ICD-10-CM | POA: Insufficient documentation

## 2013-01-06 DIAGNOSIS — I4891 Unspecified atrial fibrillation: Secondary | ICD-10-CM | POA: Insufficient documentation

## 2013-01-06 LAB — CBC WITH DIFFERENTIAL/PLATELET
Basophils Absolute: 0 10*3/uL (ref 0.0–0.1)
Basophils Relative: 0 % (ref 0–1)
Eosinophils Absolute: 0.4 10*3/uL (ref 0.0–0.7)
Eosinophils Relative: 3 % (ref 0–5)
HCT: 43.3 % (ref 36.0–46.0)
Lymphocytes Relative: 16 % (ref 12–46)
Lymphs Abs: 1.7 10*3/uL (ref 0.7–4.0)
MCH: 29.6 pg (ref 26.0–34.0)
MCHC: 33.9 g/dL (ref 30.0–36.0)
MCV: 87.3 fL (ref 78.0–100.0)
Monocytes Absolute: 0.9 10*3/uL (ref 0.1–1.0)
Neutro Abs: 7.6 10*3/uL (ref 1.7–7.7)
Neutrophils Relative %: 72 % (ref 43–77)
RBC: 4.96 MIL/uL (ref 3.87–5.11)
RDW: 14.8 % (ref 11.5–15.5)

## 2013-01-06 LAB — URINE MICROSCOPIC-ADD ON

## 2013-01-06 LAB — URINALYSIS, ROUTINE W REFLEX MICROSCOPIC
Glucose, UA: NEGATIVE mg/dL
Hgb urine dipstick: NEGATIVE
Protein, ur: 100 mg/dL — AB
Specific Gravity, Urine: 1.01 (ref 1.005–1.030)
pH: 7 (ref 5.0–8.0)

## 2013-01-06 LAB — COMPREHENSIVE METABOLIC PANEL
AST: 17 U/L (ref 0–37)
Albumin: 3.5 g/dL (ref 3.5–5.2)
BUN: 16 mg/dL (ref 6–23)
CO2: 30 mEq/L (ref 19–32)
Calcium: 9.8 mg/dL (ref 8.4–10.5)
Chloride: 96 mEq/L (ref 96–112)
Creatinine, Ser: 1.11 mg/dL — ABNORMAL HIGH (ref 0.50–1.10)
Total Bilirubin: 0.6 mg/dL (ref 0.3–1.2)
Total Protein: 7.4 g/dL (ref 6.0–8.3)

## 2013-01-06 MED ORDER — CEPHALEXIN 500 MG PO CAPS: 500.0000 mg | ORAL_CAPSULE | Freq: Three times a day (TID) | ORAL | Status: DC

## 2013-01-06 MED ORDER — CEPHALEXIN 250 MG PO CAPS
500.0000 mg | ORAL_CAPSULE | Freq: Once | ORAL | Status: AC
  Administered 2013-01-06: 500 mg via ORAL
  Filled 2013-01-06: qty 2

## 2013-01-06 MED ORDER — OXYCODONE HCL 10 MG PO TABS: 20.0000 mg | ORAL_TABLET | Freq: Four times a day (QID) | ORAL | Status: DC

## 2013-01-06 NOTE — ED Notes (Signed)
Pt sent here by her pcp for altered mental status - slow progression over several months.  Family states Dr Para March spoke with MD here and requested several tests.

## 2013-01-06 NOTE — ED Notes (Signed)
Visitor up at [facility] to inquire on wait.  She reports that PCP called with orders, but he informed nurse of impending visit.

## 2013-01-06 NOTE — ED Notes (Signed)
Family members back up to [facility].  One called Dr. Para March to inquire on orders.  I was handed her personal phone and was told by Dr. Para March that he wanted pt to be evaluated by EDP, that there were no orders from him.  Family upset at long wait, considering taking pt home due to pain.

## 2013-01-06 NOTE — ED Provider Notes (Signed)
CSN: [id number]     Arrival date & time 01/06/13  1156 History   First MD Initiated Contact with Patient 01/06/13 1544     Chief Complaint  Patient presents with  . Altered Mental Status   (Consider location/radiation/quality/duration/timing/severity/associated sxs/prior Treatment) Patient is a 79 y.o. female presenting with altered mental status.  Altered Mental Status  Level 5 caveat due to AMS Pt with numerous medical problems including chronic pain on a long list of medications which includes Morphine and Oxycodone brought to the ED from PCP office by sister. Pt has had several weeks of declining mental status with confusion, memory problems and concerns for polypharmacy as well as confusion over taking her medications properly. She reports continuation of her chronic pain but denies any acute symptoms. No CP, SOB, fever, N/V, she has polyuria 'for 30 years' but no pain or change in this pattern.   Past Medical History  Diagnosis Date  . Asthma   . COPD (chronic obstructive pulmonary disease)   . Diabetes mellitus   . Hypertension   . Thyroid disease   . Hyperlipidemia 10.01.1996  . Atrial fibrillation [id number]  . Subdural hematoma   . Carotid disease, bilateral   . Abdominal hernia   . Colon polyps   . Chronic kidney disease     CKD with baseline Cr 2.7 as of 2/12; right renal mass felt to be benign followed by uro for 3 years w/o sig change, prev bx nondiagnostic  . Obesity   . Chronic pain syndrome   . Neurogenic bladder disorder   . History of endometriosis   . Polymyalgia rheumatica   . History of gastric ulcer   . GERD (gastroesophageal reflux disease)   . Migraines   . H/O: rheumatic fever   . H. pylori infection     as per 02/04/1994 Hospital Discharge summary  . Passive-dependent personality disorder   . Spinal cord injury   . Renal mass     right, assumed benign  . Gastric polyp   . Fatty liver 2002  . Small bowel obstruction   . Carotid artery disease    . Choledocholithiasis   . Anemia   . Depression    Past Surgical History  Procedure Laterality Date  . Appendectomy  02/25/1948  . Laparoscopic ovarian cystectomy  02/25/1948  . Partial hysterectomy  1960  . Total abdominal hysterectomy  1964  . Laminectomy  1964 &1965  . Cholecystectomy  1973  . Carpal tunnel release  1974    left  . Spinal fusion  1982, 1983, 1985  . Ovarian cyst removal  1990  . Abdominal hernia repair  1994, 1996  . Abdominal wound dehiscence  1994  . Dobutamine stress echo  06/01/1997  . Bladder suspension  1950, 1982  . Goiter removal  1959  . Kidney cystectomy  2010  . Dilation and curettage of uterus    . Craniotomy  03/12/2003    with clot evacuation  . Ercp  11/12/2010    with stone removal and stent placement, stone and stent removed 12/25/10   Family History  Problem Relation Age of Onset  . Atrial fibrillation Mother   . Colon cancer Mother   . Heart disease Father   . Heart attack Father   . Diabetes Sister   . Hypertension Sister   . Obesity Brother   . Hypertension Brother   . Hypertension Other   . Diabetes Father   . Stroke Other     Female  1st degree relative <50   History  Substance Use Topics  . Smoking status: Former Smoker    Quit date: 02/25/2004  . Smokeless tobacco: Never Used  . Alcohol Use: No   OB History   Grav Para Term Preterm Abortions TAB SAB Ect Mult Living                 Review of Systems Unable to assess due to mental status.   Allergies  Baclofen; Codeine; Gabapentin; Lisinopril; Warfarin sodium; Zolpidem tartrate; Alprazolam; Amitriptyline hcl; Aspirin; Celecoxib; Clonazepam; Diltiazem hcl; Doxepin hcl; Fluoxetine hcl; Hydrocodone-acetaminophen; Levofloxacin; Methadone; Phenobarbital; Raspberry; Rofecoxib; Pentazocine lactate; and Spiriva  Home Medications   Current Outpatient Rx  Name  Route  Sig  Dispense  Refill  . budesonide-formoterol (SYMBICORT) 160-4.5 MCG/ACT inhaler   Inhalation   Inhale  2 puffs into the lungs 2 (two) times daily.   3 Inhaler   3   . diazepam (VALIUM) 5 MG tablet   Oral   Take 5 mg by mouth every 6 (six) hours as needed for muscle spasms or sedation.          . polyethylene glycol powder (GLYCOLAX/MIRALAX) powder   Oral   Take 17 g by mouth daily.         . diphenhydrAMINE (BENADRYL) 50 MG tablet   Oral   Take 50 mg by mouth at bedtime as needed for itching or allergies.          . DULoxetine (CYMBALTA) 30 MG capsule   Oral   Take 60 mg by mouth daily.         . insulin glargine (LANTUS) 100 UNIT/ML injection   Subcutaneous   Inject 16 Units into the skin as needed.          . insulin lispro (HUMALOG) 100 UNIT/ML injection      Inject per sliding scale, 3 times a day. 150-199- 2 units. 200-249-4 units. 250-299-6 units. 300+ 8 units   30 mL   3   . LANOXIN 125 MCG tablet      TAKE 1 TABLET DAILY   90 tablet   1   . levothyroxine (SYNTHROID, LEVOTHROID) 112 MCG tablet      TAKE 1 TABLET DAILY   90 tablet   1   . metoprolol succinate (TOPROL-XL) 100 MG 24 hr tablet      TAKE 1 TABLET BY MOUTH DAILY   90 tablet   3   . mirabegron ER (MYRBETRIQ) 25 MG TB24 tablet   Oral   Take 25 mg by mouth daily.         Marland Kitchen morphine (MSIR) 15 MG tablet      2 times daily as needed         . omeprazole (PRILOSEC) 20 MG capsule      TAKE 1 CAPSULE BY MOUTH 45 MINUTES PRIOR TO BREAKFAST   90 capsule   1   . ondansetron (ZOFRAN) 4 MG tablet   Oral   Take 1 tablet (4 mg total) by mouth every 8 (eight) hours as needed for nausea.   30 tablet   5   . Oxycodone HCl 10 MG TABS   Oral   Take 2 tablets (20 mg total) by mouth 4 (four) times daily.      0   . promethazine (PHENERGAN) 25 MG tablet   Oral   Take 1 tablet (25 mg total) by mouth every 6 (six) hours as needed for nausea.  30 tablet   5   . ROCALTROL 0.5 MCG capsule      TAKE 1 CAPSULE BY MOUTH DAILY   90 capsule   3    BP 141/83  Pulse 75  Temp(Src)  97.5 F (36.4 C) (Oral)  Resp 20  SpO2 96% Physical Exam  Nursing note and vitals reviewed. Constitutional: She appears well-developed and well-nourished.  HENT:  Head: Normocephalic and atraumatic.  Eyes: EOM are normal. Pupils are equal, round, and reactive to light.  Neck: Normal range of motion. Neck supple.  Cardiovascular: Normal rate, normal heart sounds and intact distal pulses.   Pulmonary/Chest: Effort normal and breath sounds normal. No respiratory distress. She has no wheezes. She has no rales.  Abdominal: Bowel sounds are normal. She exhibits no distension. There is no tenderness. There is no rebound.  Musculoskeletal: Normal range of motion. She exhibits no edema and no tenderness.  Neurological: She is alert. She has normal strength. No cranial nerve deficit or sensory deficit.  Skin: Skin is warm and dry. No rash noted.  Psychiatric: She has a normal mood and affect.    ED Course  Procedures (including critical care time) Labs Review Labs Reviewed  CBC WITH DIFFERENTIAL - Abnormal; Notable for the following:    WBC 10.6 (*)    All other components within normal limits  COMPREHENSIVE METABOLIC PANEL - Abnormal; Notable for the following:    Glucose, Bld 169 (*)    Creatinine, Ser 1.11 (*)    GFR calc non Af Amer 46 (*)    GFR calc Af Amer 54 (*)    All other components within normal limits  URINALYSIS, ROUTINE W REFLEX MICROSCOPIC - Abnormal; Notable for the following:    APPearance CLOUDY (*)    Protein, ur 100 (*)    Leukocytes, UA MODERATE (*)    All other components within normal limits  URINE MICROSCOPIC-ADD ON - Abnormal; Notable for the following:    Squamous Epithelial / LPF MANY (*)    Bacteria, UA MANY (*)    All other components within normal limits   Imaging Review Ct Head Wo Contrast  01/06/2013   CLINICAL DATA:  79 year old female with loss of vision in left eye and altered mental status.  EXAM: CT HEAD WITHOUT CONTRAST  TECHNIQUE: Contiguous  axial images were obtained from the base of the skull through the vertex without intravenous contrast.  COMPARISON:  03/12/2003  FINDINGS: Right craniotomy and mild right frontoparietal encephalomalacia identified.  No acute intracranial abnormalities are identified, including mass lesion or mass effect, hydrocephalus, extra-axial fluid collection, midline shift, hemorrhage, or acute infarction.  No acute bony abnormalities are noted.  IMPRESSION: No evidence of acute intracranial abnormality.   Electronically Signed   By: Laveda Abbe M.D.   On: 01/06/2013 17:13    EKG Interpretation   None       MDM   1. UTI (urinary tract infection)   2. Altered mental status   3. Polypharmacy   4. Noncompliance with medications     Labs and imaging reviewed, CT neg for acute process, UA concerning for UTI. Pharm Tech states she had numerous medications in wrong bottles including morphine tabs distributed among several other daily medications. This was rectified and duplicate bottles were removed. Family at bedside will assume medication administration to ensure she is taking her meds appropriately and are ready to go home. Will give Keflex for UTI and advised close PCP followup.     Charles B.  Bernette Mayers, MD 01/06/13 1750

## 2013-01-06 NOTE — ED Notes (Signed)
Patient transported to CT 

## 2013-01-06 NOTE — Assessment & Plan Note (Signed)
Able to protect airway.  To ER via private care.  Charge RN called.  Pt will likely need head CT this AM.  This is likely med related/polypharmacy but I cannot exclude other causes currently.  D/w pt and family.  If her mentation doesn't improve, she would likely not be able to return home.  Had been living independently prev. >25 min spent with face to face with patient, >50% counseling and/or coordinating care.

## 2013-01-06 NOTE — Patient Instructions (Signed)
Go to the ER at [facility].  Tell them we sent you.  Take care.

## 2013-01-06 NOTE — Progress Notes (Signed)
Pre-visit discussion using our clinic review tool. No additional management support is needed unless otherwise documented below in the visit note.  79 y/o lady with prev longstanding chronic pain on chronic opioids (prev responsible with meds, no known abuse) per pain clinic in [facility] with worsening confusion.  See prev OV note. Unclear if prev cymbalta change had any effect on mentation.  More drowsy this AM, family had trouble getting her on the phone.  Comes to clinic today with family.  Morphine bottle is mislabeled and oxycodone bottle has 2 different kinds of pills in it.  Speech is slower than normal.  She knows year and month but not day/date.  This is abnormal compared to baseline.   She is generally sharp and oriented.    PMH and SH reviewed  ROS: See HPI, otherwise noncontributory.  Meds, vitals, and allergies reviewed.   Nad, speech is fluent but slower than normal Mmm IRR, not tachy ctab CN 2-12 wnl B, S/S wnl. No facial droop.

## 2013-01-09 LAB — URINE CULTURE

## 2013-01-10 ENCOUNTER — Telehealth (HOSPITAL_COMMUNITY): Payer: Self-pay | Admitting: Emergency Medicine

## 2013-01-10 NOTE — ED Notes (Signed)
Post ED Visit - Positive Culture Follow-up: Successful Patient Follow-Up  Culture assessed and recommendations reviewed by: []  Wes Dulaney, Pharm.D., BCPS []  Celedonio Miyamoto, Pharm.D., BCPS []  Georgina Pillion, Pharm.D., BCPS []  [place], [address].D., BCPS, AAHIVP [x]  Estella Husk, Pharm.D., BCPS, AAHIVP  Positive urine culture  []  Patient discharged without antimicrobial prescription and treatment is now indicated [x]  Organism is resistant to prescribed ED discharge antimicrobial []  Patient with positive blood cultures  Changes discussed with ED provider: Fayrene Helper PA-C New antibiotic prescription: Septra DS 1 PO BID x 7 days    [name] 01/10/2013, 11:49 AM

## 2013-01-10 NOTE — Progress Notes (Signed)
ED Antimicrobial Stewardship Positive Culture Follow Up   [name] is an 79 y.o. female who presented to [facility] on 01/06/2013 with a chief complaint of  Chief Complaint  Patient presents with  . Altered Mental Status    Recent Results (from the past 720 hour(s))  URINE CULTURE     Status: None   Collection Time    01/06/13  4:20 PM      Result Value Range Status   Specimen Description URINE, CLEAN CATCH   Final   Special Requests NONE   Final   Culture  Setup Time     Final   Value: 01/07/2013 01:36     Performed at [facility]   Culture     Final   Value: >=100,000 COLONIES/mL ESCHERICHIA COLI     Performed at [facility]   Report Status 01/09/2013 FINAL   Final   Organism ID, Bacteria ESCHERICHIA COLI   Final    [x]  Treated with Keflex, organism resistant to prescribed antimicrobial  New antibiotic prescription: Septra DS 1 tab PO BID x 7 days Patient should stop taking Keflex.  ED Provider: Fayrene Helper, PA-C   Sallee Provencal 01/10/2013, 10:23 AM Infectious Diseases Pharmacist Phone# [phone]

## 2013-01-11 ENCOUNTER — Ambulatory Visit (INDEPENDENT_AMBULATORY_CARE_PROVIDER_SITE_OTHER): Payer: Medicare Other | Admitting: Cardiovascular Disease

## 2013-01-11 ENCOUNTER — Encounter: Payer: Self-pay | Admitting: Cardiovascular Disease

## 2013-01-11 ENCOUNTER — Ambulatory Visit: Payer: Medicare Other | Admitting: Cardiovascular Disease

## 2013-01-11 VITALS — BP 132/64 | HR 75 | Ht 64.0 in | Wt 198.5 lb

## 2013-01-11 DIAGNOSIS — I10 Essential (primary) hypertension: Secondary | ICD-10-CM

## 2013-01-11 DIAGNOSIS — E785 Hyperlipidemia, unspecified: Secondary | ICD-10-CM

## 2013-01-11 DIAGNOSIS — I4891 Unspecified atrial fibrillation: Secondary | ICD-10-CM

## 2013-01-11 DIAGNOSIS — E119 Type 2 diabetes mellitus without complications: Secondary | ICD-10-CM

## 2013-01-11 NOTE — Progress Notes (Signed)
Patient ID: [name], female    DOB: 01/07/1932, 79 y.o.   MRN: [id number]  HPI Comments: [name]  is a 79 year old woman with chronic atrial fibrillation, long smoking history, history of subdural hematoma while on warfarin, chronic renal insufficiency on diuretics with history of spinal cord injury and neurogenic bladder, peripheral vascular disease with 40-59% left internal carotid arterial disease  who presents for routine followup. H/o GI stricture  Overall she is doing well.  Hemoglobin A1c and cholesterol are well controlled    in the hospital January 2014 with asthma attack. Initially started on diuresis with worsening renal failure Creatinine improved back to baseline by the end of her admission   Stop smoking many years ago She has chronic back and leg pain.  She denies any significant chest pain.   Recently he was sent to the emergency room for mental status changes. Evaluated in the ER 01/06/2013. CT scan of the head showed no stroke. She does report losing vision in her left eye over the course of the past year. She has been evaluated by ophthalmology. She denies stroke as a cause of her vision loss. We had a long discussion with her again about anticoagulation. She does not want to change any of her medications, does not want aspirin, Plavix or warfarin.  Echocardiogram 02/27/2012 showing normal ejection fraction greater than 55%, moderately dilated left atrium, moderately elevated right ventricular systolic pressure 30-40 mm of mercury EKG shows atrial fibrillation with ventricular rate 75 beats per minute, no significant ST or T wave changes      Outpatient Encounter Prescriptions as of 01/11/2013  Medication Sig  . atorvastatin (LIPITOR) 10 MG tablet Take 10 mg by mouth daily.  . budesonide-formoterol (SYMBICORT) 160-4.5 MCG/ACT inhaler Inhale 2 puffs into the lungs 2 (two) times daily.  . calcitRIOL (ROCALTROL) 0.5 MCG capsule Take 0.5 mcg by mouth daily.  .  cephALEXin (KEFLEX) 500 MG capsule Take 500 mg by mouth 2 (two) times daily.  . diazepam (VALIUM) 5 MG tablet Take 5 mg by mouth every 6 (six) hours as needed for muscle spasms or sedation.   . digoxin (LANOXIN) 0.125 MG tablet Take 0.125 mg by mouth daily.  . diphenhydrAMINE (BENADRYL) 50 MG tablet Take 50 mg by mouth at bedtime as needed for itching or allergies.   . DULoxetine (CYMBALTA) 30 MG capsule Take 60 mg by mouth daily.  . furosemide (LASIX) 80 MG tablet Take 160 mg by mouth daily.   . insulin glargine (LANTUS) 100 UNIT/ML injection Inject 16 Units into the skin as needed (for CBG control).   . insulin lispro (HUMALOG) 100 UNIT/ML injection Inject per sliding scale, 3 times a day. 150-199- 2 units. 200-249-4 units. 250-299-6 units. 300+ 8 units  . insulin lispro (HUMALOG) 100 UNIT/ML injection Inject 0-8 Units into the skin 3 (three) times daily with meals. Inject per sliding scale, 3 times a day. 150-199- 2 units. 200-249-4 units. 250-299-6 units. 300+ 8 units  . ipratropium (ATROVENT HFA) 17 MCG/ACT inhaler Inhale 2 puffs into the lungs 2 (two) times daily.  Marland Kitchen levothyroxine (SYNTHROID, LEVOTHROID) 112 MCG tablet Take 112 mcg by mouth daily before breakfast.  . metoprolol succinate (TOPROL-XL) 100 MG 24 hr tablet Take 100 mg by mouth daily. Take with or immediately following a meal.  . mirabegron ER (MYRBETRIQ) 25 MG TB24 tablet Take 25 mg by mouth daily.  Marland Kitchen morphine (MSIR) 15 MG tablet Take 15 mg by mouth 2 (two) times daily as  needed for moderate pain or severe pain.   . NONFORMULARY OR COMPOUNDED ITEM Apply 1 application topically every other day.   Marland Kitchen omeprazole (PRILOSEC) 20 MG capsule Take 20 mg by mouth daily before breakfast. Takes 45 minutes before breakfast  . Oxycodone HCl 10 MG TABS Take 2 tablets (20 mg total) by mouth 4 (four) times daily.  . polyethylene glycol powder (GLYCOLAX/MIRALAX) powder Take 17 g by mouth daily.  . promethazine (PHENERGAN) 25 MG tablet Take 1 tablet  (25 mg total) by mouth every 6 (six) hours as needed for nausea.  . [DISCONTINUED] cephALEXin (KEFLEX) 500 MG capsule Take 1 capsule (500 mg total) by mouth 3 (three) times daily.  . [DISCONTINUED] rosuvastatin (CRESTOR) 40 MG tablet Take 40 mg by mouth every evening.    Review of Systems  Constitutional: Negative.   HENT: Negative.   Eyes: Negative.   Respiratory: Negative.   Cardiovascular: Negative.   Gastrointestinal: Negative.   Musculoskeletal: Positive for back pain and gait problem.  Skin: Negative.   Neurological: Negative.   Psychiatric/Behavioral: Negative.   All other systems reviewed and are negative.    BP 132/64  Pulse 75  Ht 5\' 4"  (1.626 m)  Wt 198 lb 8 oz (90.039 kg)  BMI 34.06 kg/m2  Physical Exam  Nursing note and vitals reviewed. Constitutional: She is oriented to person, place, and time. She appears well-developed and well-nourished.  Presents in a wheelchair. Obese.  HENT:  Head: Normocephalic.  Nose: Nose normal.  Mouth/Throat: Oropharynx is clear and moist.  Eyes: Conjunctivae are normal. Pupils are equal, round, and reactive to light.  Neck: Normal range of motion. Neck supple. No JVD present. Carotid bruit is present.  Cardiovascular: Normal rate, S1 normal, S2 normal and intact distal pulses.  An irregularly irregular rhythm present. Exam reveals no gallop and no friction rub.   Murmur heard.  Crescendo systolic murmur is present with a grade of 2/6  Trace bilateral leg edema  Pulmonary/Chest: Effort normal and breath sounds normal. No respiratory distress. She has no wheezes. She has no rales. She exhibits no tenderness.  Abdominal: Soft. Bowel sounds are normal. She exhibits no distension. There is no tenderness.  Musculoskeletal: Normal range of motion. She exhibits no edema and no tenderness.  Lymphadenopathy:    She has no cervical adenopathy.  Neurological: She is alert and oriented to person, place, and time. Coordination normal.  Skin:  Skin is warm and dry. No rash noted. No erythema.  Psychiatric: She has a normal mood and affect. Her behavior is normal. Judgment and thought content normal.    Assessment and Plan

## 2013-01-11 NOTE — Assessment & Plan Note (Signed)
We have encouraged continued exercise, careful diet management in an effort to lose weight. 

## 2013-01-11 NOTE — Assessment & Plan Note (Signed)
Cholesterol is at goal on the current lipid regimen. No changes to the medications were made.  

## 2013-01-11 NOTE — Assessment & Plan Note (Signed)
Blood pressure is well controlled on today's visit. No changes made to the medications. 

## 2013-01-11 NOTE — Patient Instructions (Signed)
You are doing well. No medication changes were made.  Please call us if you have new issues that need to be addressed before your next appt.  Your physician wants you to follow-up in: 6 months.  You will receive a reminder letter in the mail two months in advance. If you don't receive a letter, please call our office to schedule the follow-up appointment.   

## 2013-01-11 NOTE — Assessment & Plan Note (Signed)
Rate is well controlled. She's not interested in anticoagulation, aspirin and Plavix

## 2013-02-06 ENCOUNTER — Inpatient Hospital Stay: Payer: Self-pay | Admitting: Internal Medicine

## 2013-02-06 LAB — URINALYSIS, COMPLETE
Blood: NEGATIVE
Glucose,UR: NEGATIVE mg/dL (ref 0–75)
Ketone: NEGATIVE
Leukocyte Esterase: NEGATIVE
Ph: 5 (ref 4.5–8.0)
Protein: 100
RBC,UR: <1 /HPF (ref 0–5)
Specific Gravity: 1.012 (ref 1.003–1.030)
Squamous Epithelial: 1

## 2013-02-06 LAB — COMPREHENSIVE METABOLIC PANEL
Albumin: 3 g/dL — ABNORMAL LOW (ref 3.4–5.0)
Alkaline Phosphatase: 100 U/L
BUN: 31 mg/dL — ABNORMAL HIGH (ref 7–18)
Bilirubin,Total: 0.9 mg/dL (ref 0.2–1.0)
Co2: 36 mmol/L — ABNORMAL HIGH (ref 21–32)
Creatinine: 1.34 mg/dL — ABNORMAL HIGH (ref 0.60–1.30)
EGFR (Non-African Amer.): 38 — ABNORMAL LOW
Osmolality: 285 (ref 275–301)
SGPT (ALT): 25 U/L (ref 12–78)
Total Protein: 6.6 g/dL (ref 6.4–8.2)

## 2013-02-06 LAB — PRO B NATRIURETIC PEPTIDE: B-Type Natriuretic Peptide: 3001 pg/mL — ABNORMAL HIGH (ref 0–450)

## 2013-02-06 LAB — CK TOTAL AND CKMB (NOT AT ARMC): CK, Total: 46 U/L (ref 21–215)

## 2013-02-06 LAB — CBC
HCT: 38.5 % (ref 35.0–47.0)
HGB: 13.2 g/dL (ref 12.0–16.0)
MCH: 29.2 pg (ref 26.0–34.0)
MCHC: 34.3 g/dL (ref 32.0–36.0)
MCV: 85 fL (ref 80–100)
RBC: 4.52 10*6/uL (ref 3.80–5.20)
RDW: 14.3 % (ref 11.5–14.5)
WBC: 9.6 10*3/uL (ref 3.6–11.0)

## 2013-02-06 LAB — TROPONIN I: Troponin-I: <0.02 ng/mL

## 2013-02-06 LAB — RAPID INFLUENZA A&B ANTIGENS

## 2013-02-06 LAB — T4, FREE: Free Thyroxine: 2.21 ng/dL — ABNORMAL HIGH (ref 0.76–1.46)

## 2013-02-07 ENCOUNTER — Telehealth: Payer: Self-pay | Admitting: *Deleted

## 2013-02-07 ENCOUNTER — Ambulatory Visit: Payer: Self-pay | Admitting: Internal Medicine

## 2013-02-07 ENCOUNTER — Telehealth: Payer: Self-pay | Admitting: Family Medicine

## 2013-02-07 LAB — BASIC METABOLIC PANEL
Anion Gap: 6 — ABNORMAL LOW (ref 7–16)
EGFR (African American): 51 — ABNORMAL LOW
EGFR (Non-African Amer.): 44 — ABNORMAL LOW
Glucose: 89 mg/dL (ref 65–99)
Osmolality: 282 (ref 275–301)

## 2013-02-07 LAB — AMMONIA: Ammonia, Plasma: <25 mcmol/L (ref 11–32)

## 2013-02-07 LAB — DIGOXIN LEVEL: Digoxin: 1.99 ng/mL

## 2013-02-07 NOTE — Telephone Encounter (Signed)
Pt's sister Gwyneth Revels called and wanted Dr. Para March informed that [name] was taken by ambulance to [facility] 02/06/13 where she is being tested for Dementia. If needed, best numbers to call Michaele Offer is [phone], best number to call Burna Mortimer (the caretaker) is [phone] or [phone].

## 2013-02-07 NOTE — Telephone Encounter (Signed)
Noted, see next phone note.

## 2013-02-07 NOTE — Telephone Encounter (Signed)
Thanks

## 2013-02-07 NOTE — Telephone Encounter (Signed)
Called [facility] to get an update on the pt. Beth advise me that there is no new updates about pt to give, but pt is doing better, she isn't as confused today and she did some PT today, when I spoke with Beth pt was up, alert and sitting in her chair in her room.  I faxed over a medication list to [facility] because they didn't have an updated list

## 2013-02-10 ENCOUNTER — Encounter: Payer: Self-pay | Admitting: Internal Medicine

## 2013-02-10 LAB — BASIC METABOLIC PANEL
BUN: 11 mg/dL (ref 7–18)
Chloride: 107 mmol/L (ref 98–107)
Co2: 32 mmol/L (ref 21–32)
Creatinine: 0.97 mg/dL (ref 0.60–1.30)
EGFR (African American): >60
Glucose: 119 mg/dL — ABNORMAL HIGH (ref 65–99)
Osmolality: 284 (ref 275–301)
Sodium: 142 mmol/L (ref 136–145)

## 2013-02-11 ENCOUNTER — Telehealth: Payer: Self-pay | Admitting: Family Medicine

## 2013-02-11 DIAGNOSIS — I4891 Unspecified atrial fibrillation: Secondary | ICD-10-CM

## 2013-02-11 DIAGNOSIS — M549 Dorsalgia, unspecified: Secondary | ICD-10-CM

## 2013-02-11 DIAGNOSIS — F05 Delirium due to known physiological condition: Secondary | ICD-10-CM

## 2013-02-11 DIAGNOSIS — E119 Type 2 diabetes mellitus without complications: Secondary | ICD-10-CM

## 2013-02-11 LAB — CULTURE, BLOOD (SINGLE)

## 2013-02-11 NOTE — Telephone Encounter (Signed)
[facility] Triage Call Report Triage Record Num: [id number] Operator: Lodema Pilot Patient Name: [name] Call Date & Time: 02/10/2013 10:21:31PM Patient Phone: [phone] PCP: Crawford Givens Patient Gender: Female PCP Fax : Patient DOB: Oct 22, 1931 [facility] Name[facility] Reason for Call: Caller: Tom/RN from [facility]; PCP: Crawford Givens; CB#: [phone]; Call regarding Has chronic back pain.; Vital signs: Temp: 97.9 Oral, HR 62, RR 22, BP 125/68. Pt was admitted around 1600. Pt has a history of back pain. Pt complained of lower back pain at 1600. Pt was given Tylenol. At 2000, pt was given Ultram 50 mg PO. At 2210, pt complained of back pain. RN attempted to give Tylenol, but pt wanted stronger medication. Per Back Symptoms Protocol, (+) "Mild to moderate pain in back with normal activity or rest and not responding to 72 hours of home care." Standing order of Ultram 50 mg PO every 4 hours x 1. RN to follow up in AM. RN confirmed with verbal order read back. Care advice given. Protocol(s) Used: Back Symptoms Recommended Outcome per Protocol: See Provider within 72 Hours Override Outcome if Used in Protocol: Provide Home/Self Care RN Reason for Override Outcome: Rx Standing Orders Used. Reason for Outcome: Mild to moderate pain in back with normal activity or rest AND not responding to 72 hours of home care Care Advice: ~ Call provider if symptoms worsen or new symptoms develop. ~ SYMPTOM / CONDITION MANAGEMENT 02/10/2013 10:43:31PM Page 1 of 1 [email or web address]_[email or web address]_V2

## 2013-02-11 NOTE — Telephone Encounter (Signed)
I will be seeing her this afternoon and I can assess her analgesics

## 2013-02-11 NOTE — Telephone Encounter (Signed)
I believe Dr. Alphonsus Sias is seeing patient today.  Forwarded.

## 2013-02-14 ENCOUNTER — Telehealth: Payer: Self-pay | Admitting: Family Medicine

## 2013-02-14 NOTE — Telephone Encounter (Signed)
[name] (niece) says she was taken to the hospital by ambulance last weekend for disorientation, sleeping all the time, not eating or drinking, mixing up her medications, etc.  She was then sent to [facility] for Rehab upon discharge from the hospital and is doing better.  They have weaned her off of a lot of her pain medications which Dr. Alphonsus Sias thinks was causing a lot of her problems.  [name] was not aware of the diarrhea episode but says she is going to have lunch with her today and will get an update.  Apparently this call was made from the patient's nurse at [facility].

## 2013-02-14 NOTE — Telephone Encounter (Signed)
Please call and get update on patient. Thanks.  

## 2013-02-14 NOTE — Telephone Encounter (Signed)
[facility] Triage Call Report Triage Record Num: [id number] Operator: Claudie Leach Patient Name: [name] Call Date & Time: 02/11/2013 10:26:41PM Patient Phone: [phone] PCP: Tillman Abide Patient Gender: Female PCP Fax : [phone] Patient DOB: 28-Aug-1931 Practice Name[facility] Reason for Call: Caller: Tom/RN; PCP: Tillman Abide (Family Practice); CB#: [phone]; Call regarding loose stools, has a lot of allergies to meds; Has had 4 diarrhea stools on 02/11/13. Started Amoxicillin on 02/10/13. Afebrile. Has a standing order for Kaopectate 30 ml after each loose stool and is checking to see if ok to take with patient's medication allergies. Triaged per Diarrhea or Other Change in Bowel Habits. Care advice given. Advised to check with the pharmacist and notified Dr. Beverely Low and agreed. Caller made aware and agreed. Protocol(s) Used: Diarrhea or Other Change in Bowel Habits Recommended Outcome per Protocol: Call Provider within 72 Hours Reason for Outcome: Symptoms began after a change or starting a new prescription or nonprescription medicine or alternative medicine / therapy Care Advice: ~ Call provider if symptoms worsen or new symptoms develop. ~ SYMPTOM / CONDITION MANAGEMENT 02/11/2013 11:11:38PM Page 1 of 1 [email or web address]_[email or web address]

## 2013-02-14 NOTE — Telephone Encounter (Signed)
Noted, thanks!

## 2013-02-22 ENCOUNTER — Other Ambulatory Visit: Payer: Self-pay | Admitting: Family Medicine

## 2013-02-24 ENCOUNTER — Ambulatory Visit: Payer: Self-pay | Admitting: Internal Medicine

## 2013-02-28 DIAGNOSIS — E119 Type 2 diabetes mellitus without complications: Secondary | ICD-10-CM

## 2013-02-28 DIAGNOSIS — M549 Dorsalgia, unspecified: Secondary | ICD-10-CM

## 2013-02-28 DIAGNOSIS — F3289 Other specified depressive episodes: Secondary | ICD-10-CM

## 2013-02-28 DIAGNOSIS — I4891 Unspecified atrial fibrillation: Secondary | ICD-10-CM

## 2013-02-28 DIAGNOSIS — F329 Major depressive disorder, single episode, unspecified: Secondary | ICD-10-CM

## 2013-03-01 ENCOUNTER — Other Ambulatory Visit: Payer: Self-pay

## 2013-03-01 MED ORDER — LEVOTHYROXINE SODIUM 88 MCG PO TABS: 88.0000 ug | ORAL_TABLET | Freq: Every day | ORAL | Status: DC

## 2013-03-01 MED ORDER — MIRABEGRON ER 25 MG PO TB24: 25.0000 mg | ORAL_TABLET | Freq: Every day | ORAL | Status: DC

## 2013-03-01 MED ORDER — DULOXETINE HCL 30 MG PO CPEP: 30.0000 mg | ORAL_CAPSULE | Freq: Every day | ORAL | Status: DC

## 2013-03-01 MED ORDER — CALCITRIOL 0.5 MCG PO CAPS: 0.5000 ug | ORAL_CAPSULE | Freq: Every day | ORAL | Status: DC

## 2013-03-01 NOTE — Telephone Encounter (Signed)
Okay to refill meds Continue the current doses---though I may want to put her back up to the 60mg  of the cymbalta at her follow up (so if she needs, just do #30 x 0 of that)

## 2013-03-01 NOTE — Telephone Encounter (Signed)
rx sent to pharmacy by e-script Spoke with patient and advised results   

## 2013-03-01 NOTE — Telephone Encounter (Signed)
Pt was discharged from [place] today and pt request refills calcitriol, myrbetriq and cymbalta 30 mg was changed to take one capsule daily (on med list take 60 mg daily) and levothyroxine was decreased to 88 mcg taking one daily. [facility].Please advise.

## 2013-03-02 ENCOUNTER — Telehealth: Payer: Self-pay | Admitting: *Deleted

## 2013-03-02 NOTE — Telephone Encounter (Signed)
Patient notified

## 2013-03-02 NOTE — Telephone Encounter (Signed)
I didn't prescribe it I think she should just not take any med for her bladder and we will discuss this at her follow up appt

## 2013-03-02 NOTE — Telephone Encounter (Signed)
Sherry with [facility] left v/m for verification on cymbalta 30 mg taking one daily and Levothyroxine 88 mcg daily. Spoke with Shanon Brow at [facility] [facility] and verified that was correct.

## 2013-03-02 NOTE — Telephone Encounter (Signed)
Received notice from pharmacy that insurance will not cover pt's myrbetriq 25 mg until  Pt has tried and failed 12 weeks of Detrol or Oxybutynin. Is a switch to either of these medications appropriate for patient?

## 2013-03-02 NOTE — Telephone Encounter (Signed)
yes

## 2013-03-02 NOTE — Telephone Encounter (Signed)
Pt is aware, but pt wants to know if she is supposed to be taking Lanoxin and metoprolol together

## 2013-03-03 ENCOUNTER — Encounter: Payer: Self-pay | Admitting: Family Medicine

## 2013-03-08 ENCOUNTER — Other Ambulatory Visit: Payer: Self-pay | Admitting: *Deleted

## 2013-03-08 ENCOUNTER — Ambulatory Visit (INDEPENDENT_AMBULATORY_CARE_PROVIDER_SITE_OTHER): Payer: Medicare Other | Admitting: Internal Medicine

## 2013-03-08 ENCOUNTER — Encounter: Payer: Self-pay | Admitting: Internal Medicine

## 2013-03-08 VITALS — BP 150/70 | HR 68 | Temp 98.3°F | Wt 186.0 lb

## 2013-03-08 DIAGNOSIS — R4182 Altered mental status, unspecified: Secondary | ICD-10-CM

## 2013-03-08 DIAGNOSIS — I4891 Unspecified atrial fibrillation: Secondary | ICD-10-CM

## 2013-03-08 DIAGNOSIS — E119 Type 2 diabetes mellitus without complications: Secondary | ICD-10-CM

## 2013-03-08 DIAGNOSIS — G894 Chronic pain syndrome: Secondary | ICD-10-CM

## 2013-03-08 MED ORDER — FUROSEMIDE 40 MG PO TABS: 40.0000 mg | ORAL_TABLET | Freq: Every day | ORAL | Status: AC

## 2013-03-08 MED ORDER — DULOXETINE HCL 30 MG PO CPEP: 30.0000 mg | ORAL_CAPSULE | Freq: Every day | ORAL | Status: DC

## 2013-03-08 MED ORDER — INSULIN GLARGINE 100 UNIT/ML ~~LOC~~ SOLN: 16.0000 [IU] | SUBCUTANEOUS | Status: DC | PRN

## 2013-03-08 MED ORDER — IPRATROPIUM BROMIDE HFA 17 MCG/ACT IN AERS: 2.0000 | INHALATION_SPRAY | Freq: Two times a day (BID) | RESPIRATORY_TRACT | Status: DC

## 2013-03-08 MED ORDER — BUDESONIDE-FORMOTEROL FUMARATE 160-4.5 MCG/ACT IN AERO: 2.0000 | INHALATION_SPRAY | Freq: Two times a day (BID) | RESPIRATORY_TRACT | Status: DC

## 2013-03-08 MED ORDER — LEVOTHYROXINE SODIUM 88 MCG PO TABS: 88.0000 ug | ORAL_TABLET | Freq: Every day | ORAL | Status: DC

## 2013-03-08 NOTE — Patient Instructions (Addendum)
Please decrease the fingersticks to twice a day---fasting and then rotate the other times (before lunch, supper and bedtime).  Please use your humidifier. You can try loratadine 10mg  or cetirizine 10mg  at bedtime to help the nasal drainage. If it continues, let me know and I will prescribe a nasal spray.

## 2013-03-08 NOTE — Assessment & Plan Note (Signed)
Resolved Probably from the narcotics Doing well alone at home again

## 2013-03-08 NOTE — Progress Notes (Signed)
Subjective:    Patient ID: [name], female    DOB: 1931-07-29, 80 y.o.   MRN: [id number]  HPI Here with Mariann Laster, her niece  Back home and doing well Not back to baseline but much better Lives alone with dog Gets a little mixed up but no major cognitive changes  Ongoing pain Has been using the tramadol tid and still has some breakthrough Discussed that she can take extra for prn  Had been checking sugars 6 times a day 90-145 No clinical hypoglycemic reactions  Has improved with PT Able to walk the length of mobile home with walker without stopping Still continuing the therapy for now  Mood has been fairly good Continues on the cymbalta Doing okay on the lower dose  Having nasal congestion  Clear post nasal drip when she lies down  Current Outpatient Prescriptions on File Prior to Visit  Medication Sig Dispense Refill  . acetaminophen (TYLENOL) 650 MG CR tablet Take 650 mg by mouth 3 (three) times daily.      . budesonide-formoterol (SYMBICORT) 160-4.5 MCG/ACT inhaler Inhale 2 puffs into the lungs 2 (two) times daily.  3 Inhaler  3  . calcitRIOL (ROCALTROL) 0.5 MCG capsule Take 1 capsule (0.5 mcg total) by mouth daily.  30 capsule  0  . digoxin (LANOXIN) 0.125 MG tablet Take 0.125 mg by mouth daily.      . DULoxetine (CYMBALTA) 30 MG capsule Take 1 capsule (30 mg total) by mouth daily.  30 capsule  0  . furosemide (LASIX) 40 MG tablet Take 40 mg by mouth daily.      . insulin glargine (LANTUS) 100 UNIT/ML injection Inject 16 Units into the skin as needed (for CBG control).       Marland Kitchen ipratropium (ATROVENT HFA) 17 MCG/ACT inhaler Inhale 2 puffs into the lungs 2 (two) times daily.      Marland Kitchen levothyroxine (SYNTHROID, LEVOTHROID) 88 MCG tablet Take 1 tablet (88 mcg total) by mouth daily before breakfast.  30 tablet  0  . lisinopril (PRINIVIL,ZESTRIL) 20 MG tablet Take 20 mg by mouth 2 (two) times daily.      . mirabegron ER (MYRBETRIQ) 25 MG TB24 tablet Take 1 tablet (25 mg  total) by mouth daily.  30 tablet  0  . omeprazole (PRILOSEC) 20 MG capsule TAKE 1 CAPSULE 45 MINUTES PRIOR TO BREAKFAST  90 capsule  1  . polyethylene glycol powder (GLYCOLAX/MIRALAX) powder Take 17 g by mouth daily.      . traMADol (ULTRAM) 50 MG tablet Take 50 mg by mouth 3 (three) times daily as needed.       No current facility-administered medications on file prior to visit.    Allergies  Allergen Reactions  . Baclofen Other (See Comments)    Slurred speech and disorientation  . Codeine Anaphylaxis  . Gabapentin Other (See Comments)    Slurred speech and disorientation  . Lisinopril Other (See Comments)    REACTION: stopped because of kidney failure  . Warfarin Sodium Anaphylaxis  . Zolpidem Tartrate Other (See Comments)    Slurred speech and disorientation  . Alprazolam Hives  . Amitriptyline Hcl Hives  . Aspirin Nausea And Vomiting  . Celecoxib Nausea Only  . Clonazepam Hives  . Diltiazem Hcl Hives  . Doxepin Hcl Hives  . Fluoxetine Hcl Other (See Comments)    Muscle pains and cramps  . Hydrocodone-Acetaminophen Hives  . Levofloxacin Hives and Itching  . Methadone Hives  . Phenobarbital Other (See Comments)  Causes hyperactivity  . Raspberry Hives  . Rofecoxib Nausea And Vomiting  . Sulfa Antibiotics Hives  . Pentazocine Lactate Other (See Comments)  . Spiriva [Tiotropium Bromide Monohydrate] Other (See Comments)    unknown    Past Medical History  Diagnosis Date  . Asthma   . COPD (chronic obstructive pulmonary disease)   . Diabetes mellitus   . Hypertension   . Thyroid disease   . Hyperlipidemia 10.01.1996  . Atrial fibrillation [id number]  . Subdural hematoma   . Carotid disease, bilateral   . Abdominal hernia   . Colon polyps   . Chronic kidney disease     CKD with baseline Cr 2.7 as of 2/12; right renal mass felt to be benign followed by uro for 3 years w/o sig change, prev bx nondiagnostic  . Obesity   . Chronic pain syndrome   . Neurogenic  bladder disorder   . History of endometriosis   . Polymyalgia rheumatica   . History of gastric ulcer   . GERD (gastroesophageal reflux disease)   . Migraines   . H/O: rheumatic fever   . H. pylori infection     as per 02/04/1994 Hospital Discharge summary  . Passive-dependent personality disorder   . Spinal cord injury   . Renal mass     right, assumed benign  . Gastric polyp   . Fatty liver 2002  . Small bowel obstruction   . Carotid artery disease   . Choledocholithiasis   . Anemia   . Depression     Past Surgical History  Procedure Laterality Date  . Appendectomy  02/25/1948  . Laparoscopic ovarian cystectomy  02/25/1948  . Partial hysterectomy  1960  . Total abdominal hysterectomy  1964  . Laminectomy  1964 &1965  . Cholecystectomy  1973  . Carpal tunnel release  1974    left  . Spinal fusion  [place]  . Ovarian cyst removal  1990  . Abdominal hernia repair  1994, 1996  . Abdominal wound dehiscence  1994  . Dobutamine stress echo  06/01/1997  . Bladder suspension  1950, 1982  . Goiter removal  1959  . Kidney cystectomy  2010  . Dilation and curettage of uterus    . Craniotomy  03/12/2003    with clot evacuation  . Ercp  11/12/2010    with stone removal and stent placement, stone and stent removed 12/25/10    Family History  Problem Relation Age of Onset  . Atrial fibrillation Mother   . Colon cancer Mother   . Heart disease Father   . Heart attack Father   . Diabetes Sister   . Hypertension Sister   . Obesity Brother   . Hypertension Brother   . Hypertension Other   . Diabetes Father   . Stroke Other     Female 1st degree relative <50    History   Social History  . Marital Status: Divorced    Spouse Name: N/A    Number of Children: 2  . Years of Education: N/A   Occupational History  . Retired     [employer]   Social History Main Topics  . Smoking status: Former Smoker    Quit date: 02/25/2004  . Smokeless tobacco: Never Used    . Alcohol Use: No  . Drug Use: No  . Sexual Activity: Not on file   Other Topics Concern  . Not on file   Social History Narrative   0 caffeine drinks  daily    Review of Systems Not sleepy in daytime anymore Sleep is fragmented--- 2 hours at a time at most. Pain keeps her up mostly    Objective:   Physical Exam  Constitutional: She appears well-developed. No distress.  Neck: Normal range of motion. Neck supple.  Cardiovascular: Normal rate.  Exam reveals no gallop.   No murmur heard. irregular  Pulmonary/Chest: Effort normal and breath sounds normal. No respiratory distress. She has no wheezes. She has no rales.  Musculoskeletal:  Thick legs without pitting  Lymphadenopathy:    She has no cervical adenopathy.  Psychiatric: She has a normal mood and affect. Her behavior is normal.          Assessment & Plan:

## 2013-03-08 NOTE — Progress Notes (Signed)
Pre-visit discussion using our clinic review tool. No additional management support is needed unless otherwise documented below in the visit note.  

## 2013-03-08 NOTE — Assessment & Plan Note (Signed)
Good rate control on dig No anticoagulation due to past intracerebral bleed

## 2013-03-08 NOTE — Telephone Encounter (Signed)
Asking for 3 mth supply

## 2013-03-08 NOTE — Assessment & Plan Note (Signed)
Still with good control No changes needed

## 2013-03-08 NOTE — Assessment & Plan Note (Signed)
Ongoing but doing okay with the tramadol instead

## 2013-03-09 ENCOUNTER — Ambulatory Visit: Payer: Medicare Other | Admitting: Internal Medicine

## 2013-03-09 ENCOUNTER — Ambulatory Visit: Payer: Self-pay | Admitting: Podiatry

## 2013-03-09 ENCOUNTER — Telehealth: Payer: Self-pay

## 2013-03-09 MED ORDER — TRAMADOL HCL 50 MG PO TABS: 50.0000 mg | ORAL_TABLET | Freq: Three times a day (TID) | ORAL | Status: DC | PRN

## 2013-03-09 NOTE — Telephone Encounter (Signed)
Relevant patient education mailed to patient.  

## 2013-03-09 NOTE — Telephone Encounter (Signed)
Actual sig is 50mg  tid AND 50mg  bid prn Maximal daily dose is 5 Okay #450 x 0

## 2013-03-09 NOTE — Telephone Encounter (Signed)
rx printed and faxed with [facility] form back to [facility]

## 2013-03-11 ENCOUNTER — Telehealth: Payer: Self-pay | Admitting: *Deleted

## 2013-03-11 NOTE — Telephone Encounter (Signed)
[facility] sent fax saying there are 2 sets of directions on this prescription.    This is actually on the same Rx.  One set of directions says 3 times a day as needed and it also says twice daily as needed.  Please advise.

## 2013-03-13 NOTE — Telephone Encounter (Signed)
I believe this came through Dr. Silvio Pate; I will ask for his input on the tramadol rx.  Thanks.

## 2013-03-14 NOTE — Telephone Encounter (Signed)
Re-typed the directions in a more clear form and re-faxed to [facility].

## 2013-03-14 NOTE — Telephone Encounter (Signed)
That is correct Tid is a standing order She can also take it bid prn as additional Maximum daily dose is 5 per day

## 2013-03-15 ENCOUNTER — Telehealth: Payer: Self-pay | Admitting: *Deleted

## 2013-03-15 NOTE — Telephone Encounter (Signed)
See faxed request on your desk, [facility] will not authorize SYMBICORT but would like you to chose a covered alternative. Please advise

## 2013-03-15 NOTE — Telephone Encounter (Signed)
Spoke with patient and advised results, she is ok with the change.  Dr. Silvio Pate on the form the Advair you checked is an inhaler, but in the strength you listed 250/50 and that is a diskus. Please advise if this should be an inhaler or diskus.  Form on your desk

## 2013-03-15 NOTE — Telephone Encounter (Signed)
I am okay with changing to advair inhaler Just make sure she is okay with it before sending form in

## 2013-03-16 NOTE — Telephone Encounter (Signed)
Form faxed back and scanned 

## 2013-03-16 NOTE — Telephone Encounter (Signed)
I corrected it Forgot they had crazy different doses for the inhaler---probably a patent move

## 2013-03-18 ENCOUNTER — Other Ambulatory Visit: Payer: Self-pay | Admitting: *Deleted

## 2013-03-18 MED ORDER — FLUTICASONE-SALMETEROL 500-50 MCG/DOSE IN AEPB: 1.0000 | INHALATION_SPRAY | Freq: Two times a day (BID) | RESPIRATORY_TRACT | Status: DC

## 2013-03-18 NOTE — Addendum Note (Signed)
Addended by: Despina Hidden on: 03/18/2013 03:19 PM   Modules accepted: Orders

## 2013-03-18 NOTE — Telephone Encounter (Signed)
Form faxed back and scanned 

## 2013-03-18 NOTE — Telephone Encounter (Signed)
Will change to the diskus Make sure patient checks with pharmacist about how to use it properly

## 2013-03-18 NOTE — Telephone Encounter (Signed)
Now the Advair inhaler that was a option on the fax on 03/15/13 is out of stock, they are now asking to replace it with ADVAIR DISKUS 500/50. Please see form on your desk.

## 2013-03-24 ENCOUNTER — Other Ambulatory Visit: Payer: Self-pay | Admitting: *Deleted

## 2013-03-24 NOTE — Telephone Encounter (Signed)
Faxed refill request. Please advise.  Previous Rx for #450 was sent to [facility].

## 2013-03-24 NOTE — Telephone Encounter (Signed)
This should match up with recs from Dr. Silvio Pate.  I'll defer to him on this, since he just saw her.

## 2013-03-25 NOTE — Telephone Encounter (Signed)
Spoke with pt and advised this [facility], I also called [facility] and they did receive the rx and will be mailing it out soon. Pt would like to know if she can get a temporary supply thru [facility] until the mail order arrives. Please advise

## 2013-03-25 NOTE — Telephone Encounter (Signed)
See 03/11/2013 phone note, rx was changed and [facility] to [facility]

## 2013-03-25 NOTE — Telephone Encounter (Signed)
Yes, we just did that big 3 month supply Find out why this request came in

## 2013-03-25 NOTE — Telephone Encounter (Signed)
This request is from [facility].  I'm thinking that the Rx from [facility] for the 3 month supply must have not arrived yet.  That's why I stated that there was a different request from [facility]. Please advise.

## 2013-03-25 NOTE — Telephone Encounter (Signed)
No answer at pt's home or cell number. I also called [facility] and they sent the refill because the pt requested it. I explained that we gave pt printed rx for #450 to be sent to mail order on 03/09/13.

## 2013-03-25 NOTE — Telephone Encounter (Signed)
Let me know if I need to do anything on this.  Thanks.

## 2013-03-27 MED ORDER — TRAMADOL HCL 50 MG PO TABS: 50.0000 mg | ORAL_TABLET | Freq: Four times a day (QID) | ORAL | Status: DC | PRN

## 2013-03-27 NOTE — Telephone Encounter (Signed)
Please call in #30, no rf. Thanks.

## 2013-03-28 ENCOUNTER — Telehealth: Payer: Self-pay | Admitting: *Deleted

## 2013-03-28 NOTE — Telephone Encounter (Signed)
Received a faxed form to be completed regarding diabetic supplies.  Spoke to patient and was advised that she does use this medical supply company. Paperwork is in your in box. Patient wanted to let you know that she uses [facility] syringes and gets #100 in a box at a time and she test for blood sugar 4 times a day.

## 2013-03-28 NOTE — Telephone Encounter (Signed)
Please fax back with OV note from 03/08/13.

## 2013-03-28 NOTE — Telephone Encounter (Signed)
Rx called to pharmacy. Tried to call patient and no answer at home number, voicemail has not been set up on cell phone.

## 2013-03-28 NOTE — Telephone Encounter (Signed)
Patient notified that script has been called to the pharmacy.

## 2013-03-29 ENCOUNTER — Other Ambulatory Visit: Payer: Self-pay | Admitting: Family Medicine

## 2013-03-29 MED ORDER — LISINOPRIL 20 MG PO TABS: 20.0000 mg | ORAL_TABLET | Freq: Two times a day (BID) | ORAL | Status: DC

## 2013-03-29 NOTE — Telephone Encounter (Signed)
Patient left a voicemail stating that she was looking over her medication list and realized that she does not have the Lisinopril. Patient stated that Dr. Silvio Pate started her on this when she was at [facility] and will need a refill if she is to continue taking this. Pharmacy [facility]. Please let the patient know that this has been done. See allergy/contraindication.

## 2013-03-29 NOTE — Telephone Encounter (Signed)
Sent. Thanks.   

## 2013-03-30 ENCOUNTER — Telehealth: Payer: Self-pay | Admitting: Family Medicine

## 2013-03-30 MED ORDER — LISINOPRIL 20 MG PO TABS: 20.0000 mg | ORAL_TABLET | Freq: Two times a day (BID) | ORAL | Status: AC

## 2013-03-30 NOTE — Telephone Encounter (Signed)
error 

## 2013-03-30 NOTE — Telephone Encounter (Signed)
Pt has been out of lisinopril for one month since left [facility]. Home health nurse checked BP today 170/90. Pt has had h/a for 3 days.Pt already has appt for 04/12/13 to see Dr Damita Dunnings; pt does not want to come before 04/12/13. Pt request lisinopril sent to [facility] until can get mail order supply. Pt request cb. Pt also wants to know why we did not call in her Tramadol; spoke with [facility] and pt got Tramadol # 30 on 03/26/13 so it is too early to fill 03/28/13 rx of tramadol. Pt said she wants Tramadol # [facility] sent to [facility] so it will not be too expensive. Pt request cb.

## 2013-03-30 NOTE — Telephone Encounter (Signed)
Left message on answering machine at home number to call back. Called the mobile number and got her niece Mariann Laster. Mariann Laster notified as instructed by telephone. Mariann Laster stated that she will get in touch with the patient and relay the information. Mariann Laster stated that she will have patient call and update Dr. Damita Dunnings regarding her BP and headache.

## 2013-03-30 NOTE — Telephone Encounter (Signed)
Restart the lisinopril in meantime- sent to [facility].  She had tramadol sent to mail order already.  Would use what she has in the meantime.  Please update me on BP and HA in near future.   Thanks.

## 2013-03-30 NOTE — Telephone Encounter (Signed)
Completed form faxed back along with office notes as instructed. Form sent to scanning.

## 2013-03-30 NOTE — Telephone Encounter (Signed)
[facility] is calling concerning pt.

## 2013-04-06 ENCOUNTER — Ambulatory Visit (INDEPENDENT_AMBULATORY_CARE_PROVIDER_SITE_OTHER): Payer: Medicare Other | Admitting: Podiatry

## 2013-04-06 ENCOUNTER — Encounter: Payer: Self-pay | Admitting: Podiatry

## 2013-04-06 VITALS — BP 134/72 | HR 80 | Resp 16 | Ht 64.0 in | Wt 182.0 lb

## 2013-04-06 DIAGNOSIS — M79609 Pain in unspecified limb: Secondary | ICD-10-CM

## 2013-04-06 DIAGNOSIS — B351 Tinea unguium: Secondary | ICD-10-CM

## 2013-04-06 NOTE — Progress Notes (Signed)
She presents today with a chief complaint of painful toenails bilateral.  Objective: Pulses are palpable bilateral. Nails are thick yellow dystrophic lytic mycotic painful palpation and severely ingrown.  Assessment: Pain in limb secondary to onychomycosis and ingrown nails bilateral.  Plan: Debridement of nails 1 through 5 bilateral

## 2013-04-12 ENCOUNTER — Ambulatory Visit: Payer: Medicare Other | Admitting: Family Medicine

## 2013-04-21 ENCOUNTER — Ambulatory Visit: Payer: Medicare Other | Admitting: Family Medicine

## 2013-04-25 ENCOUNTER — Ambulatory Visit (INDEPENDENT_AMBULATORY_CARE_PROVIDER_SITE_OTHER): Payer: Medicare Other | Admitting: Family Medicine

## 2013-04-25 ENCOUNTER — Encounter: Payer: Self-pay | Admitting: Family Medicine

## 2013-04-25 VITALS — BP 156/70 | HR 70 | Temp 98.1°F | Wt 185.8 lb

## 2013-04-25 DIAGNOSIS — G894 Chronic pain syndrome: Secondary | ICD-10-CM

## 2013-04-25 DIAGNOSIS — E119 Type 2 diabetes mellitus without complications: Secondary | ICD-10-CM

## 2013-04-25 DIAGNOSIS — N184 Chronic kidney disease, stage 4 (severe): Secondary | ICD-10-CM

## 2013-04-25 DIAGNOSIS — K111 Hypertrophy of salivary gland: Secondary | ICD-10-CM

## 2013-04-25 DIAGNOSIS — R4182 Altered mental status, unspecified: Secondary | ICD-10-CM

## 2013-04-25 DIAGNOSIS — J3489 Other specified disorders of nose and nasal sinuses: Secondary | ICD-10-CM

## 2013-04-25 LAB — COMPREHENSIVE METABOLIC PANEL
ALT: 10 U/L (ref 0–35)
AST: 15 U/L (ref 0–37)
Albumin: 3.7 g/dL (ref 3.5–5.2)
Alkaline Phosphatase: 61 U/L (ref 39–117)
BUN: 39 mg/dL — ABNORMAL HIGH (ref 6–23)
CO2: 24 mEq/L (ref 19–32)
Calcium: 10 mg/dL (ref 8.4–10.5)
Chloride: 105 mEq/L (ref 96–112)
Creatinine, Ser: 1.1 mg/dL (ref 0.4–1.2)
GFR: 50.37 mL/min — ABNORMAL LOW (ref >60.00)
GLUCOSE: 65 mg/dL — AB (ref 70–99)
Potassium: 4.2 mEq/L (ref 3.5–5.1)
Sodium: 138 mEq/L (ref 135–145)
TOTAL PROTEIN: 6.8 g/dL (ref 6.0–8.3)
Total Bilirubin: 0.7 mg/dL (ref 0.3–1.2)

## 2013-04-25 LAB — HEMOGLOBIN A1C: HEMOGLOBIN A1C: 5.6 % (ref 4.6–6.5)

## 2013-04-25 MED ORDER — LORATADINE 10 MG PO TABS: 10.0000 mg | ORAL_TABLET | Freq: Every day | ORAL | Status: DC

## 2013-04-25 MED ORDER — FLUTICASONE PROPIONATE 50 MCG/ACT NA SUSP: 2.0000 | Freq: Every day | NASAL | Status: DC

## 2013-04-25 NOTE — Progress Notes (Signed)
Pre visit review using our clinic review tool, if applicable. No additional management support is needed unless otherwise documented below in the visit note.  Mult issues to review.   Mentation back to baseline off pain meds.  Pain greatly increased but mentally back to normal.  Chronic back pain, wheelchair bound.  Mood is good in spite of pain.  Knot on R side of neck, tender, noted for a few days.  No trauma. Single lesion.  No fevers.   Diabetes:  Using medications without difficulties:yes Hypoglycemic episodes:no Hyperglycemic episodes:no Feet problems:no Blood Sugars averaging: see log Weight loss noted.  She is needing less insulin now.  No mealtime coverage needed.   Due for labs.    Rhinorrhea noted.  Asking about options.  Not relieved with claritin.  Clear rhinorrhea.    PMH and SH reviewed  Meds, vitals, and allergies reviewed.   ROS: See HPI.  Otherwise negative.    GEN: nad, alert and oriented, uncomfortable but sharp mentation HEENT: mucous membranes moist, some clear rhinorrhea noted NECK: supple w/o LA, but appears to have a swollen salivary gland under the tongue on the R side CV: rrr. PULM: ctab, no inc wob ABD: soft, +bs EXT: trace edema SKIN: no acute rash

## 2013-04-25 NOTE — Patient Instructions (Signed)
Try flonase with the claritin.  Eat some lemon candy.  Go to the lab on the way out.  We'll contact you with your lab report. We'll look at your pain meds when I can see the labs.  Take care.

## 2013-04-26 ENCOUNTER — Other Ambulatory Visit: Payer: Self-pay | Admitting: Family Medicine

## 2013-04-26 ENCOUNTER — Encounter: Payer: Self-pay | Admitting: *Deleted

## 2013-04-26 DIAGNOSIS — K111 Hypertrophy of salivary gland: Secondary | ICD-10-CM | POA: Insufficient documentation

## 2013-04-26 DIAGNOSIS — J3489 Other specified disorders of nose and nasal sinuses: Secondary | ICD-10-CM | POA: Insufficient documentation

## 2013-04-26 MED ORDER — TRAMADOL HCL 50 MG PO TABS: 50.0000 mg | ORAL_TABLET | Freq: Three times a day (TID) | ORAL | Status: DC | PRN

## 2013-04-26 NOTE — Assessment & Plan Note (Signed)
We can try higher dose of tramadol, up to 6 pills a day.  lyrica may be an option, but I'd like to limit new meds as much as possible.  See notes on labs.  >25 minutes spent in face to face time with patient, >50% spent in counselling or coordination of care

## 2013-04-26 NOTE — Assessment & Plan Note (Signed)
Should resolve with lemon candy.  D/w pt.

## 2013-04-26 NOTE — Assessment & Plan Note (Signed)
Add on flonase with steroid caution.

## 2013-04-26 NOTE — Assessment & Plan Note (Signed)
Resolved off opiates, tolerates tramadol.

## 2013-04-26 NOTE — Assessment & Plan Note (Signed)
Avoid nsaids, see notes on labs.

## 2013-04-26 NOTE — Assessment & Plan Note (Signed)
Continue as is, see notes on labs.   

## 2013-05-06 ENCOUNTER — Ambulatory Visit: Payer: Medicare Other | Admitting: Family Medicine

## 2013-05-07 ENCOUNTER — Other Ambulatory Visit: Payer: Self-pay | Admitting: Family Medicine

## 2013-05-16 ENCOUNTER — Other Ambulatory Visit: Payer: Self-pay

## 2013-05-16 MED ORDER — TRAMADOL HCL 50 MG PO TABS: 50.0000 mg | ORAL_TABLET | Freq: Three times a day (TID) | ORAL | Status: DC | PRN

## 2013-05-16 NOTE — Telephone Encounter (Signed)
No, 6 a day max. We can add on lyrica for the pain if needed.  I printed it for 6 a day, let me know if she wants to try the lyrica.   Please fax the tramadol rx.  Thanks.

## 2013-05-16 NOTE — Telephone Encounter (Signed)
Pt said on 04/25/13 pt was told could increase tramadol 50 mg taking 2 tabs four times a day; pt is taking at 8AM-2PM-8PM and 2AM. Reviewed instructions from lab result note not to exceed 6 tabs daily and pt said that was incorrect. Pt request new rx sent to [facility]; pt said usually gets quantity #450. Pt request cb.Please advise.

## 2013-05-17 ENCOUNTER — Other Ambulatory Visit: Payer: Self-pay | Admitting: *Deleted

## 2013-05-17 MED ORDER — PREGABALIN 75 MG PO CAPS: 75.0000 mg | ORAL_CAPSULE | Freq: Two times a day (BID) | ORAL | Status: DC

## 2013-05-17 NOTE — Telephone Encounter (Signed)
Rx. Faxed.  Patient advised.  Patient states that she wasn't sure that she told you at the last visit that her legs hurt so bad at night, they jump and it disturbs her sleep.  Please advise.

## 2013-05-17 NOTE — Telephone Encounter (Signed)
Patient advised.

## 2013-05-17 NOTE — Telephone Encounter (Signed)
Printed lyrica.  Start with 1 twice a day and report back about pain and sleep. Thanks.

## 2013-05-17 NOTE — Telephone Encounter (Signed)
Rx faxed to mail order pharmacy. 

## 2013-05-30 ENCOUNTER — Telehealth: Payer: Self-pay

## 2013-05-30 NOTE — Telephone Encounter (Signed)
Pt said Tramadol is not working for back pain; pain level now 7; pt is taking Tramadol daily usually 6 pills daily. Extra strength Tylenol is not helping. Pt said she does not want to go back on morphine. Pt said she could take oxycodone if appropriate. Pt request cb. Pt request 2 weeks supply to [facility] and then [facility]. Pt said nerve pain in bottom of feet are still bothering pt also.  Pt said she needs another pain med today. Pt request cb.

## 2013-05-30 NOTE — Telephone Encounter (Signed)
Difficult situation given pain med intolerances and altered mental status in the past.   This is something that needs PCP review - will await his return tomorrow.  Continue tramadol for today.  plz notify patient.

## 2013-05-30 NOTE — Telephone Encounter (Signed)
Agree with all below.  Thanks to all.  Please call her tomorrow and see if she has started the lyrica.  If so, then get an update on that.  We'll go from there.  Thanks.

## 2013-05-30 NOTE — Telephone Encounter (Signed)
Unable to reach patient by telephone or leave a message. Called patient's niece Mariann Laster) and was advised that patient may be sleeping. Abbie Sons of what Dr. Danise Mina advised and that Dr. Damita Dunnings will be in touch with them tomorrow (Tuesday).

## 2013-05-31 MED ORDER — PREGABALIN 75 MG PO CAPS: 75.0000 mg | ORAL_CAPSULE | Freq: Two times a day (BID) | ORAL | Status: DC

## 2013-05-31 NOTE — Telephone Encounter (Signed)
I would try the lyrica before anything else.  Please call it in.  Thanks.

## 2013-05-31 NOTE — Telephone Encounter (Signed)
Medication phoned to pharmacy.  

## 2013-05-31 NOTE — Telephone Encounter (Signed)
Patient's Lyrica required a PA and patient says she sees on the internet that it has now been approved but it will take several days for it to be delivered to her from her mail order pharmacy.  Will you agree to a short term Rx to a local pharmacy?  If so, [facility].

## 2013-06-01 ENCOUNTER — Other Ambulatory Visit (HOSPITAL_COMMUNITY): Payer: Self-pay | Admitting: *Deleted

## 2013-06-01 DIAGNOSIS — I6529 Occlusion and stenosis of unspecified carotid artery: Secondary | ICD-10-CM

## 2013-06-03 ENCOUNTER — Other Ambulatory Visit: Payer: Self-pay | Admitting: *Deleted

## 2013-06-03 NOTE — Telephone Encounter (Signed)
Medication has been approved through [place].  PA faxed to [facility].

## 2013-06-03 NOTE — Telephone Encounter (Signed)
We sent it locally because I thought she had a delay with mail order and wanted to get started.  With her pain situation, I would try the local rx first just to see how she does and then we can do the longer term mail order if needed.

## 2013-06-03 NOTE — Telephone Encounter (Signed)
Pt left v/m; pt request Dr Damita Dunnings to prescribe Lyrica 75 mg taking 1 cap twice a day to [facility] by calling [phone]. Pt said she has 0 copay for 3 month rx and locally pt has to pay $49.00 for one month supply.Please advise.

## 2013-06-07 ENCOUNTER — Telehealth: Payer: Self-pay | Admitting: *Deleted

## 2013-06-07 DIAGNOSIS — M79643 Pain in unspecified hand: Secondary | ICD-10-CM

## 2013-06-07 NOTE — Telephone Encounter (Signed)
Patient says she spoke with you some time ago concerning the pain in her left hand.  She had surgery about 3 years ago by Dr. Fredna Dow.  She was not pleased and does not want to see him again.  However, she says the pain and swelling is getting worse in her thumb and up into her wrist along with muscle atrophy.  Please advise.   Patient also states that she is taking the Ultram as prescribed but feels that she needs to take an additional tablet around 2 or 3 am because the pain exacerbates at that time.  I explained that this was the maximum dosage that is recommended and that you were hopeful that the Lyrica would help to control the pain also.  Patient says the Lyrica is definitely helping.

## 2013-06-07 NOTE — Telephone Encounter (Signed)
Faxed a copy of the approval letter to the patient.

## 2013-06-07 NOTE — Telephone Encounter (Signed)
Pt left vm stating that she has been talking to [place] about her Lyrica RX that still needs to be approved.  She has enough to last 2 more weeks.  She said she gave Dr. Damita Dunnings a phone number to call to approve it and it has not been approved.

## 2013-06-07 NOTE — Telephone Encounter (Signed)
I thought this was approved. Let me know if I need to do anything.  Thanks.

## 2013-06-08 NOTE — Telephone Encounter (Signed)
We can get the referral put in for ortho, at a different clinic.  I wouldn't inc the tramadol.  I would give the lyrica a little more time.  If she is still having a lot of pain, we may need to increase that but I wouldn't do it yet.  Thanks.

## 2013-06-08 NOTE — Telephone Encounter (Signed)
Patient notified as instructed by telephone. Advised patient that the referral coordinator will be in touch with her to get this scheduled. 

## 2013-06-17 ENCOUNTER — Encounter (INDEPENDENT_AMBULATORY_CARE_PROVIDER_SITE_OTHER): Payer: Medicare Other

## 2013-06-17 DIAGNOSIS — I6529 Occlusion and stenosis of unspecified carotid artery: Secondary | ICD-10-CM

## 2013-06-22 ENCOUNTER — Telehealth: Payer: Self-pay | Admitting: *Deleted

## 2013-06-22 NOTE — Telephone Encounter (Signed)
Sent clearance letter.

## 2013-06-22 NOTE — Telephone Encounter (Signed)
Juliann Pulse with Dr. Milly Jakob with [facility].  Needs surgical clearance and wants to know if patient can have outpatient surgery or needs to go to main hospital based on medical history.  Patient having hand surgery. Wants to schedule stat!!!!! She faxed this on 06/17/13

## 2013-06-23 ENCOUNTER — Other Ambulatory Visit: Payer: Self-pay | Admitting: Orthopedic Surgery

## 2013-06-23 ENCOUNTER — Encounter (HOSPITAL_BASED_OUTPATIENT_CLINIC_OR_DEPARTMENT_OTHER): Payer: Self-pay | Admitting: *Deleted

## 2013-06-23 NOTE — Progress Notes (Signed)
Pt has cardiac-medical clearance-chrnic pain with rods in back-blind lt eye-does not drive-has to sleep hob up-she may have to stay [facility]-does not have family to stay with her-to bring all meds and overnight bag-will talk to anesthesia-does not want general unless has to

## 2013-06-24 NOTE — H&P (Signed)
[name] is an 80 y.o. female.   CC / Reason for Visit: Left hand issues HPI: This patient is a 80 year old female who presents for evaluation of her left hand.  She reports 2 different problems.  The first is a trigger digit of the ring finger that she has had for a long time.  She has had 2 previous injections, each one helping for about 2 months. The other problem relates to the thumb.  She reports a subungual lesion that is very painful with touch, worsening over the past couple years.  Exposure to cold air or water worsens it.  She has not had an MRI for this lesion and reports that she is advised not to undergo MRI scan because of the hardware in her back for spine fusion performed many years ago.  Each of these problems presents for issues as she either uses a motorized scooter or a walker for ambulation.  She has been using Ultram and Lyrica.  Past Medical History  Diagnosis Date  . Asthma   . COPD (chronic obstructive pulmonary disease)   . Diabetes mellitus   . Hypertension   . Thyroid disease   . Hyperlipidemia 10.01.1996  . Atrial fibrillation [id number]  . Subdural hematoma   . Carotid disease, bilateral   . Abdominal hernia   . Colon polyps   . Chronic kidney disease     CKD with baseline Cr 2.7 as of 2/12; right renal mass felt to be benign followed by uro for 3 years w/o sig change, prev bx nondiagnostic  . Obesity   . Chronic pain syndrome   . Neurogenic bladder disorder   . History of endometriosis   . Polymyalgia rheumatica   . History of gastric ulcer   . GERD (gastroesophageal reflux disease)   . Migraines   . H/O: rheumatic fever   . H. pylori infection     as per 02/04/1994 Hospital Discharge summary  . Passive-dependent personality disorder   . Spinal cord injury   . Renal mass     right, assumed benign  . Gastric polyp   . Fatty liver 2002  . Small bowel obstruction   . Carotid artery disease   . Choledocholithiasis   . Anemia   . Depression   .  Blind left eye   . Full dentures   . HOH (hard of hearing)   . Wears glasses     Past Surgical History  Procedure Laterality Date  . Appendectomy  02/25/1948  . Laparoscopic ovarian cystectomy  02/25/1948  . Partial hysterectomy  1960  . Total abdominal hysterectomy  1964  . Laminectomy  1964 &1965  . Cholecystectomy  1973  . Carpal tunnel release  1974    left  . Spinal fusion  [place]  . Ovarian cyst removal  1990  . Abdominal hernia repair  1994, 1996  . Abdominal wound dehiscence  1994  . Dobutamine stress echo  06/01/1997  . Bladder suspension  1950, 1982  . Goiter removal  1959  . Kidney cystectomy  2010  . Dilation and curettage of uterus    . Craniotomy  03/12/2003    with clot evacuation  . Ercp  11/12/2010    with stone removal and stent placement, stone and stent removed 12/25/10    Family History  Problem Relation Age of Onset  . Atrial fibrillation Mother   . Colon cancer Mother   . Heart disease Father   . Heart attack  Father   . Diabetes Sister   . Hypertension Sister   . Obesity Brother   . Hypertension Brother   . Hypertension Other   . Diabetes Father   . Stroke Other     Female 1st degree relative <50   Social History:  reports that she quit smoking about 9 years ago. She has never used smokeless tobacco. She reports that she does not drink alcohol or use illicit drugs.  Allergies:  Allergies  Allergen Reactions  . Baclofen Other (See Comments)    Slurred speech and disorientation  . Codeine Anaphylaxis  . Gabapentin Other (See Comments)    Slurred speech and disorientation  . Lisinopril Other (See Comments)    REACTION: stopped because of kidney failure  . Morphine And Related     Altered mentation- can tolerate tramadol   . Warfarin Sodium Anaphylaxis  . Zolpidem Tartrate Other (See Comments)    Slurred speech and disorientation  . Alprazolam Hives  . Amitriptyline Hcl Hives  . Aspirin Nausea And Vomiting  . Celecoxib Nausea  Only  . Clonazepam Hives  . Diltiazem Hcl Hives  . Doxepin Hcl Hives  . Fluoxetine Hcl Other (See Comments)    Muscle pains and cramps  . Hydrocodone-Acetaminophen Hives  . Levofloxacin Hives and Itching  . Methadone Hives  . Phenobarbital Other (See Comments)    Causes hyperactivity  . Raspberry Hives  . Rofecoxib Nausea And Vomiting  . Sulfa Antibiotics Hives  . Pentazocine Lactate Other (See Comments)  . Spiriva [Tiotropium Bromide Monohydrate] Other (See Comments)    unknown    No prescriptions prior to admission    No results found for this or any previous visit (from the past 48 hour(s)). No results found.  Review of Systems  All other systems reviewed and are negative.   Height 5\' 4"  (1.626 m), weight 83.915 kg (185 lb). Physical Exam  Constitutional:  WD, WN, NAD HEENT:  NCAT, EOMI Neuro/Psych:  Alert & oriented to person, place, and time; appropriate mood & affect Lymphatic: No generalized UE edema or lymphadenopathy Extremities / MSK:  Both UE are normal with respect to appearance, ranges of motion, joint stability, muscle strength/tone, sensation, & perfusion except as otherwise noted:  The left thumb is really not sensitive to light touch on any aspects of skin, but she reports altered sensibility with light touching of the thumb tip.  She also indicates that the thumb tip has a good degree of sensory deprivation as she avoids touching many things with it because of the pain from the subungual lesion.  There is some discoloration in the midportion of the nail, and that seems a rust color, and this is exquisitely tender even with light palpation down onto the nail.  Left ring finger seems to be a typical trigger, with distinct overt locking occurring with each cycle.  Labs / X-rays:  3 views of the left thumb ordered and obtained today reveal no abnormalities of the distal phalanx.  Assessment: 1.  Left ring finger stenosing tenosynovitis 2.  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  I wish that she were able to undergo MRI evaluation of her left thumb, but it appears that this is not possible.  I indicated that we could simply remove the nail and explore the region without imaging, but I would also confer with my radiology colleagues regarding whether it would be any possible utility an ultrasound or CT  scan evaluation.  I suspect that I will find that neither of those studies are helpful.  She would like to have both of these addressed in a single surgical setting, and we will confer with her regarding timing at the same time that I conveyed to her what we have determined with regard to advanced imaging possibilities.   Jolyn Nap 06/24/2013, 5:02 PM

## 2013-06-24 NOTE — Addendum Note (Signed)
Addended by: Grandville Silos, Jahlil Ziller A on: 06/24/2013 12:59 PM   Modules accepted: Orders

## 2013-06-27 ENCOUNTER — Ambulatory Visit (HOSPITAL_BASED_OUTPATIENT_CLINIC_OR_DEPARTMENT_OTHER)
Admission: RE | Admit: 2013-06-27 | Discharge: 2013-06-27 | Disposition: A | Discharge status: Home / Self Care | Payer: Medicare Other | Source: Ambulatory Visit | Attending: Orthopedic Surgery | Admitting: Orthopedic Surgery

## 2013-06-27 ENCOUNTER — Encounter (HOSPITAL_BASED_OUTPATIENT_CLINIC_OR_DEPARTMENT_OTHER): Payer: Medicare Other | Admitting: Certified Registered"

## 2013-06-27 ENCOUNTER — Encounter (HOSPITAL_BASED_OUTPATIENT_CLINIC_OR_DEPARTMENT_OTHER): Payer: Self-pay | Admitting: Certified Registered"

## 2013-06-27 ENCOUNTER — Ambulatory Visit (HOSPITAL_BASED_OUTPATIENT_CLINIC_OR_DEPARTMENT_OTHER): Payer: Medicare Other | Admitting: Certified Registered"

## 2013-06-27 ENCOUNTER — Encounter (HOSPITAL_BASED_OUTPATIENT_CLINIC_OR_DEPARTMENT_OTHER)
Admission: RE | Disposition: A | Discharge status: Home / Self Care | Payer: Self-pay | Source: Ambulatory Visit | Attending: Orthopedic Surgery

## 2013-06-27 DIAGNOSIS — E079 Disorder of thyroid, unspecified: Secondary | ICD-10-CM | POA: Insufficient documentation

## 2013-06-27 DIAGNOSIS — J4489 Other specified chronic obstructive pulmonary disease: Secondary | ICD-10-CM | POA: Insufficient documentation

## 2013-06-27 DIAGNOSIS — I2510 Atherosclerotic heart disease of native coronary artery without angina pectoris: Secondary | ICD-10-CM | POA: Insufficient documentation

## 2013-06-27 DIAGNOSIS — Z882 Allergy status to sulfonamides status: Secondary | ICD-10-CM | POA: Insufficient documentation

## 2013-06-27 DIAGNOSIS — Z888 Allergy status to other drugs, medicaments and biological substances status: Secondary | ICD-10-CM | POA: Insufficient documentation

## 2013-06-27 DIAGNOSIS — F3289 Other specified depressive episodes: Secondary | ICD-10-CM | POA: Insufficient documentation

## 2013-06-27 DIAGNOSIS — E669 Obesity, unspecified: Secondary | ICD-10-CM | POA: Insufficient documentation

## 2013-06-27 DIAGNOSIS — D1801 Hemangioma of skin and subcutaneous tissue: Secondary | ICD-10-CM | POA: Insufficient documentation

## 2013-06-27 DIAGNOSIS — F329 Major depressive disorder, single episode, unspecified: Secondary | ICD-10-CM | POA: Insufficient documentation

## 2013-06-27 DIAGNOSIS — K219 Gastro-esophageal reflux disease without esophagitis: Secondary | ICD-10-CM | POA: Insufficient documentation

## 2013-06-27 DIAGNOSIS — M353 Polymyalgia rheumatica: Secondary | ICD-10-CM | POA: Insufficient documentation

## 2013-06-27 DIAGNOSIS — M6530 Trigger finger, unspecified finger: Secondary | ICD-10-CM | POA: Insufficient documentation

## 2013-06-27 DIAGNOSIS — Z87891 Personal history of nicotine dependence: Secondary | ICD-10-CM | POA: Insufficient documentation

## 2013-06-27 DIAGNOSIS — J449 Chronic obstructive pulmonary disease, unspecified: Secondary | ICD-10-CM | POA: Insufficient documentation

## 2013-06-27 DIAGNOSIS — E119 Type 2 diabetes mellitus without complications: Secondary | ICD-10-CM | POA: Insufficient documentation

## 2013-06-27 DIAGNOSIS — Z886 Allergy status to analgesic agent status: Secondary | ICD-10-CM | POA: Insufficient documentation

## 2013-06-27 DIAGNOSIS — D649 Anemia, unspecified: Secondary | ICD-10-CM | POA: Insufficient documentation

## 2013-06-27 DIAGNOSIS — H5440 Blindness, one eye, unspecified eye: Secondary | ICD-10-CM | POA: Insufficient documentation

## 2013-06-27 DIAGNOSIS — M659 Unspecified synovitis and tenosynovitis, unspecified site: Secondary | ICD-10-CM | POA: Insufficient documentation

## 2013-06-27 DIAGNOSIS — I4891 Unspecified atrial fibrillation: Secondary | ICD-10-CM | POA: Insufficient documentation

## 2013-06-27 DIAGNOSIS — K7689 Other specified diseases of liver: Secondary | ICD-10-CM | POA: Insufficient documentation

## 2013-06-27 DIAGNOSIS — Z885 Allergy status to narcotic agent status: Secondary | ICD-10-CM | POA: Insufficient documentation

## 2013-06-27 DIAGNOSIS — Z6833 Body mass index (BMI) 33.0-33.9, adult: Secondary | ICD-10-CM | POA: Insufficient documentation

## 2013-06-27 DIAGNOSIS — I129 Hypertensive chronic kidney disease with stage 1 through stage 4 chronic kidney disease, or unspecified chronic kidney disease: Secondary | ICD-10-CM | POA: Insufficient documentation

## 2013-06-27 DIAGNOSIS — E785 Hyperlipidemia, unspecified: Secondary | ICD-10-CM | POA: Insufficient documentation

## 2013-06-27 DIAGNOSIS — N189 Chronic kidney disease, unspecified: Secondary | ICD-10-CM | POA: Insufficient documentation

## 2013-06-27 HISTORY — DX: Presence of dental prosthetic device (complete) (partial): Z97.2

## 2013-06-27 HISTORY — PX: MASS EXCISION: SHX2000

## 2013-06-27 HISTORY — DX: Complete loss of teeth, unspecified cause, unspecified class: K08.109

## 2013-06-27 HISTORY — DX: Blindness, one eye, unspecified eye: H54.40

## 2013-06-27 HISTORY — DX: Unspecified hearing loss, unspecified ear: H91.90

## 2013-06-27 HISTORY — DX: Presence of spectacles and contact lenses: Z97.3

## 2013-06-27 HISTORY — PX: TRIGGER FINGER RELEASE: SHX641

## 2013-06-27 LAB — POCT I-STAT, CHEM 8
BUN: 35 mg/dL — ABNORMAL HIGH (ref 6–23)
Calcium, Ion: 1.28 mmol/L (ref 1.13–1.30)
Chloride: 103 meq/L (ref 96–112)
Creatinine, Ser: 1.2 mg/dL — ABNORMAL HIGH (ref 0.50–1.10)
Glucose, Bld: 126 mg/dL — ABNORMAL HIGH (ref 70–99)
HCT: 38 % (ref 36.0–46.0)
Hemoglobin: 12.9 g/dL (ref 12.0–15.0)
Potassium: 4.6 meq/L (ref 3.7–5.3)
Sodium: 140 meq/L (ref 137–147)
TCO2: 28 mmol/L (ref 0–100)

## 2013-06-27 LAB — GLUCOSE, CAPILLARY: Glucose-Capillary: 109 mg/dL — ABNORMAL HIGH (ref 70–99)

## 2013-06-27 SURGERY — EXCISION MASS
Anesthesia: General | Site: Thumb | Laterality: Left

## 2013-06-27 MED ORDER — LACTATED RINGERS IV SOLN: INTRAVENOUS | Status: DC

## 2013-06-27 MED ORDER — BUPIVACAINE-EPINEPHRINE 0.5% -1:200000 IJ SOLN
INTRAMUSCULAR | Status: DC | PRN
Start: 2013-06-27 — End: 2013-06-27
  Administered 2013-06-27: 8 mL

## 2013-06-27 MED ORDER — OXYCODONE HCL 5 MG PO TABS
5.0000 mg | ORAL_TABLET | Freq: Once | ORAL | Status: AC
  Administered 2013-06-27: 5 mg via ORAL

## 2013-06-27 MED ORDER — PROPOFOL 10 MG/ML IV BOLUS
INTRAVENOUS | Status: DC | PRN
  Administered 2013-06-27: 30 mg via INTRAVENOUS

## 2013-06-27 MED ORDER — LACTATED RINGERS IV SOLN
INTRAVENOUS | Status: DC
  Administered 2013-06-27: 08:00:00 via INTRAVENOUS

## 2013-06-27 MED ORDER — ONDANSETRON HCL 4 MG/2ML IJ SOLN: 4.0000 mg | Freq: Four times a day (QID) | INTRAMUSCULAR | Status: DC | PRN

## 2013-06-27 MED ORDER — BUPIVACAINE-EPINEPHRINE (PF) 0.5% -1:200000 IJ SOLN
INTRAMUSCULAR | Status: AC
  Filled 2013-06-27: qty 30

## 2013-06-27 MED ORDER — OXYCODONE HCL 5 MG PO TABS
ORAL_TABLET | ORAL | Status: AC
  Filled 2013-06-27: qty 1

## 2013-06-27 MED ORDER — FENTANYL CITRATE 0.05 MG/ML IJ SOLN
INTRAMUSCULAR | Status: AC
  Filled 2013-06-27: qty 6

## 2013-06-27 MED ORDER — CEFAZOLIN SODIUM-DEXTROSE 2-3 GM-% IV SOLR
INTRAVENOUS | Status: AC
  Filled 2013-06-27: qty 50

## 2013-06-27 MED ORDER — LIDOCAINE HCL 1 % IJ SOLN
INTRAMUSCULAR | Status: DC | PRN
  Administered 2013-06-27: 8 mL

## 2013-06-27 MED ORDER — FENTANYL CITRATE 0.05 MG/ML IJ SOLN
INTRAMUSCULAR | Status: DC | PRN
  Administered 2013-06-27: 25 ug via INTRAVENOUS

## 2013-06-27 MED ORDER — PROPOFOL 10 MG/ML IV EMUL
INTRAVENOUS | Status: AC
  Filled 2013-06-27: qty 100

## 2013-06-27 MED ORDER — ONDANSETRON HCL 4 MG/2ML IJ SOLN
INTRAMUSCULAR | Status: DC | PRN
  Administered 2013-06-27: 4 mg via INTRAVENOUS

## 2013-06-27 MED ORDER — OXYCODONE-ACETAMINOPHEN 5-325 MG PO TABS: 1.0000 | ORAL_TABLET | ORAL | Status: DC | PRN

## 2013-06-27 MED ORDER — LIDOCAINE HCL (CARDIAC) 20 MG/ML IV SOLN
INTRAVENOUS | Status: DC | PRN
  Administered 2013-06-27: 30 mg via INTRAVENOUS

## 2013-06-27 MED ORDER — OXYCODONE HCL 5 MG/5ML PO SOLN
ORAL | Status: AC
Start: 2013-06-27 — End: 2013-06-27
  Filled 2013-06-27: qty 5

## 2013-06-27 MED ORDER — FENTANYL CITRATE 0.05 MG/ML IJ SOLN: 50.0000 ug | INTRAMUSCULAR | Status: DC | PRN

## 2013-06-27 MED ORDER — CEFAZOLIN SODIUM-DEXTROSE 2-3 GM-% IV SOLR: 2.0000 g | INTRAVENOUS | Status: DC

## 2013-06-27 MED ORDER — CHLORHEXIDINE GLUCONATE 4 % EX LIQD: 60.0000 mL | Freq: Once | CUTANEOUS | Status: DC

## 2013-06-27 MED ORDER — LIDOCAINE HCL (PF) 1 % IJ SOLN
INTRAMUSCULAR | Status: AC
  Filled 2013-06-27: qty 30

## 2013-06-27 MED ORDER — MIDAZOLAM HCL 2 MG/2ML IJ SOLN: 1.0000 mg | INTRAMUSCULAR | Status: DC | PRN

## 2013-06-27 MED ORDER — FENTANYL CITRATE 0.05 MG/ML IJ SOLN
25.0000 ug | INTRAMUSCULAR | Status: DC | PRN
  Administered 2013-06-27: 25 ug via INTRAVENOUS

## 2013-06-27 MED ORDER — FENTANYL CITRATE 0.05 MG/ML IJ SOLN
INTRAMUSCULAR | Status: AC
Start: 2013-06-27 — End: 2013-06-27
  Filled 2013-06-27: qty 2

## 2013-06-27 SURGICAL SUPPLY — 44 items
BANDAGE COBAN STERILE 2 (GAUZE/BANDAGES/DRESSINGS) IMPLANT
BLADE 15 SAFETY STRL DISP (BLADE) ×4 IMPLANT
BLADE MINI RND TIP GREEN BEAV (BLADE) ×4 IMPLANT
BNDG CMPR 9X4 STRL LF SNTH (GAUZE/BANDAGES/DRESSINGS) ×2
BNDG COHESIVE 1X5 TAN STRL LF (GAUZE/BANDAGES/DRESSINGS) ×4 IMPLANT
BNDG COHESIVE 4X5 TAN STRL (GAUZE/BANDAGES/DRESSINGS) ×4 IMPLANT
BNDG CONFORM 2 STRL LF (GAUZE/BANDAGES/DRESSINGS) ×4 IMPLANT
BNDG ESMARK 4X9 LF (GAUZE/BANDAGES/DRESSINGS) ×2 IMPLANT
BNDG GAUZE 1X2.1 STRL (MISCELLANEOUS) IMPLANT
BNDG GAUZE ELAST 4 BULKY (GAUZE/BANDAGES/DRESSINGS) ×8 IMPLANT
CHLORAPREP W/TINT 26ML (MISCELLANEOUS) ×4 IMPLANT
CORDS BIPOLAR (ELECTRODE) ×2 IMPLANT
COVER MAYO STAND STRL (DRAPES) ×4 IMPLANT
COVER TABLE BACK 60X90 (DRAPES) ×4 IMPLANT
CUFF TOURNIQUET SINGLE 18IN (TOURNIQUET CUFF) IMPLANT
DRAIN PENROSE 1/2X12 LTX STRL (WOUND CARE) IMPLANT
DRAPE EXTREMITY T 121X128X90 (DRAPE) ×4 IMPLANT
DRAPE SURG 17X23 STRL (DRAPES) ×4 IMPLANT
DRSG EMULSION OIL 3X3 NADH (GAUZE/BANDAGES/DRESSINGS) ×4 IMPLANT
GAUZE SPONGE 4X4 12PLY STRL (GAUZE/BANDAGES/DRESSINGS) ×4 IMPLANT
GLOVE BIO SURGEON STRL SZ7.5 (GLOVE) ×4 IMPLANT
GLOVE BIOGEL PI IND STRL 8 (GLOVE) ×2 IMPLANT
GLOVE BIOGEL PI INDICATOR 8 (GLOVE) ×2
GOWN STRL REUS W/ TWL LRG LVL3 (GOWN DISPOSABLE) ×4 IMPLANT
GOWN STRL REUS W/TWL LRG LVL3 (GOWN DISPOSABLE) ×8
NDL HYPO 25X1 1.5 SAFETY (NEEDLE) IMPLANT
NDL SAFETY ECLIPSE 18X1.5 (NEEDLE) IMPLANT
NEEDLE HYPO 18GX1.5 SHARP (NEEDLE)
NEEDLE HYPO 25X1 1.5 SAFETY (NEEDLE) ×4 IMPLANT
NS IRRIG 1000ML POUR BTL (IV SOLUTION) ×4 IMPLANT
PACK BASIN DAY SURGERY FS (CUSTOM PROCEDURE TRAY) ×4 IMPLANT
PADDING CAST ABS 4INX4YD NS (CAST SUPPLIES)
PADDING CAST ABS COTTON 4X4 ST (CAST SUPPLIES) IMPLANT
RUBBERBAND STERILE (MISCELLANEOUS) IMPLANT
SPONGE GAUZE 4X4 12PLY STER LF (GAUZE/BANDAGES/DRESSINGS) ×4 IMPLANT
STOCKINETTE 4X48 STRL (DRAPES) ×4 IMPLANT
SUT CHROMIC 7 0 TG140 8 (SUTURE) ×2 IMPLANT
SUT VICRYL RAPIDE 4-0 (SUTURE) IMPLANT
SUT VICRYL RAPIDE 4/0 PS 2 (SUTURE) ×2 IMPLANT
SYR BULB 3OZ (MISCELLANEOUS) ×4 IMPLANT
SYRINGE 10CC LL (SYRINGE) IMPLANT
TOWEL OR 17X24 6PK STRL BLUE (TOWEL DISPOSABLE) ×4 IMPLANT
TOWEL OR NON WOVEN STRL DISP B (DISPOSABLE) ×4 IMPLANT
UNDERPAD 30X30 INCONTINENT (UNDERPADS AND DIAPERS) ×4 IMPLANT

## 2013-06-27 NOTE — Anesthesia Procedure Notes (Signed)
Procedure Name: MAC Date/Time: 06/27/2013 7:40 AM Performed by: Taren Toops Pre-anesthesia Checklist: Patient identified, Emergency Drugs available, Suction available, Patient being monitored and Timeout performed Patient Re-evaluated:Patient Re-evaluated prior to inductionOxygen Delivery Method: Simple face mask

## 2013-06-27 NOTE — Anesthesia Postprocedure Evaluation (Signed)
Anesthesia Post Note  Patient: [name]  Procedure(s) Performed: Procedure(s) (LRB): LEFT THUMB TUMOR EXCISION  (Left) LEFT RING FINGER TRIGGER RELEASE (Left)  Anesthesia type: MAC  Patient location: PACU  Post pain: Pain level controlled and Adequate analgesia  Post assessment: Post-op Vital signs reviewed, Patient's Cardiovascular Status Stable and Respiratory Function Stable  Last Vitals:  Filed Vitals:   06/27/13 0845  BP:   Pulse: 65  Temp:   Resp: 16    Post vital signs: Reviewed and stable  Level of consciousness: awake, alert  and oriented  Complications: No apparent anesthesia complications

## 2013-06-27 NOTE — Interval H&P Note (Signed)
History and Physical Interval Note:  06/27/2013 7:34 AM  [name]  has presented today for surgery, with the diagnosis of LEFT THUMB SUBURGUAL TUMOR AND RIGHT RING FINGER TRIGGER FINGER  The various methods of treatment have been discussed with the patient and family. After consideration of risks, benefits and other options for treatment, the patient has consented to  Procedure(s): LEFT THUMB TUMOR EXCISION  (Left) LEFT RING FINGER TRIGGER RELEASE (Left) as a surgical intervention .  The patient's history has been reviewed, patient examined, no change in status, stable for surgery.  I have reviewed the patient's chart and labs.  Questions were answered to the patient's satisfaction.     Jolyn Nap

## 2013-06-27 NOTE — Anesthesia Preprocedure Evaluation (Addendum)
Anesthesia Evaluation  Patient identified by MRN, date of birth, ID band Patient awake and Patient unresponsive    Airway Mallampati: II  Neck ROM: full    Dental   Pulmonary asthma , sleep apnea , COPDformer smoker,          Cardiovascular hypertension, Pt. on medications and Pt. on home beta blockers + Peripheral Vascular Disease     Neuro/Psych  Headaches, PSYCHIATRIC DISORDERS Depression  Neuromuscular disease    GI/Hepatic GERD-  Medicated,  Endo/Other  diabetes, Type 2, Insulin DependentHypothyroidism Hyperthyroidism   Renal/GU Renal InsufficiencyRenal disease     Musculoskeletal   Abdominal   Peds  Hematology  (+) anemia ,   Anesthesia Other Findings   Reproductive/Obstetrics                          Anesthesia Physical Anesthesia Plan  ASA: III  Anesthesia Plan: General   Post-op Pain Management:    Induction: Intravenous  Airway Management Planned: LMA  Additional Equipment:   Intra-op Plan:   Post-operative Plan:   Informed Consent: I have reviewed the patients History and Physical, chart, labs and discussed the procedure including the risks, benefits and alternatives for the proposed anesthesia with the patient or authorized representative who has indicated his/her understanding and acceptance.     Plan Discussed with: CRNA, Anesthesiologist and Surgeon  Anesthesia Plan Comments:         Anesthesia Quick Evaluation

## 2013-06-27 NOTE — Discharge Instructions (Addendum)
Discharge Instructions   You have a light dressing on your hand.  You may begin gentle motion of your fingers and hand immediately, but you should not do any heavy lifting or gripping.  Elevate your hand to reduce pain & swelling of the digits.  Ice over the operative site may be helpful to reduce pain & swelling.  DO NOT USE HEAT. Pain medicine has been prescribed for you.  Use your medicine as needed over the first 48 hours, and then you can begin to taper your use. You may use Tylenol in place of your prescribed pain medication, but not IN ADDITION to it. Leave the dressing in place until the third day after your surgery and then remove it, leaving it open to air.  After the bandage has been removed you may shower, but do not soak the incision.  You may drive a car when you are off of prescription pain medications and can safely control your vehicle with both hands. We will address whether therapy will be required or not when you return to the office. You may have already made your follow-up appointment when we completed your preop visit.  If not, please call our office today or the next business day to make your return appointment for 7-10 days after surgery.   Please call [phone] during normal business hours or [phone] after hours for any problems. Including the following:  - excessive redness of the incisions - drainage for more than 4 days - fever of more than 101.5 F  *Please note that pain medications will not be refilled after hours or on weekends.    Post Anesthesia Home Care Instructions  Activity: Get plenty of rest for the remainder of the day. A responsible adult should stay with you for 24 hours following the procedure.  For the next 24 hours, DO NOT: -Drive a car -Paediatric nurse -Drink alcoholic beverages -Take any medication unless instructed by your physician -Make any legal decisions or sign important papers.  Meals: Start with liquid foods such as  gelatin or soup. Progress to regular foods as tolerated. Avoid greasy, spicy, heavy foods. If nausea and/or vomiting occur, drink only clear liquids until the nausea and/or vomiting subsides. Call your physician if vomiting continues.  Special Instructions/Symptoms: Your throat may feel dry or sore from the anesthesia or the breathing tube placed in your throat during surgery. If this causes discomfort, gargle with warm salt water. The discomfort should disappear within 24 hours.

## 2013-06-27 NOTE — Transfer of Care (Signed)
Immediate Anesthesia Transfer of Care Note  Patient: [name]  Procedure(s) Performed: Procedure(s): LEFT THUMB TUMOR EXCISION  (Left) LEFT RING FINGER TRIGGER RELEASE (Left)  Patient Location: PACU  Anesthesia Type:MAC  Level of Consciousness: awake, alert , oriented and patient cooperative  Airway & Oxygen Therapy: Patient Spontanous Breathing and Patient connected to face mask oxygen  Post-op Assessment: Report given to PACU RN and Post -op Vital signs reviewed and stable  Post vital signs: Reviewed and stable  Complications: No apparent anesthesia complications

## 2013-06-27 NOTE — Op Note (Signed)
06/27/2013  7:34 AM  PATIENT:  [name]  80 y.o. 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 Grandville Silos, MD  PHYSICIAN ASSISTANT: None  ANESTHESIA:  local and MAC  SPECIMENS:  Mass of left thumb to pathology  DRAINS:   None  PREOPERATIVE INDICATIONS:  [name] is a  80 y.o. female with a three-year history of a lesion in the left thumb, painful with pressure on the nail bed as well as exposure to cold. She was unable to undergo MRI scan do to deal implants, so discussion was held regarding the indications for exploration and she consented.  The risks benefits and alternatives were discussed with the patient preoperatively including but not limited to the risks of infection, bleeding, nerve injury, cardiopulmonary complications, the need for revision surgery, among others, and the patient verbalized understanding and consented to proceed.  OPERATIVE IMPLANTS: None  OPERATIVE PROCEDURE:  After receiving prophylactic antibiotics, the patient was escorted to the operative theatre and placed in a supine position. She was lightly sedated. Digital blocks were performed by me for the thumb and the field block plan trigger release incision. A surgical "time-out" was performed during which the planned procedure, proposed operative site, and the correct patient identity were compared to the operative consent and agreement confirmed by the circulating nurse according to current facility policy.  Following application of a tourniquet to the operative extremity, the exposed skin was prepped with Chloraprep and draped in the usual sterile fashion.  The limb was exsanguinated with an Esmarch bandage and the tourniquet  inflated to approximately 122mmHg higher than systolic BP.  The trigger release was addressed first, making an oblique incision over the A1 pulley of the ring finger. The neurovascular bundles were retracted radially and ulnarly and the A1 pulley was incised. The tendons were brought into view, though some thickened tenosynovium and longitudinal fraying and this was debrided. There were returned to her bed and attention directed to the thumb was nail plate was removed. Was a dark spot visible in the central portion of the terminal matrix. It could be accessed without incising the eponychial fold. A foldable simply retracted proximally and a longitudinal incision made over the discolored area. The sterile matrix of the nailbed was then reflected radially and ulnarly to expose the underlying tumor. 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 It was largely excised and passed off. The wound is irrigated and the nail bed repaired with 7-0 chromic suture. Tourniquet was released and additional hemostasis obtained and the trigger release incision was closed with 4-0 Vicryl Rapide interrupted sutures. Light dressings were applied and she was taken to room stable condition  DISPOSITION: She'll be discharged home today return in 7-10 days for reevaluation.

## 2013-06-29 ENCOUNTER — Encounter (HOSPITAL_BASED_OUTPATIENT_CLINIC_OR_DEPARTMENT_OTHER): Payer: Self-pay | Admitting: Orthopedic Surgery

## 2013-07-01 ENCOUNTER — Telehealth: Payer: Self-pay | Admitting: *Deleted

## 2013-07-01 MED ORDER — METOPROLOL SUCCINATE ER 100 MG PO TB24: 100.0000 mg | ORAL_TABLET | Freq: Every day | ORAL | Status: DC

## 2013-07-04 ENCOUNTER — Ambulatory Visit: Payer: Medicare Other | Admitting: Podiatry

## 2013-07-04 NOTE — Telephone Encounter (Addendum)
Pt spoke with [facility] today about metoprolol refill; pt was told that [facility] not [facility] any more and request a substitute [facility].Please advise. Pt request refill on miralax and [facility]antus to [facility]. Pt said she usually gets 9 vials of Lantus each time from the mail order pharmacy. Advised pt if she is taking lantus 20 units a day and there is 100 units/ml a vial of lantus should last 50 days; pt said I was wrong. When I tried to explain further pt was getting upset and said she wants Dr Damita Dunnings to decide how much lantus she is to get. Apologized to pt for upsetting her and advised pt I would sent the note to Dr Damita Dunnings. Pt request cb when refilled.

## 2013-07-04 NOTE — Telephone Encounter (Signed)
[facility] faxed clarification request for metoprolol which is in your in box.

## 2013-07-04 NOTE — Addendum Note (Signed)
Addended by: Helene Shoe on: 07/04/2013 03:32 PM   Modules accepted: Orders

## 2013-07-05 ENCOUNTER — Other Ambulatory Visit: Payer: Self-pay | Admitting: *Deleted

## 2013-07-05 MED ORDER — METOPROLOL SUCCINATE ER 100 MG PO TB24: 100.0000 mg | ORAL_TABLET | Freq: Every day | ORAL | Status: DC

## 2013-07-05 MED ORDER — INSULIN GLARGINE 100 UNIT/ML ~~LOC~~ SOLN: 20.0000 [IU] | Freq: Every day | SUBCUTANEOUS | Status: DC

## 2013-07-05 MED ORDER — METOPROLOL SUCCINATE ER 100 MG PO TB24
100.0000 mg | ORAL_TABLET | Freq: Every day | ORAL | Status: DC
Start: 2013-07-05 — End: 2013-12-16

## 2013-07-05 MED ORDER — POLYETHYLENE GLYCOL 3350 17 GM/SCOOP PO POWD: 17.0000 g | Freq: Every day | ORAL | Status: DC

## 2013-07-05 NOTE — Telephone Encounter (Signed)
[facility] day supply.  She is completely out. 30 day supply called into [facility] in [place]

## 2013-07-05 NOTE — Telephone Encounter (Signed)
.  left message to have patient return my call.  

## 2013-07-05 NOTE — Addendum Note (Signed)
Addended by: Tonia Ghent on: 07/05/2013 08:27 AM   Modules accepted: Orders

## 2013-07-05 NOTE — Telephone Encounter (Signed)
20 units per day --> 50 days.  That is correct but you usually don't use the vial for more than 30 days.   3 vials at a time should be enough for 3 months.  rxs for lantus and mirlax sent.   Metoprolol form is done.  Thanks.

## 2013-07-05 NOTE — Telephone Encounter (Signed)
Requested Prescriptions   Signed Prescriptions Disp Refills  . metoprolol succinate (TOPROL-XL) 100 MG 24 hr tablet 30 tablet 1    Sig: Take 1 tablet (100 mg total) by mouth daily. Take with or immediately following a meal.    Authorizing Provider: Minna Merritts    Ordering User: Britt Bottom

## 2013-07-06 NOTE — Telephone Encounter (Signed)
Spoke with patient and advised results Form faxed

## 2013-07-08 ENCOUNTER — Telehealth: Payer: Self-pay | Admitting: *Deleted

## 2013-07-11 ENCOUNTER — Telehealth: Payer: Self-pay

## 2013-07-11 ENCOUNTER — Encounter: Payer: Self-pay | Admitting: Cardiovascular Disease

## 2013-07-11 ENCOUNTER — Ambulatory Visit (INDEPENDENT_AMBULATORY_CARE_PROVIDER_SITE_OTHER): Payer: Medicare Other | Admitting: Cardiovascular Disease

## 2013-07-11 VITALS — BP 140/68 | HR 64 | Ht 64.0 in | Wt 194.2 lb

## 2013-07-11 DIAGNOSIS — I6529 Occlusion and stenosis of unspecified carotid artery: Secondary | ICD-10-CM

## 2013-07-11 DIAGNOSIS — E119 Type 2 diabetes mellitus without complications: Secondary | ICD-10-CM

## 2013-07-11 DIAGNOSIS — E785 Hyperlipidemia, unspecified: Secondary | ICD-10-CM

## 2013-07-11 DIAGNOSIS — I10 Essential (primary) hypertension: Secondary | ICD-10-CM

## 2013-07-11 DIAGNOSIS — I4891 Unspecified atrial fibrillation: Secondary | ICD-10-CM

## 2013-07-11 DIAGNOSIS — J40 Bronchitis, not specified as acute or chronic: Secondary | ICD-10-CM

## 2013-07-11 MED ORDER — AZITHROMYCIN 250 MG PO TABS: ORAL_TABLET | ORAL | Status: DC

## 2013-07-11 MED ORDER — ATORVASTATIN CALCIUM 10 MG PO TABS: 10.0000 mg | ORAL_TABLET | Freq: Every day | ORAL | Status: DC

## 2013-07-11 NOTE — Assessment & Plan Note (Addendum)
Cholesterol is at goal on the current lipid regimen. No changes to the medications were made.  

## 2013-07-11 NOTE — Telephone Encounter (Signed)
Spoke w/ [name].  She reports that there is a contraindication w/ zpak and digoxin.  Advised her that our computer system alerts [place] to this when med is prescribed and that Dr. Rockey Situ is aware.  She states that she will try to get this approved thru pt's ins.

## 2013-07-11 NOTE — Telephone Encounter (Signed)
[facility] called and has question regarding rx Zpak. please call

## 2013-07-11 NOTE — Progress Notes (Signed)
Patient ID: [name], female    DOB: 04-15-1931, 80 y.o.   MRN: [id number]  HPI Comments: [name]  is a 80 year old woman with chronic atrial fibrillation, long smoking history, history of subdural hematoma while on warfarin, chronic renal insufficiency on diuretics with history of spinal cord injury and neurogenic bladder, peripheral vascular disease with 40-59% left internal carotid arterial disease  who presents for routine followup.  H/o GI stricture  In followup today, she reports having several weeks of significant chest congestion. She has cough with productive sputum. Nasal congestion somewhat. She feels that she has bronchitis. Otherwise she has been relatively active, limited in her mobility, uses a scooter   in the hospital January 2014 with asthma attack. Initially started on diuresis with worsening renal failure Creatinine improved back to baseline by the end of her admission   Stop smoking many years ago. She has chronic back and leg pain.  She denies any significant chest pain.   Previously to the emergency room for mental status changes. Evaluated in the ER 01/06/2013. CT scan of the head showed no stroke. She does report losing vision in her left eye over the course of the past year. She has been evaluated by ophthalmology. She denies stroke as a cause of her vision loss. We had a long discussion with her again about anticoagulation. She does not want to change any of her medications, does not want aspirin, Plavix or warfarin.  Echocardiogram 02/27/2012 showing normal ejection fraction greater than 55%, moderately dilated left atrium, moderately elevated right ventricular systolic pressure 09-73 mm of mercury EKG shows atrial fibrillation with ventricular rate 75 beats per minute, no significant ST or T wave changes  EKG shows atrial fibrillation with rate 64 beats per minute, poor R-wave progression through the anterior precordial leads      Outpatient Encounter  Prescriptions as of 07/11/2013  Medication Sig  . acetaminophen (TYLENOL) 650 MG CR tablet Take 650 mg by mouth 2 (two) times daily.   . calcitRIOL (ROCALTROL) 0.5 MCG capsule Take 1 capsule (0.5 mcg total) by mouth daily.  . digoxin (LANOXIN) 0.125 MG tablet Take 0.125 mg by mouth daily.  . DULoxetine (CYMBALTA) 30 MG capsule Take 1 capsule (30 mg total) by mouth daily.  . fluticasone (FLONASE) 50 MCG/ACT nasal spray Place 2 sprays into both nostrils daily.  . Fluticasone-Salmeterol (ADVAIR) 500-50 MCG/DOSE AEPB Inhale 1 puff into the lungs 2 (two) times daily.  . furosemide (LASIX) 40 MG tablet Take 1 tablet (40 mg total) by mouth daily.  . insulin glargine (LANTUS) 100 UNIT/ML injection Inject 0.2 mLs (20 Units total) into the skin at bedtime.  Marland Kitchen ipratropium (ATROVENT HFA) 17 MCG/ACT inhaler Inhale 2 puffs into the lungs 2 (two) times daily.  Marland Kitchen levothyroxine (SYNTHROID, LEVOTHROID) 88 MCG tablet Take 1 tablet (88 mcg total) by mouth daily before breakfast.  . lisinopril (PRINIVIL,ZESTRIL) 20 MG tablet Take 1 tablet (20 mg total) by mouth 2 (two) times daily.  . metoprolol succinate (TOPROL-XL) 100 MG 24 hr tablet Take 1 tablet (100 mg total) by mouth daily. Take with or immediately following a meal.  . omeprazole (PRILOSEC) 20 MG capsule TAKE 1 CAPSULE 45 MINUTES PRIOR TO BREAKFAST  . polyethylene glycol powder (GLYCOLAX/MIRALAX) powder Take 17 g by mouth daily.  . pregabalin (LYRICA) 75 MG capsule Take 1 capsule (75 mg total) by mouth 2 (two) times daily.  . traMADol (ULTRAM) 50 MG tablet Take 1-2 tablets (50-100 mg total) by mouth  3 (three) times daily as needed for severe pain (max 6 per day).    Review of Systems  Constitutional: Negative.   HENT: Negative.   Eyes: Negative.   Respiratory: Negative.   Cardiovascular: Negative.   Gastrointestinal: Negative.   Endocrine: Negative.   Musculoskeletal: Positive for back pain and gait problem.  Skin: Negative.   Allergic/Immunologic:  Negative.   Neurological: Negative.   Hematological: Negative.   Psychiatric/Behavioral: Negative.   All other systems reviewed and are negative.   BP 140/68  Pulse 64  Ht 5\' 4"  (1.626 m)  Wt 194 lb 4 oz (88.111 kg)  BMI 33.33 kg/m2  Physical Exam  Nursing note and vitals reviewed. Constitutional: She is oriented to person, place, and time. She appears well-developed and well-nourished.  Presents in a wheelchair. Obese.  HENT:  Head: Normocephalic.  Nose: Nose normal.  Mouth/Throat: Oropharynx is clear and moist.  Eyes: Conjunctivae are normal. Pupils are equal, round, and reactive to light.  Neck: Normal range of motion. Neck supple. No JVD present. Carotid bruit is present.  Cardiovascular: Normal rate, S1 normal, S2 normal and intact distal pulses.  An irregularly irregular rhythm present. Exam reveals no gallop and no friction rub.   Murmur heard.  Crescendo systolic murmur is present with a grade of 2/6  Trace bilateral leg edema  Pulmonary/Chest: Effort normal and breath sounds normal. No respiratory distress. She has no wheezes. She has no rales. She exhibits no tenderness.  Abdominal: Soft. Bowel sounds are normal. She exhibits no distension. There is no tenderness.  Musculoskeletal: Normal range of motion. She exhibits no edema and no tenderness.  Lymphadenopathy:    She has no cervical adenopathy.  Neurological: She is alert and oriented to person, place, and time. Coordination normal.  Skin: Skin is warm and dry. No rash noted. No erythema.  Psychiatric: She has a normal mood and affect. Her behavior is normal. Judgment and thought content normal.    Assessment and Plan

## 2013-07-11 NOTE — Assessment & Plan Note (Signed)
Blood pressure is well controlled on today's visit. No changes made to the medications. 

## 2013-07-11 NOTE — Assessment & Plan Note (Signed)
Heart rate relatively well-controlled on her current medications. Tolerating anticoagulation

## 2013-07-11 NOTE — Assessment & Plan Note (Signed)
Diabetes numbers well-controlled. Encouraged her to continue a strict diet

## 2013-07-11 NOTE — Patient Instructions (Signed)
You are doing well.  Take Zpak two pills the first day then one a day Try zyrtec as needed  Please call us if you have new issues that need to be addressed before your next appt.  Your physician wants you to follow-up in: 6 months.  You will receive a reminder letter in the mail two months in advance. If you don't receive a letter, please call our office to schedule the follow-up appointment.

## 2013-07-11 NOTE — Assessment & Plan Note (Signed)
Several weeks of productive cough, continued sputum. We have prescribed Z-Pak for her for possible bronchitis. She has allergy to Levaquin

## 2013-07-16 ENCOUNTER — Other Ambulatory Visit: Payer: Self-pay | Admitting: Family Medicine

## 2013-07-29 ENCOUNTER — Encounter: Payer: Self-pay | Admitting: Internal Medicine

## 2013-07-29 ENCOUNTER — Ambulatory Visit (INDEPENDENT_AMBULATORY_CARE_PROVIDER_SITE_OTHER): Payer: Medicare Other | Admitting: Internal Medicine

## 2013-07-29 VITALS — BP 138/82 | HR 64 | Temp 98.2°F | Resp 12 | Wt 193.8 lb

## 2013-07-29 DIAGNOSIS — E119 Type 2 diabetes mellitus without complications: Secondary | ICD-10-CM

## 2013-07-29 DIAGNOSIS — I6529 Occlusion and stenosis of unspecified carotid artery: Secondary | ICD-10-CM

## 2013-07-29 LAB — HEMOGLOBIN A1C: Hgb A1c MFr Bld: 5.6 % (ref 4.6–6.5)

## 2013-07-29 NOTE — Patient Instructions (Signed)
Please continue with only 20 units Lantus at bedtime. Do not use the 25 units unless sugars are consistently higher than 150 in am. Please come back for a follow-up appointment in 3 months

## 2013-07-29 NOTE — Progress Notes (Signed)
Subjective:     Patient ID: [name], female   DOB: [birth year], 80 y.o.   MRN: [id number]  HPI Ms Blomgren is a very pleasant 80 y.o. woman, returning after a long absence for f/u for DM2, dx 1998, insulin-dependent since 1991, controlled, with complications (CKD stage 4 - last GFR 40, peripheral neuropathy, PVD), in the context of continuous decreasing insulin requirement and h/o severe hypoglycemia episodes. She is here accompanied by her niece. Last visit 16 mo ago!  She was in the hospital several times this past year. This was mostly 2/2 side effects from pain medicines for her back pain   Last HbA1C: Lab Results  Component Value Date   HGBA1C 5.6 04/25/2013  Previous HbA1C was 7.2%, but before this, 5.3-6%.   She brings a very detailed, exemplary-kept, CBG log, in which she documents sugars checked at least 6x a day: before and 2h after each meal.  - before meals: 85-130, mostly 100-120 >> 98-145 - after meals: 140-220, mostly ~180 >> 110-154 (234)   She is on: - Lantus 20-25 units qhs. She uses the Lantus but because of arthritis, she had to switch from the pen to the syringes.  She is off Humalog SSI.  She has a h/o thyroidectomy and is on replacement. Her last TSH was normal: Lab Results  Component Value Date   TSH 0.63 12/28/2012   Has CKD. Reviewed BUN/Cr:  BUN  Date Value Ref Range Status  06/27/2013 35* 6 - 23 mg/dL Final  04/25/2013 39* 6 - 23 mg/dL Final  01/06/2013 16  6 - 23 mg/dL Final  12/28/2012 17  6 - 23 mg/dL Final  07/28/2011 25* 6 - 23 mg/dL Final  12/24/2010 28* 6 - 23 mg/dL Final   Creatinine, Ser  Date Value Ref Range Status  06/27/2013 1.20* 0.50 - 1.10 mg/dL Final  04/25/2013 1.1  0.4 - 1.2 mg/dL Final  01/06/2013 1.11* 0.50 - 1.10 mg/dL Final  12/28/2012 1.2  0.4 - 1.2 mg/dL Final  07/28/2011 1.3* 0.4 - 1.2 mg/dL Final  12/24/2010 1.30* 0.50 - 1.10 mg/dL Final   She sees her ophthalmologist yearly  >> last exam 06/2013> >> lost vision L eye >> macular  degeneration. She has numbness and tingling in hands and feet.   I reviewed pt's medications, allergies, PMH, social hx, family hx and no changes required, except as mentioned above.  Review of Systems Constitutional: + weight loss, + decreased appetiteno fatigue, no subjective hyperthermia/hypothermia, + excessive urination Eyes: + blurry vision, no xerophthalmia ENT: no sore throat, no nodules palpated in throat, no dysphagia/odynophagia, no hoarseness Cardiovascular: no CP/SOB/palpitations/leg swelling Respiratory: no cough/SOB Gastrointestinal: no N/V/D/C Musculoskeletal: no muscle/joint aches Skin: no rashes Neurological: no tremors/numbness/tingling/dizziness + poor sleep  Objective:   Physical Exam BP 138/82  Pulse 64  Temp(Src) 98.2 F (36.8 C) (Oral)  Resp 12  Wt 193 lb 12.8 oz (87.907 kg)  SpO2 96% Wt Readings from Last 3 Encounters:  07/29/13 193 lb 12.8 oz (87.907 kg)  07/11/13 194 lb 4 oz (88.111 kg)  06/27/13 195 lb (88.451 kg)   Constitutional: overweight, in NAD, in wheelchair Eyes: PERRLA, EOMI, no exophthalmos ENT: very dry mucous membranes, no thyromegaly, no cervical lymphadenopathy Cardiovascular: irregularly irregular rhythm, No MRG, arms and leg edema Respiratory: CTA B Gastrointestinal: abdomen soft, NT, ND, BS+ Musculoskeletal: no deformities, strength intact in all 4 Skin: moist, warm, no rashes Neurological: no tremor with outstretched hands, DTR normal in all 4  Assessment:  1. DM2,insulin-dependent, controlled, with complications - CKD stage 4 - peripheral neuropathy - PVD    Plan:     1. Pt has DM2 with exemplary control on only Lantus. We reviewed her sugar log, which documents sugars before and after b'fast and dinner >> vast majority of sugars b/w 90-150. No lows. Very few spikes. - she alternates between Lantus 20 and 25 units >> advised her to stay only on the 20 units dose - will check HbA1c today - RTC in 3 mo  Office  Visit on 07/29/2013  Component Date Value Ref Range Status  . Hemoglobin A1C 07/29/2013 5.6  4.6 - 6.5 % Final   Glycemic Control Guidelines for People with Diabetes:Non Diabetic:  <6%Goal of Therapy: <7%Additional Action Suggested:  >8%   The A1c is great!

## 2013-07-31 ENCOUNTER — Other Ambulatory Visit: Payer: Self-pay | Admitting: Family Medicine

## 2013-08-01 NOTE — Telephone Encounter (Signed)
Electronic refill request. Last Filled:   30 capsule 0 RF on   03/01/2013.  ? If patient has been taking this as prescribed according to RF date.  Please advise.

## 2013-08-02 NOTE — Telephone Encounter (Signed)
1)Patient doesn't know who prescribed it but is currently taking it.  She says she may have not needed the refill since January because of being in the hospital for a while.  Please advise.  2)Patient also says she thought she was supposed to get a B-12 level done and have it done with other labs with another physician but they said they couldn't do it and to contact you.  Please advise.

## 2013-08-02 NOTE — Telephone Encounter (Signed)
Thanks.  rx sent.   B12 order is in, lab visit whenever feasible.  Thanks.

## 2013-08-02 NOTE — Telephone Encounter (Signed)
It doesn't appear that she is due for anything else at this point.  I would only get the B12.  Thanks.

## 2013-08-02 NOTE — Telephone Encounter (Signed)
Please clarify.  I thought this originally came through another clinic, ie renal.  Thanks.

## 2013-08-02 NOTE — Telephone Encounter (Signed)
Patient is now asking if she is supposed to have a Calcitrol level also?  Please check for any other labs that might be due and add them to the order.  I will then print the order and mail it to the patient so that she can get it done at the [facility].

## 2013-08-04 NOTE — Telephone Encounter (Signed)
Order printed and mailed to patient

## 2013-08-05 ENCOUNTER — Other Ambulatory Visit: Payer: Self-pay | Admitting: Family Medicine

## 2013-08-05 DIAGNOSIS — E538 Deficiency of other specified B group vitamins: Secondary | ICD-10-CM

## 2013-08-23 ENCOUNTER — Encounter: Payer: Self-pay | Admitting: Family Medicine

## 2013-08-24 ENCOUNTER — Other Ambulatory Visit: Payer: Self-pay | Admitting: Family Medicine

## 2013-08-24 MED ORDER — VITAMIN B-12 1000 MCG PO TABS: 1000.0000 ug | ORAL_TABLET | Freq: Every day | ORAL | Status: DC

## 2013-08-30 ENCOUNTER — Other Ambulatory Visit: Payer: Self-pay | Admitting: *Deleted

## 2013-08-30 NOTE — Telephone Encounter (Signed)
I have received a faxed refill request for Ventolin HFA 18GM W/COUNT 90MCG.  I don't see this on her current meds list nor historical meds list and I don't even see it in EPIC to order.  Please advise.  Hard copy is in your In Box if you'd like to see it.

## 2013-08-30 NOTE — Telephone Encounter (Signed)
Please verify this with patient.  I don't see it either.  Thanks.

## 2013-08-31 NOTE — Telephone Encounter (Signed)
Spoke to patient and was advised that she requested the pharmacy to see about getting her a new script for Symbicort Inhaler 160-4.5, use 2 puffs twice daily. Patient stated that she is unable to use the Advair because of the arthritis in her hands. Is it okay to fill?

## 2013-09-01 MED ORDER — BUDESONIDE-FORMOTEROL FUMARATE 160-4.5 MCG/ACT IN AERO: 2.0000 | INHALATION_SPRAY | Freq: Two times a day (BID) | RESPIRATORY_TRACT | Status: AC

## 2013-09-01 MED ORDER — ALBUTEROL SULFATE (2.5 MG/3ML) 0.083% IN NEBU: 2.5000 mg | INHALATION_SOLUTION | Freq: Four times a day (QID) | RESPIRATORY_TRACT | Status: DC | PRN

## 2013-09-01 NOTE — Telephone Encounter (Signed)
Okay to sent the symbicort. Verify the pharmacy and send, okay to send 3 inhalers at a time with 3 rf.  Does she need the albuterol rx sent?  Thanks.

## 2013-09-01 NOTE — Telephone Encounter (Signed)
Medication sent to pharmacy  

## 2013-09-05 ENCOUNTER — Other Ambulatory Visit: Payer: Self-pay | Admitting: Family Medicine

## 2013-09-05 ENCOUNTER — Telehealth: Payer: Self-pay

## 2013-09-05 MED ORDER — FLUTICASONE PROPIONATE 50 MCG/ACT NA SUSP: 2.0000 | Freq: Every day | NASAL | Status: AC

## 2013-09-05 NOTE — Telephone Encounter (Signed)
Electronic refill request.  Does the patient need 4 syringes per day as requested?  Please advise.

## 2013-09-05 NOTE — Telephone Encounter (Signed)
Ivin Booty with [facility] request A1C results 90 days prior or after 03/04/13 to meet medicare guidelines; advised 04/25/13 A1C was 5.6.

## 2013-09-06 NOTE — Telephone Encounter (Signed)
Verify with patient.  Should only need 1 a day.  The requested number is also atypical.  Let me know.  Thanks.

## 2013-09-29 ENCOUNTER — Other Ambulatory Visit: Payer: Self-pay | Admitting: Family Medicine

## 2013-09-30 ENCOUNTER — Other Ambulatory Visit: Payer: Self-pay | Admitting: Family Medicine

## 2013-09-30 NOTE — Telephone Encounter (Signed)
Sent. Thanks.   

## 2013-09-30 NOTE — Telephone Encounter (Signed)
Electronic refill request. Last Filled:   01/06/13 by Dr. Spero Curb at [facility].  Please advise.

## 2013-10-03 ENCOUNTER — Ambulatory Visit (INDEPENDENT_AMBULATORY_CARE_PROVIDER_SITE_OTHER): Payer: Medicare Other | Admitting: Podiatry

## 2013-10-03 DIAGNOSIS — M79676 Pain in unspecified toe(s): Secondary | ICD-10-CM

## 2013-10-03 DIAGNOSIS — B351 Tinea unguium: Secondary | ICD-10-CM

## 2013-10-03 DIAGNOSIS — M79609 Pain in unspecified limb: Secondary | ICD-10-CM

## 2013-10-03 NOTE — Progress Notes (Signed)
She presents today for chief complaint of painful elongated toenails. ° °Objective: Nails are thick yellow dystrophic with mycotic painful palpation. ° °Assessment: Pain in limb secondary to onychomycosis 1 through 5 bilateral. ° °Plan: Debridement of nails thickness and length as cover service secondary to pain. °

## 2013-10-28 ENCOUNTER — Ambulatory Visit (INDEPENDENT_AMBULATORY_CARE_PROVIDER_SITE_OTHER): Payer: Medicare Other | Admitting: Internal Medicine

## 2013-10-28 ENCOUNTER — Encounter: Payer: Self-pay | Admitting: Internal Medicine

## 2013-10-28 VITALS — BP 118/68 | HR 59 | Temp 98.3°F | Resp 12

## 2013-10-28 DIAGNOSIS — E119 Type 2 diabetes mellitus without complications: Secondary | ICD-10-CM

## 2013-10-28 DIAGNOSIS — I6529 Occlusion and stenosis of unspecified carotid artery: Secondary | ICD-10-CM

## 2013-10-28 DIAGNOSIS — R1032 Left lower quadrant pain: Secondary | ICD-10-CM

## 2013-10-28 LAB — URINALYSIS, ROUTINE W REFLEX MICROSCOPIC
Bilirubin Urine: NEGATIVE
Hgb urine dipstick: NEGATIVE
Ketones, ur: NEGATIVE
Nitrite: NEGATIVE
SPECIFIC GRAVITY, URINE: 1.01 (ref 1.000–1.030)
Total Protein, Urine: 100 — AB
UROBILINOGEN UA: 0.2 (ref 0.0–1.0)
Urine Glucose: NEGATIVE
pH: 6 (ref 5.0–8.0)

## 2013-10-28 LAB — HEMOGLOBIN A1C: HEMOGLOBIN A1C: 6.6 % — AB (ref 4.6–6.5)

## 2013-10-28 MED ORDER — NITROFURANTOIN MONOHYD MACRO 100 MG PO CAPS: 100.0000 mg | ORAL_CAPSULE | Freq: Two times a day (BID) | ORAL | Status: AC

## 2013-10-28 MED ORDER — GLIPIZIDE ER 2.5 MG PO TB24: 5.0000 mg | ORAL_TABLET | Freq: Every day | ORAL | Status: DC

## 2013-10-28 NOTE — Progress Notes (Addendum)
Subjective:     Patient ID: [name], female   DOB: 1931-11-06, 80 y.o.   MRN: [id number]  HPI Ms Africa is a very pleasant 80 y.o. woman, returning for f/u for DM2, dx 1998, insulin-dependent since 1991, controlled, with complications (CKD stage 4 - last GFR 40, peripheral neuropathy, PVD). Last visit 3 mo ago.  She feels she may have a UTI. Sugars a little higher in last 2 weeks.   Last HbA1C: Lab Results  Component Value Date   HGBA1C 5.6 07/29/2013  Previous HbA1C was 7.2%, but before this, 5.3-6%.   She brings a very detailed, exemplary-kept, CBG log, in which she documents sugars checked at least 6x a day: before and 2h after each meal.  - before b'fast: 85-130, mostly 100-120 >> 98-145 >> 117-174, most <150 - 2h after b'fast: 122-227 - before dinner: 107-169 - 2h after dinner: 140-220, mostly ~180 >> 110-154 (234) >> 127-194  She is on: - Lantus 20 units qhs. She uses the Lantus but because of arthritis, she had to switch from the pen to the syringes.  She is off Humalog SSI.  She has a h/o thyroidectomy and is on replacement. Her last TSH was normal: Lab Results  Component Value Date   TSH 0.63 12/28/2012   Has CKD. Reviewed BUN/Cr:  BUN  Date Value Ref Range Status  06/27/2013 35* 6 - 23 mg/dL Final  04/25/2013 39* 6 - 23 mg/dL Final  01/06/2013 16  6 - 23 mg/dL Final  12/28/2012 17  6 - 23 mg/dL Final  07/28/2011 25* 6 - 23 mg/dL Final  12/24/2010 28* 6 - 23 mg/dL Final   Creatinine, Ser  Date Value Ref Range Status  06/27/2013 1.20* 0.50 - 1.10 mg/dL Final  04/25/2013 1.1  0.4 - 1.2 mg/dL Final  01/06/2013 1.11* 0.50 - 1.10 mg/dL Final  12/28/2012 1.2  0.4 - 1.2 mg/dL Final  07/28/2011 1.3* 0.4 - 1.2 mg/dL Final  12/24/2010 1.30* 0.50 - 1.10 mg/dL Final   She sees her ophthalmologist yearly  >> last exam 06/2013 >> lost vision L eye >> macular degeneration. She has numbness and tingling in hands and feet.   I reviewed pt's medications, allergies, PMH, social hx,  family hx and no changes required, except as mentioned above.  Review of Systems Constitutional: no weight loss/gain, no fatigue, no subjective hyperthermia/hypothermia, + excessive urination Eyes: + blurry vision, no xerophthalmia ENT: no sore throat, no nodules palpated in throat, no dysphagia/odynophagia, no hoarseness Cardiovascular: no CP/SOB/palpitations/leg swelling Respiratory: no cough/SOB Gastrointestinal: no N/V/D/C, + AP (LLQ) Musculoskeletal: no muscle/joint aches Skin: no rashes Neurological: no tremors/numbness/tingling/dizziness  Objective:   Physical Exam BP 118/68  Pulse 59  Temp(Src) 98.3 F (36.8 C) (Oral)  Resp 12  SpO2 95%   Wt Readings from Last 3 Encounters:  07/29/13 193 lb 12.8 oz (87.907 kg)  07/11/13 194 lb 4 oz (88.111 kg)  06/27/13 195 lb (88.451 kg)   Constitutional: overweight, in NAD, in wheelchair Eyes: PERRLA, EOMI, no exophthalmos ENT: very dry mucous membranes, no thyromegaly, no cervical lymphadenopathy Cardiovascular: irregularly irregular rhythm, No MRG, arms and leg edema Respiratory: CTA B Gastrointestinal: abdomen soft, NT, ND, BS+ Musculoskeletal: no deformities, strength intact in all 4 Skin: moist, warm, no rashes  Assessment:     1. DM2,insulin-dependent, controlled, with complications - CKD stage 4 - peripheral neuropathy - PVD  2. Abdominal pain - no dysuria    Plan:     1. Pt has DM2  with good control on only Lantus, now recently a little worse (she believes she has a UTI). We reviewed her sugar log, which documents sugars before and after b'fast and dinner >> higher sugars (some 200s) after meals. No lows. - I advised her to: Patient Instructions  Please start Glipizide XL 5 mg (2 tablets of 2.5 mg) in am. Continue Lantus 20 mg in am. Please return in 3 months with your sugar log.  Please call me in 2 weeks to let me know how your sugars are and if I need to send a new prescription for 3 months for Glipizide to  your mail order pharmacy. Please stop at the lab. - will check HbA1c today - RTC in 3 mo  2. Abdominal pain - " I feel this is a bladder infection" - will check U/A Office Visit on 10/28/2013  Component Date Value Ref Range Status  . Hemoglobin A1C 10/28/2013 6.6* 4.6 - 6.5 % Final   Glycemic Control Guidelines for People with Diabetes:Non Diabetic:  <6%Goal of Therapy: <7%Additional Action Suggested:  >8%   . Color, Urine 10/28/2013 YELLOW  Yellow;Lt. Yellow Final  . APPearance 10/28/2013 SL CLOUDY* Clear Final  . Specific Gravity, Urine 10/28/2013 1.010  1.000-1.030 Final  . pH 10/28/2013 6.0  5.0 - 8.0 Final  . Total Protein, Urine 10/28/2013 100* Negative Final  . Urine Glucose 10/28/2013 NEGATIVE  Negative Final  . Ketones, ur 10/28/2013 NEGATIVE  Negative Final  . Bilirubin Urine 10/28/2013 NEGATIVE  Negative Final  . Hgb urine dipstick 10/28/2013 NEGATIVE  Negative Final  . Urobilinogen, UA 10/28/2013 0.2  0.0 - 1.0 Final  . Leukocytes, UA 10/28/2013 TRACE* Negative Final  . Nitrite 10/28/2013 NEGATIVE  Negative Final  . WBC, UA 10/28/2013 3-6/hpf* 0-2/hpf Final  . RBC / HPF 10/28/2013 0-2/hpf  0-2/hpf Final  . Squamous Epithelial / LPF 10/28/2013 Rare(0-4/hpf)  Rare(0-4/hpf) Final  . Renal Epithel, UA 10/28/2013 Rare(0-4/hpf)* None Final  . Bacteria, UA 10/28/2013 Many(>50/hpf)* None Final   HbA1c a little higher >> will continue with plan as above. She appears to have a UTI. Prev. UCx was intermediately SSTV to Macrobid >> will call in 100 mg bid x 5 days. I will advise her to see PCP if not better in 5 days.

## 2013-10-28 NOTE — Addendum Note (Signed)
Addended by: Philemon Kingdom on: 10/28/2013 06:00 PM   Modules accepted: Orders

## 2013-10-28 NOTE — Patient Instructions (Addendum)
Please start Glipizide XL 5 mg (2 tablets) in am. Continue Lantus 20 mg in am. Please return in 3 months with your sugar log.  Please call me in 2 weeks to let me know how your sugars are and if I need to send a new prescription for 3 months for Glipizide to your mail order pharmacy. Please stop at the lab.

## 2013-11-17 ENCOUNTER — Telehealth: Payer: Self-pay | Admitting: Internal Medicine

## 2013-11-17 NOTE — Telephone Encounter (Signed)
No, I did not call her recently. I called her at the beginning of the mo Re: Urine test >> we did discuss then.Marland KitchenMarland Kitchen

## 2013-11-17 NOTE — Telephone Encounter (Signed)
Please read note below and advise.  

## 2013-11-17 NOTE — Telephone Encounter (Signed)
Pt states she received a call from Dr. Cruzita Lederer and she is returning her call

## 2013-11-21 ENCOUNTER — Other Ambulatory Visit: Payer: Self-pay | Admitting: *Deleted

## 2013-11-21 ENCOUNTER — Telehealth: Payer: Self-pay | Admitting: Internal Medicine

## 2013-11-21 DIAGNOSIS — E119 Type 2 diabetes mellitus without complications: Secondary | ICD-10-CM

## 2013-11-21 MED ORDER — PREGABALIN 75 MG PO CAPS: 75.0000 mg | ORAL_CAPSULE | Freq: Two times a day (BID) | ORAL | Status: DC

## 2013-11-21 MED ORDER — TRAMADOL HCL 50 MG PO TABS: 50.0000 mg | ORAL_TABLET | Freq: Three times a day (TID) | ORAL | Status: DC | PRN

## 2013-11-21 MED ORDER — "INSULIN SYRINGE-NEEDLE U-100 30G X 1/2"" 1 ML MISC": Status: AC

## 2013-11-21 NOTE — Telephone Encounter (Signed)
Pt called requesting 90 day rx's of tramadol, lyrica, and insulin syringes [facility] I sent syringes electronically. Pt's last ov with you was in 3/15, she has a f/u with you on 11/25/13.

## 2013-11-21 NOTE — Telephone Encounter (Signed)
Both printed, please fax in.  Thanks.

## 2013-11-21 NOTE — Telephone Encounter (Signed)
Patient was advised to take glipizide and call after two weeks Patient has been trying to contact and no response  Please advise    Thank you

## 2013-11-22 NOTE — Telephone Encounter (Signed)
Written scripts faxed to [facility] as instructed.

## 2013-11-23 MED ORDER — GLIPIZIDE ER 2.5 MG PO TB24: 10.0000 mg | ORAL_TABLET | Freq: Every day | ORAL | Status: DC

## 2013-11-23 NOTE — Telephone Encounter (Signed)
This sugars are higher than before. I believe there is something else going on >> she may have a persistent UTI, may need to see PCP. For now, can increase the Glipizide to 4 TAb of 2.5 mg in am and if sugars not better in few days, she can also increase Lantus to 25 units.

## 2013-11-23 NOTE — Telephone Encounter (Signed)
Called pt and advised her per Dr Arman Filter note. Pt understood. Pt asked if we could send a 30 day supply to [place] [facility] and a 90 day supply to [facility].

## 2013-11-23 NOTE — Telephone Encounter (Signed)
Called pt several times yesterday. Phone was out of order. Finally got a hold of pt. Pt stated that her bg's have been high despite taking the glipizide. Pt's avg fasting bg has been 165 for the past 2 weeks. Her increases are after meals. She has been up to the 260's. Breakfast she is eating a yogurt with a cup of coffee (using splenda and fat free coffee mate), her bg is in the 200's. Please advise what pt should do.

## 2013-11-24 ENCOUNTER — Other Ambulatory Visit: Payer: Self-pay | Admitting: *Deleted

## 2013-11-24 DIAGNOSIS — E119 Type 2 diabetes mellitus without complications: Secondary | ICD-10-CM

## 2013-11-24 MED ORDER — GLIPIZIDE ER 2.5 MG PO TB24: 10.0000 mg | ORAL_TABLET | Freq: Every day | ORAL | Status: DC

## 2013-11-24 NOTE — Telephone Encounter (Signed)
Had to send a new rx to pt's mail order pharmacy.

## 2013-11-25 ENCOUNTER — Ambulatory Visit (INDEPENDENT_AMBULATORY_CARE_PROVIDER_SITE_OTHER): Payer: Medicare Other | Admitting: Family Medicine

## 2013-11-25 ENCOUNTER — Encounter: Payer: Self-pay | Admitting: Family Medicine

## 2013-11-25 VITALS — BP 142/60 | HR 75 | Temp 98.1°F | Ht 64.0 in | Wt 205.8 lb

## 2013-11-25 DIAGNOSIS — E038 Other specified hypothyroidism: Secondary | ICD-10-CM

## 2013-11-25 DIAGNOSIS — N184 Chronic kidney disease, stage 4 (severe): Secondary | ICD-10-CM

## 2013-11-25 DIAGNOSIS — R300 Dysuria: Secondary | ICD-10-CM

## 2013-11-25 DIAGNOSIS — I6529 Occlusion and stenosis of unspecified carotid artery: Secondary | ICD-10-CM

## 2013-11-25 DIAGNOSIS — E538 Deficiency of other specified B group vitamins: Secondary | ICD-10-CM

## 2013-11-25 DIAGNOSIS — I4891 Unspecified atrial fibrillation: Secondary | ICD-10-CM

## 2013-11-25 DIAGNOSIS — Z79899 Other long term (current) drug therapy: Secondary | ICD-10-CM

## 2013-11-25 DIAGNOSIS — Z23 Encounter for immunization: Secondary | ICD-10-CM

## 2013-11-25 DIAGNOSIS — E119 Type 2 diabetes mellitus without complications: Secondary | ICD-10-CM

## 2013-11-25 DIAGNOSIS — N319 Neuromuscular dysfunction of bladder, unspecified: Secondary | ICD-10-CM

## 2013-11-25 DIAGNOSIS — M549 Dorsalgia, unspecified: Secondary | ICD-10-CM

## 2013-11-25 DIAGNOSIS — Z5181 Encounter for therapeutic drug level monitoring: Secondary | ICD-10-CM

## 2013-11-25 DIAGNOSIS — G894 Chronic pain syndrome: Secondary | ICD-10-CM

## 2013-11-25 LAB — POCT URINALYSIS DIPSTICK
Bilirubin, UA: NEGATIVE
Glucose, UA: NEGATIVE
Ketones, UA: NEGATIVE
NITRITE UA: NEGATIVE
PH UA: 6
RBC UA: NEGATIVE
Spec Grav, UA: 1.02
UROBILINOGEN UA: NEGATIVE

## 2013-11-25 LAB — COMPREHENSIVE METABOLIC PANEL
ALT: 15 U/L (ref 0–35)
AST: 15 U/L (ref 0–37)
Albumin: 3.6 g/dL (ref 3.5–5.2)
Alkaline Phosphatase: 73 U/L (ref 39–117)
BILIRUBIN TOTAL: 0.7 mg/dL (ref 0.2–1.2)
BUN: 39 mg/dL — ABNORMAL HIGH (ref 6–23)
CO2: 33 meq/L — AB (ref 19–32)
Calcium: 9.6 mg/dL (ref 8.4–10.5)
Chloride: 98 mEq/L (ref 96–112)
Creatinine, Ser: 1.3 mg/dL — ABNORMAL HIGH (ref 0.4–1.2)
GFR: 42.28 mL/min — AB (ref >60.00)
GLUCOSE: 87 mg/dL (ref 70–99)
Potassium: 4.4 mEq/L (ref 3.5–5.1)
Sodium: 136 mEq/L (ref 135–145)
Total Protein: 7.2 g/dL (ref 6.0–8.3)

## 2013-11-25 LAB — VITAMIN B12: Vitamin B-12: 713 pg/mL (ref 211–911)

## 2013-11-25 LAB — LDL CHOLESTEROL, DIRECT: LDL DIRECT: 103.1 mg/dL

## 2013-11-25 LAB — TSH: TSH: 0.23 u[IU]/mL — ABNORMAL LOW (ref 0.35–4.50)

## 2013-11-25 NOTE — Progress Notes (Signed)
Pre visit review using our clinic review tool, if applicable. No additional management support is needed unless otherwise documented below in the visit note.  H/o AF on digoxin and due for a level today.  Compliant with meds.   Back pain. Longstanding, had been seeing Dr. Orpah Cobb, with intol to mult meds and some relief with tramadol.  We are trying to limit opiate use given her hx with sedation and opiate ADEs but her pain isn't manageable at this point.  We didn't inc tramadol prev due to renal function. She has no ADE with current meds/doses but pain isn't well controlled. We discussed options, specifically rechecking renal tests today.  She was unclear if some her back pain could be coming from a UTI.  She has altered sensation at baseline.  H/o DM2.  Has followed with endo.  Last A1c <7, reviewed with patient.   Due for f/u LDL.   H/o B12 def, on replacement. Due for repeat labs.   Meds, vitals, and allergies reviewed.   ROS: See HPI.  Otherwise, noncontributory.  nad but uncomfortable, in scooter Mmm Neck supple, no LA rrr ctab abd soft, suprapubic area not ttp Lower back ttp at baseline Ext with 1+ BLE edema

## 2013-11-25 NOTE — Patient Instructions (Signed)
Go to the lab on the way out.  We'll contact you with your lab report. Let me get your labs and see about your kidney function before we consider changes on your pain meds.  Take care.  Glad to see you.

## 2013-11-26 LAB — DIGOXIN LEVEL: DIGOXIN LVL: 1.3 ng/mL (ref 0.8–2.0)

## 2013-11-28 LAB — URINE CULTURE: Colony Count: >=100000

## 2013-11-28 NOTE — Assessment & Plan Note (Signed)
See notes on dig level, no change in meds today.

## 2013-11-28 NOTE — Assessment & Plan Note (Signed)
Now normalized. See notes on labs.

## 2013-11-28 NOTE — Assessment & Plan Note (Signed)
I would like Dr. Jodene Nam input.  I would continue tramadol as is for now.

## 2013-11-28 NOTE — Assessment & Plan Note (Signed)
Controlled on last A1c check.

## 2013-11-28 NOTE — Assessment & Plan Note (Signed)
See Cr.  I wouldn't inc her tramadol dose at this point.

## 2013-11-28 NOTE — Assessment & Plan Note (Signed)
We can recheck her TSH later on, but I wouldn't change her dose at this point.

## 2013-11-28 NOTE — Assessment & Plan Note (Signed)
Check ucx.

## 2013-11-29 ENCOUNTER — Other Ambulatory Visit: Payer: Self-pay | Admitting: Family Medicine

## 2013-11-29 MED ORDER — CEPHALEXIN 250 MG PO CAPS: 250.0000 mg | ORAL_CAPSULE | Freq: Three times a day (TID) | ORAL | Status: DC

## 2013-12-14 ENCOUNTER — Ambulatory Visit (INDEPENDENT_AMBULATORY_CARE_PROVIDER_SITE_OTHER): Payer: Medicare Other | Admitting: Cardiovascular Disease

## 2013-12-14 ENCOUNTER — Encounter: Payer: Self-pay | Admitting: Cardiovascular Disease

## 2013-12-14 VITALS — BP 138/82 | HR 65 | Ht 64.0 in | Wt 207.8 lb

## 2013-12-14 DIAGNOSIS — E785 Hyperlipidemia, unspecified: Secondary | ICD-10-CM

## 2013-12-14 DIAGNOSIS — N184 Chronic kidney disease, stage 4 (severe): Secondary | ICD-10-CM

## 2013-12-14 DIAGNOSIS — I739 Peripheral vascular disease, unspecified: Secondary | ICD-10-CM

## 2013-12-14 DIAGNOSIS — E119 Type 2 diabetes mellitus without complications: Secondary | ICD-10-CM

## 2013-12-14 DIAGNOSIS — I4891 Unspecified atrial fibrillation: Secondary | ICD-10-CM

## 2013-12-14 DIAGNOSIS — I10 Essential (primary) hypertension: Secondary | ICD-10-CM

## 2013-12-14 DIAGNOSIS — I6529 Occlusion and stenosis of unspecified carotid artery: Secondary | ICD-10-CM

## 2013-12-14 MED ORDER — ATORVASTATIN CALCIUM 20 MG PO TABS: 20.0000 mg | ORAL_TABLET | Freq: Every day | ORAL | Status: AC

## 2013-12-14 NOTE — Assessment & Plan Note (Signed)
GFR is in the low 40 range. Stable

## 2013-12-14 NOTE — Assessment & Plan Note (Signed)
She has bilateral carotid arterial disease. We'll increase her Lipitor to achieve goal LDL less than 70 Carotid disease has been stable

## 2013-12-14 NOTE — Assessment & Plan Note (Signed)
Heart rate well-controlled. We had a long discussion concerning anticoagulation. She was initially reluctant to be on anticoagulation given her history of subdural hematoma in 2005. He was told by neurosurgery at that time to "never" be on a blood thinner. She is very afraid of a stroke. Mother had a stroke. Unclear whether we should try one of the new agents such as eliquis even at the low dose of 2.5 mg twice a day which might provide some benefit for stroke prevention. We'll discuss with primary care. She may even benefit from consultation with neurosurgery . Recent CT scan in early 2014 and late in 2014 did not show any residual hematoma. I suspect she would be acceptable risk for starting something like eliquis.

## 2013-12-14 NOTE — Assessment & Plan Note (Signed)
Hemoglobin A1c has been climbing with her weight gain. Recommended she watch her weight, change her diet.

## 2013-12-14 NOTE — Patient Instructions (Addendum)
Your next appointment will be scheduled in our new office located at :  [place]  [address] [place], [place] [address]  You are doing well. Please increase the atorvastatin up to 20 mg daily  I will talk with Dr. Damita Dunnings about blood thinner  Please call us if you have new issues that need to be addressed before your next appt.  Your physician wants you to follow-up in: 6 months.  You will receive a reminder letter in the mail two months in advance. If you don't receive a letter, please call our office to schedule the follow-up appointment.

## 2013-12-14 NOTE — Assessment & Plan Note (Signed)
Blood pressure is well controlled on today's visit. No changes made to the medications. 

## 2013-12-14 NOTE — Progress Notes (Signed)
Patient ID: [name], female    DOB: 07/29/1931, 80 y.o.   MRN: [id number]  HPI Comments: Ms Gorton  is a 80 year old woman with chronic atrial fibrillation, long smoking history, history of subdural hematoma while on warfarin in 2005, chronic renal insufficiency on diuretics with history of spinal cord injury and neurogenic bladder, peripheral vascular disease with 40-59% left internal carotid arterial disease  who presents for routine followup.  H/o GI stricture, prior history of smoking, stopped many years ago. Chronic pain in her back and legs She was told by a neurosurgeon in 2005 to not be on any blood thinners. In the past has refused aspirin, Plavix and warfarin for her chronic atrial fibrillation  In followup today, she reports that she is doing well. Weight has trended upwards, hemoglobin A1c up from 5.1 up to 6.6 She has noticed that he deposits in her legs, arms. She is very inactive  she has been relatively active, limited in her mobility, uses a scooter   in the hospital January 2014 with asthma attack. Initially started on diuresis with worsening renal failure Creatinine improved back to baseline by the end of her admission  She denies any significant chest pain.   Previously to the emergency room for mental status changes. Evaluated in the ER 01/06/2013. CT scan of the head showed no stroke. She does report losing vision in her left eye over the course of the past year. She has been evaluated by ophthalmology. She denies stroke as a cause of her vision loss.   Echocardiogram 02/27/2012 showing normal ejection fraction greater than 55%, moderately dilated left atrium, moderately elevated right ventricular systolic pressure 40-98 mm of mercury EKG shows atrial fibrillation with ventricular rate 75 beats per minute, no significant ST or T wave changes  EKG shows atrial fibrillation with rate 65 beats per minute, poor R-wave progression through the anterior precordial leads       Outpatient Encounter Prescriptions as of 12/14/2013  Medication Sig  . acetaminophen (TYLENOL) 650 MG CR tablet Take 650 mg by mouth 2 (two) times daily.   Marland Kitchen albuterol (PROVENTIL) (2.5 MG/3ML) 0.083% nebulizer solution Take 3 mLs (2.5 mg total) by nebulization every 6 (six) hours as needed for wheezing or shortness of breath.  Marland Kitchen atorvastatin (LIPITOR) 10 MG tablet Take 1 tablet (10 mg total) by mouth daily.  . budesonide-formoterol (SYMBICORT) 160-4.5 MCG/ACT inhaler Inhale 2 puffs into the lungs 2 (two) times daily.  . DULoxetine (CYMBALTA) 30 MG capsule Take 1 capsule (30 mg total) by mouth daily.  . fluticasone (FLONASE) 50 MCG/ACT nasal spray Place 2 sprays into both nostrils daily.  . furosemide (LASIX) 40 MG tablet Take 1 tablet (40 mg total) by mouth daily.  Marland Kitchen glipiZIDE (GLIPIZIDE XL) 2.5 MG 24 hr tablet Take 4 tablets (10 mg total) by mouth daily with breakfast.  . insulin glargine (LANTUS) 100 UNIT/ML injection Inject 0.2 mLs (20 Units total) into the skin at bedtime.  . Insulin Syringe-Needle U-100 (B-D INS SYR ULTRAFINE 1CC/30G) 30G X 1/2" 1 ML MISC Use as directed once daily to administer insulin.  Insulin dependent.  250.00  . ipratropium (ATROVENT HFA) 17 MCG/ACT inhaler Inhale 2 puffs into the lungs 2 (two) times daily.  Marland Kitchen LANOXIN 125 MCG tablet TAKE 1 TABLET DAILY  . levothyroxine (SYNTHROID, LEVOTHROID) 88 MCG tablet Take 1 tablet (88 mcg total) by mouth daily before breakfast.  . lisinopril (PRINIVIL,ZESTRIL) 20 MG tablet Take 1 tablet (20 mg total) by mouth 2 (  two) times daily.  . metoprolol succinate (TOPROL-XL) 100 MG 24 hr tablet Take 1 tablet (100 mg total) by mouth daily. Take with or immediately following a meal.  . nystatin-triamcinolone ointment (MYCOLOG) Apply topically.  Marland Kitchen omeprazole (PRILOSEC) 20 MG capsule TAKE 1 CAPSULE 45 MINUTES PRIOR TO BREAKFAST  . polyethylene glycol powder (GLYCOLAX/MIRALAX) powder Take 17 g by mouth daily.  . pregabalin (LYRICA) 75  MG capsule Take 1 capsule (75 mg total) by mouth 2 (two) times daily.  Marland Kitchen ROCALTROL 0.5 MCG capsule TAKE 1 CAPSULE DAILY  . traMADol (ULTRAM) 50 MG tablet Take 1-2 tablets (50-100 mg total) by mouth 3 (three) times daily as needed for severe pain (max 6 per day).  . vitamin B-12 (CYANOCOBALAMIN) 1000 MCG tablet Take 1 tablet (1,000 mcg total) by mouth daily.    Review of Systems  Constitutional: Negative.   HENT: Negative.   Eyes: Negative.   Respiratory: Negative.   Cardiovascular: Negative.   Gastrointestinal: Negative.   Endocrine: Negative.   Musculoskeletal: Positive for back pain and gait problem.  Skin: Negative.   Allergic/Immunologic: Negative.   Neurological: Negative.   Hematological: Negative.   Psychiatric/Behavioral: Negative.   All other systems reviewed and are negative.   BP 138/82  Pulse 65  Ht 5\' 4"  (1.626 m)  Wt 207 lb 12 oz (94.235 kg)  BMI 35.64 kg/m2  Physical Exam  Nursing note and vitals reviewed. Constitutional: She is oriented to person, place, and time. She appears well-developed and well-nourished.  Presents in a wheelchair. Obese.  HENT:  Head: Normocephalic.  Nose: Nose normal.  Mouth/Throat: Oropharynx is clear and moist.  Eyes: Conjunctivae are normal. Pupils are equal, round, and reactive to light.  Neck: Normal range of motion. Neck supple. No JVD present. Carotid bruit is present.  Cardiovascular: Normal rate, S1 normal, S2 normal and intact distal pulses.  An irregularly irregular rhythm present. Exam reveals no gallop and no friction rub.   Murmur heard.  Crescendo systolic murmur is present with a grade of 2/6  Trace bilateral leg edema  Pulmonary/Chest: Effort normal and breath sounds normal. No respiratory distress. She has no wheezes. She has no rales. She exhibits no tenderness.  Abdominal: Soft. Bowel sounds are normal. She exhibits no distension. There is no tenderness.  Musculoskeletal: Normal range of motion. She exhibits no  edema and no tenderness.  Lymphadenopathy:    She has no cervical adenopathy.  Neurological: She is alert and oriented to person, place, and time. Coordination normal.  Skin: Skin is warm and dry. No rash noted. No erythema.  Psychiatric: She has a normal mood and affect. Her behavior is normal. Judgment and thought content normal.    Assessment and Plan

## 2013-12-14 NOTE — Assessment & Plan Note (Signed)
Recommend that she increase her Lipitor up to 20 mg daily. I suspect she will need 40 mg daily. Goal LDL less than 70

## 2013-12-15 NOTE — Telephone Encounter (Signed)
error 

## 2013-12-16 ENCOUNTER — Other Ambulatory Visit: Payer: Self-pay | Admitting: Family Medicine

## 2013-12-18 ENCOUNTER — Other Ambulatory Visit: Payer: Self-pay | Admitting: Family Medicine

## 2013-12-18 DIAGNOSIS — Z8679 Personal history of other diseases of the circulatory system: Secondary | ICD-10-CM

## 2013-12-21 ENCOUNTER — Telehealth: Payer: Self-pay

## 2013-12-21 NOTE — Telephone Encounter (Signed)
Noted, please send a copy of the note to cardiology.  Routed to cards as a FYI.  Thanks.

## 2013-12-21 NOTE — Telephone Encounter (Signed)
[name] from Dr Melven Sartorius office, neurosurgeon said Dr Vertell Limber wrote on the referral form to see if pt is candidate for anti coagulation therapy rather than seeing pt that Xarelto would be fine for pt to take. Pt has hx of subdural hematoma.[name] will fax Dr Donald Pore note to [phone] at [facility] desk. Fax in Dr Josefine Class in box on desk.

## 2013-12-21 NOTE — Telephone Encounter (Addendum)
Form given to [facility] to fax first thing in the morning to Dr. Investment banker, operational. Needs to be sent to be scanned into the chart.

## 2013-12-22 NOTE — Telephone Encounter (Signed)
Document faxed to Dr Rockey Situ and sent for scanning into patients chart.

## 2013-12-23 NOTE — Telephone Encounter (Signed)
Perhaps we could start eliquis 2.5 mg twice a day for several weeks before titrating up to 5 mg twice a day Let me know if you want me to start. I have samples

## 2013-12-25 NOTE — Telephone Encounter (Signed)
I would prefer you to start it.  We don't have samples.  Thanks.

## 2013-12-27 NOTE — Telephone Encounter (Signed)
Can we have her come in for samples of eliquis 2.5 mg twice a day for 2 weeks then titration up to 5 mg twice a day She will need to be educated on what to look for in terms of GI bleeding Any sign of bleeding, would hold the anticoagulation Any falls would hold the anticoagulation until she has been evaluated

## 2013-12-28 NOTE — Telephone Encounter (Signed)
Attempted to contact pt.   No answer at only contact number.

## 2013-12-30 NOTE — Telephone Encounter (Signed)
Spoke w/ pt.  Advised her of Dr. Rockey Situ & Dr. Josefine Class recommendations. She verbalizes understanding, but states that she does not want to start this med at this time, as she had a brain bleed previously and "I don't want to put my family through that again".  She has an appt w/ her surgeon next week to discuss.  She will let us know if she changes her mind.

## 2014-01-06 ENCOUNTER — Other Ambulatory Visit: Payer: Self-pay | Admitting: Internal Medicine

## 2014-01-07 NOTE — Telephone Encounter (Signed)
Sent. Thanks.   

## 2014-01-09 ENCOUNTER — Ambulatory Visit (INDEPENDENT_AMBULATORY_CARE_PROVIDER_SITE_OTHER): Payer: Medicare Other | Admitting: Podiatry

## 2014-01-09 DIAGNOSIS — B351 Tinea unguium: Secondary | ICD-10-CM

## 2014-01-09 DIAGNOSIS — M79676 Pain in unspecified toe(s): Secondary | ICD-10-CM

## 2014-01-09 NOTE — Progress Notes (Signed)
Presents today chief complaint of painful elongated toenails.  Objective: Pulses are palpable bilateral nails are thick, yellow dystrophic onychomycosis and painful palpation.   Assessment: Onychomycosis with pain in limb.  Plan: Treatment of nails in thickness and length as covered service secondary to pain.  

## 2014-01-09 NOTE — Progress Notes (Signed)
She presents today for chief complaint of painful elongated toenails. ° °Objective: Nails are thick yellow dystrophic with mycotic painful palpation. ° °Assessment: Pain in limb secondary to onychomycosis 1 through 5 bilateral. ° °Plan: Debridement of nails thickness and length as cover service secondary to pain. °

## 2014-01-24 ENCOUNTER — Other Ambulatory Visit: Payer: Self-pay | Admitting: Internal Medicine

## 2014-01-24 NOTE — Telephone Encounter (Signed)
Electronic refill request.   Do you refill this or Dr. Cruzita Lederer?

## 2014-01-25 NOTE — Telephone Encounter (Signed)
I'm okay sending it in.  Sent.  Thanks.

## 2014-01-26 ENCOUNTER — Other Ambulatory Visit: Payer: Self-pay | Admitting: Internal Medicine

## 2014-01-27 ENCOUNTER — Encounter: Payer: Self-pay | Admitting: Internal Medicine

## 2014-01-27 ENCOUNTER — Ambulatory Visit (INDEPENDENT_AMBULATORY_CARE_PROVIDER_SITE_OTHER): Payer: Medicare Other | Admitting: Internal Medicine

## 2014-01-27 VITALS — BP 118/64 | HR 67 | Temp 98.1°F | Resp 14 | Wt 207.0 lb

## 2014-01-27 DIAGNOSIS — I6529 Occlusion and stenosis of unspecified carotid artery: Secondary | ICD-10-CM

## 2014-01-27 DIAGNOSIS — E119 Type 2 diabetes mellitus without complications: Secondary | ICD-10-CM

## 2014-01-27 MED ORDER — LINAGLIPTIN 5 MG PO TABS: 5.0000 mg | ORAL_TABLET | Freq: Every day | ORAL | Status: DC

## 2014-01-27 NOTE — Progress Notes (Signed)
Subjective:     Patient ID: [name], female   DOB: 10-23-1931, 80 y.o.   MRN: [id number]  HPI [name] is a very pleasant 80 y.o. woman, returning for f/u for DM2, dx 1998, insulin-dependent since 1991, controlled, with complications (CKD stage 4 - last GFR 40, peripheral neuropathy, PVD). Last visit 2.5 mo ago.  She has had 2 UTIs in last 2 months. She sees urology and nephrology. She is still on ABx, but feels sxs are not clearing up.  Last HbA1C: Lab Results  Component Value Date   HGBA1C 6.6* 10/28/2013  Previous HbA1C was 7.2%, but before this, 5.3-6%.   She brings a very detailed, exemplary-kept, CBG log, in which she documents sugars checked at least 6x a day: before and 2h after each meal.  - before b'fast: 85-130, mostly 100-120 >> 98-145 >> 117-174, most <150 >> 99-160 - 2h after b'fast: 122-227 >> 114-200 - before dinner: 107-169 >> 140-185 - 2h after dinner: 140-220, mostly ~180 >> 110-154 (234) >> 127-194 >> 171-200, higher for Txgiving. Higher 300s.   She is on: - Lantus 20 >> 25 units qhs.  - Glipizide XL 20 mg  in am. She uses the Lantus but because of arthritis, she had to switch from the pen to the syringes.  She is off Humalog SSI.  She has a h/o thyroidectomy and is on replacement. Her last TSH was: Lab Results  Component Value Date   TSH 0.23* 11/25/2013   Has CKD. Reviewed BUN/Cr:  BUN  Date Value Ref Range Status  11/25/2013 39* 6 - 23 mg/dL Final  06/27/2013 35* 6 - 23 mg/dL Final  04/25/2013 39* 6 - 23 mg/dL Final  01/06/2013 16 6 - 23 mg/dL Final  12/28/2012 17 6 - 23 mg/dL Final  07/28/2011 25* 6 - 23 mg/dL Final   CREATININE, SER  Date Value Ref Range Status  11/25/2013 1.3* 0.4 - 1.2 mg/dL Final  06/27/2013 1.20* 0.50 - 1.10 mg/dL Final  04/25/2013 1.1 0.4 - 1.2 mg/dL Final  01/06/2013 1.11* 0.50 - 1.10 mg/dL Final  12/28/2012 1.2 0.4 - 1.2 mg/dL Final  07/28/2011 1.3* 0.4 - 1.2 mg/dL Final   She sees her ophthalmologist yearly   >> last exam 06/2013 >> lost vision L eye >> macular degeneration. Had cataract sx both eyes.  She has numbness and tingling in hands and feet.   I reviewed pt's medications, allergies, PMH, social hx, family hx and no changes required, except as mentioned above.  Review of Systems Constitutional: + weight gain, + fatigue, no subjective hyperthermia/hypothermia, + excessive urination, + poor sleep  Eyes: + blurry vision, no xerophthalmia ENT: no sore throat, no nodules palpated in throat, no dysphagia/odynophagia, no hoarseness Cardiovascular: no CP/SOB/palpitations/leg swelling Respiratory: no cough/SOB Gastrointestinal: no N/V/D/C, + AP (LLQ) Musculoskeletal: no muscle/joint aches Skin: no rashes, + itching Neurological: no tremors/numbness/tingling/dizziness, + HA  Objective:   Physical Exam BP 118/64 mmHg  Pulse 67  Temp(Src) 98.1 F (36.7 C) (Oral)  Resp 14  Wt 207 lb (93.895 kg)  SpO2 97%   Wt Readings from Last 3 Encounters:  01/27/14 207 lb (93.895 kg)  12/14/13 207 lb 12 oz (94.235 kg)  11/25/13 205 lb 12.8 oz (93.35 kg)   Constitutional: overweight, in NAD, in wheelchair Eyes: PERRLA, EOMI, no exophthalmos ENT: very dry mucous membranes, no thyromegaly, no cervical lymphadenopathy Cardiovascular: irregularly irregular rhythm, No MRG, arms and leg edema Respiratory: CTA B Gastrointestinal: abdomen soft, NT, ND, BS+ Musculoskeletal:  no deformities, strength intact in all 4 Skin: moist, warm, no rashes  Assessment:     1. DM2,insulin-dependent, controlled, with complications - CKD stage 4 - peripheral neuropathy - PVD    Plan:     1. Pt has DM2 with recent worsening control on Lantus + glipizide XL. Will try Tradjenta, but we will likely need Humalog before meals. She agrees with this and will call me with her sugars in ~2 wweeks. She will need a Humalog Rx in that case. - I advised her to: Patient Instructions  Please continue Glipizide XL 10 mg (4 tablets  of 2.5 mg) in am. Continue Lantus 25 mg in am. Start Tradjenta 5 mg in am. Please let me know if the sugars are consistently <80 or >200. Please return in 1.5 months with your sugar log.  Please call me in 2 weeks to let me know how your sugars are to see if we need to restart Humalog. Please stop at the lab. - UTD with eye exams - will check HbA1c today - RTC in 1.5 mo  Office Visit on 01/27/2014  Component Date Value Ref Range Status  . Hgb A1c MFr Bld 01/27/2014 7.0* 4.6 - 6.5 % Final   Glycemic Control Guidelines for People with Diabetes:Non Diabetic:  <6%Goal of Therapy: <7%Additional Action Suggested:  >8%   A1c a little higher, but still good.

## 2014-01-27 NOTE — Patient Instructions (Addendum)
Please continue Glipizide XL 10 mg (4 tablets of 2.5 mg) in am. Continue Lantus 25 mg in am. Start Tradjenta 5 mg in am. Please let me know if the sugars are consistently <80 or >200. Please return in 1.5 months with your sugar log.  Please call me in 2 weeks to let me know how your sugars are to see if we need to restart Humalog. Please stop at the lab.

## 2014-01-29 LAB — HEMOGLOBIN A1C: Hgb A1c MFr Bld: 7 % — ABNORMAL HIGH (ref 4.6–6.5)

## 2014-02-02 ENCOUNTER — Other Ambulatory Visit: Payer: Self-pay | Admitting: Internal Medicine

## 2014-02-02 ENCOUNTER — Telehealth: Payer: Self-pay | Admitting: Internal Medicine

## 2014-02-02 ENCOUNTER — Other Ambulatory Visit: Payer: Self-pay | Admitting: Family Medicine

## 2014-02-03 ENCOUNTER — Other Ambulatory Visit: Payer: Self-pay | Admitting: *Deleted

## 2014-02-03 MED ORDER — VITAMIN B-12 1000 MCG PO TABS: 1000.0000 ug | ORAL_TABLET | Freq: Every day | ORAL | Status: AC

## 2014-02-03 MED ORDER — METOPROLOL SUCCINATE ER 100 MG PO TB24: 100.0000 mg | ORAL_TABLET | Freq: Every day | ORAL | Status: DC

## 2014-02-03 MED ORDER — GLIPIZIDE ER 2.5 MG PO TB24: 2.5000 mg | ORAL_TABLET | Freq: Every day | ORAL | Status: DC

## 2014-02-07 ENCOUNTER — Other Ambulatory Visit: Payer: Self-pay | Admitting: *Deleted

## 2014-02-07 MED ORDER — GLIPIZIDE ER 2.5 MG PO TB24: 10.0000 mg | ORAL_TABLET | Freq: Every day | ORAL | Status: DC

## 2014-02-09 NOTE — Telephone Encounter (Signed)
We really need to cure her chronic UTI... Did they schedule an appt with ID? In the meantime, we likely need to add back Humalog - let's start with: - 6 units before a smaller meal  - 8 units before a larger meal Continue the rest of the meds but call us on Monday with her sugars so we can adjust these doses. Check sugars a little more often after she starts the mealtime insulin to make sure she has no lows.

## 2014-02-09 NOTE — Telephone Encounter (Signed)
Patient stated that the Bladder infections back and her B/S is over 300. Please advise

## 2014-02-10 MED ORDER — INSULIN PEN NEEDLE 32G X 4 MM MISC: Status: AC

## 2014-02-10 MED ORDER — INSULIN LISPRO 100 UNIT/ML (KWIKPEN): PEN_INJECTOR | SUBCUTANEOUS | Status: DC

## 2014-02-10 MED ORDER — INSULIN PEN NEEDLE 32G X 4 MM MISC
Status: DC
Start: 2014-02-10 — End: 2014-02-10

## 2014-02-10 NOTE — Telephone Encounter (Signed)
Called pt and advised her per Dr Arman Filter note. Pt understood. Pt requested 30 day supply sent to her local pharmacy and 90 day sent to [facility]. Done.

## 2014-02-14 ENCOUNTER — Other Ambulatory Visit: Payer: Self-pay | Admitting: Family Medicine

## 2014-02-21 ENCOUNTER — Other Ambulatory Visit: Payer: Self-pay | Admitting: Family Medicine

## 2014-02-28 ENCOUNTER — Telehealth: Payer: Self-pay | Admitting: *Deleted

## 2014-02-28 NOTE — Telephone Encounter (Signed)
Faxed refill request for Nitrofurantoin Mono/Mac Caps 100 mg - 90 day supply.  It looks like Dr. Cruzita Lederer prescribed this last time.  See telephone note.  Medication is not on patient's meds list.  Please advise if I need to fax this to Dr. Cruzita Lederer.   Faxed refill request is in your In Box.

## 2014-03-01 NOTE — Telephone Encounter (Signed)
[name], She has refractory UTI which I treated once with Nitrofurantoin, but this did not help much. At last visit, I advised her to see Inf Dis for this as she has multiple AB allergies and resistant org.. I am not comfortable refilling her Nitrofurantoin.

## 2014-03-01 NOTE — Telephone Encounter (Signed)
I would ask for Gherghe's input on this, just so we don't double up on her rx. Routed to her as a Pharmacist, hospital.

## 2014-03-02 NOTE — Telephone Encounter (Signed)
Call pt. See below. We need to either get her set up with ID or uro.  I am okay with either. Thanks.   I didn't refill the med.

## 2014-03-02 NOTE — Telephone Encounter (Signed)
Noted, thanks!

## 2014-03-02 NOTE — Telephone Encounter (Signed)
Patient says she doesn't even know why the request was sent here.  She is seeing her urologist who has prescribed something different and she has a follow up with the urologist tomorrow and with Dr. Mercy Moore next week.  Patient requests that we deny the prescription and she is sorry for the confusion and appreciative for all who are trying to help her.

## 2014-03-07 ENCOUNTER — Telehealth: Payer: Self-pay | Admitting: Internal Medicine

## 2014-03-07 NOTE — Telephone Encounter (Signed)
Patient stated that Glipizide is not working, please advise

## 2014-03-08 NOTE — Telephone Encounter (Signed)
[name], Did she start the Humalog? We started 6 units for a smaller meal and 8 units before a laregr meal. If she did, let's increase the doses by 3 units each. If she did not, please start. Call back with sugars on Friday.

## 2014-03-08 NOTE — Telephone Encounter (Signed)
Called pt and advised her per Dr Arman Filter note. Pt understood. Pt will call back on Friday.

## 2014-03-08 NOTE — Telephone Encounter (Signed)
Called pt and she said that her numbers have been between 250 and 325. She finally got the Tradjenta approved by [employer] and she started it last Friday. Pt is still taking the Glipizide. She doesn't know what to do. Pt's UTI is finally cleared up, but she still has some type of infection. She is having a CT scan on Jan. 19th, so they can see what is going on. Pt concerned. Please advise.

## 2014-03-09 ENCOUNTER — Ambulatory Visit: Payer: Medicare Other | Admitting: Internal Medicine

## 2014-03-13 LAB — HM DIABETES EYE EXAM

## 2014-03-21 ENCOUNTER — Encounter: Payer: Self-pay | Admitting: Family Medicine

## 2014-03-24 ENCOUNTER — Other Ambulatory Visit: Payer: Self-pay | Admitting: *Deleted

## 2014-03-24 MED ORDER — LINAGLIPTIN 5 MG PO TABS: 5.0000 mg | ORAL_TABLET | Freq: Every day | ORAL | Status: DC

## 2014-03-24 MED ORDER — GLIPIZIDE ER 2.5 MG PO TB24: 10.0000 mg | ORAL_TABLET | Freq: Every day | ORAL | Status: AC

## 2014-03-28 ENCOUNTER — Ambulatory Visit: Payer: Medicare Other | Admitting: Internal Medicine

## 2014-04-07 ENCOUNTER — Telehealth: Payer: Self-pay | Admitting: Internal Medicine

## 2014-04-07 NOTE — Telephone Encounter (Signed)
Patient stated that the new medTradjentad is working a little, b/s is still in the 200, pain in left side could be contributing to it, please advise

## 2014-04-07 NOTE — Telephone Encounter (Signed)
Let's move her appt sooner than 1 mo from now so I can look at her sugars first. She is on Humalog so we can increase that but I need to know exactly how much she is taking now and wat the sugars are at different times of the day to be able to adjust.

## 2014-04-10 NOTE — Telephone Encounter (Signed)
Called pt and lvm advising her per Dr Arman Filter note. Advised pt to call back.

## 2014-04-11 NOTE — Telephone Encounter (Signed)
To get her in before her appt on 3/16.

## 2014-04-11 NOTE — Telephone Encounter (Signed)
I can see her tomorrow around 11:45 am. May be able to go in at 12, but let's try 11:45.

## 2014-04-11 NOTE — Telephone Encounter (Signed)
Called pt and she cannot come in this afternoon. Her niece can only do late morning appts.

## 2014-04-11 NOTE — Telephone Encounter (Signed)
Pt returned call and said that she is checking her sugars before breakfast and before dinner. They have been around 225. This week they have been under 200. She feels the [place] is helping. We were unable to

## 2014-04-11 NOTE — Telephone Encounter (Signed)
Lvm for pt to return call.

## 2014-04-11 NOTE — Telephone Encounter (Signed)
Can she come today pm?

## 2014-04-12 ENCOUNTER — Telehealth: Payer: Self-pay | Admitting: Internal Medicine

## 2014-04-12 NOTE — Telephone Encounter (Signed)
[name], does she have Humalog at home. If so, she may need to start 5 units before each meal. Please let us know about the sugars on Friday.

## 2014-04-12 NOTE — Telephone Encounter (Signed)
Pt unable to come in today or tomorrow. Be advised.

## 2014-04-12 NOTE — Telephone Encounter (Signed)
Pt is unable to come in tomorrow,

## 2014-04-12 NOTE — Telephone Encounter (Signed)
Called pt and advised her per Dr Arman Filter note. Pt stated she is doing 5 units before a small meal and 11 units before a large meal. Pt will call on Friday or fax her b/s readings. Be advised.

## 2014-04-17 ENCOUNTER — Ambulatory Visit: Payer: Medicare Other | Admitting: Podiatry

## 2014-04-24 ENCOUNTER — Ambulatory Visit (INDEPENDENT_AMBULATORY_CARE_PROVIDER_SITE_OTHER): Payer: Medicare Other | Admitting: Podiatry

## 2014-04-24 ENCOUNTER — Telehealth: Payer: Self-pay | Admitting: Internal Medicine

## 2014-04-24 ENCOUNTER — Other Ambulatory Visit: Payer: Self-pay | Admitting: *Deleted

## 2014-04-24 DIAGNOSIS — M79676 Pain in unspecified toe(s): Secondary | ICD-10-CM

## 2014-04-24 DIAGNOSIS — B351 Tinea unguium: Secondary | ICD-10-CM | POA: Diagnosis not present

## 2014-04-24 MED ORDER — LINAGLIPTIN 5 MG PO TABS
5.0000 mg | ORAL_TABLET | Freq: Every day | ORAL | Status: DC
Start: 2014-04-24 — End: 2014-04-24

## 2014-04-24 MED ORDER — LINAGLIPTIN 5 MG PO TABS: 5.0000 mg | ORAL_TABLET | Freq: Every day | ORAL | Status: DC

## 2014-04-24 NOTE — Telephone Encounter (Signed)
Patient stated that her b/s was coming down with the Tradjenta, for the last two days her b/s has been under 200. She also need a script for [facility], she only has seven pills left.

## 2014-04-24 NOTE — Progress Notes (Signed)
She presents today for chief complaint of painful elongated toenails.  Objective: Nails are thick yellow dystrophic with mycotic painful palpation.  Assessment: Pain in limb secondary to onychomycosis 1 through 5 bilateral.  Plan: Debridement of nails thickness and length as cover service secondary to pain.

## 2014-04-24 NOTE — Telephone Encounter (Signed)
Refill sent to [facility].  

## 2014-04-24 NOTE — Telephone Encounter (Signed)
OK 

## 2014-04-24 NOTE — Telephone Encounter (Signed)
Rx refill for Tradjenta did not go through. Resending.

## 2014-04-24 NOTE — Telephone Encounter (Signed)
Please read message below. Rx refill for Tradjenta sent to [facility].

## 2014-04-27 ENCOUNTER — Other Ambulatory Visit: Payer: Self-pay | Admitting: Family Medicine

## 2014-05-10 ENCOUNTER — Encounter: Payer: Self-pay | Admitting: Internal Medicine

## 2014-05-10 ENCOUNTER — Ambulatory Visit (INDEPENDENT_AMBULATORY_CARE_PROVIDER_SITE_OTHER): Payer: Medicare Other | Admitting: Internal Medicine

## 2014-05-10 VITALS — BP 172/92 | HR 68

## 2014-05-10 DIAGNOSIS — E039 Hypothyroidism, unspecified: Secondary | ICD-10-CM

## 2014-05-10 DIAGNOSIS — E1165 Type 2 diabetes mellitus with hyperglycemia: Secondary | ICD-10-CM

## 2014-05-10 LAB — HEMOGLOBIN A1C: Hgb A1c MFr Bld: 7.4 % — ABNORMAL HIGH (ref 4.6–6.5)

## 2014-05-10 LAB — TSH: TSH: 2.22 u[IU]/mL (ref 0.35–4.50)

## 2014-05-10 LAB — T4, FREE: Free T4: 1.08 ng/dL (ref 0.60–1.60)

## 2014-05-10 NOTE — Patient Instructions (Signed)
Please increase Lantus to 30 units at bedtime.  Continue same dose of Humalog: - 9 units before b'fast - 9 units before lunch - 11 units before dinner  Continue: - Tradjenta 5 mg daily >> move this to am - Glipizide XL 10 mg daily in am.  Take the thyroid hormone every day, with water, >30 minutes before breakfast, separated by >4 hours from acid reflux medications, calcium, iron, multivitamins.  Please stop at the lab.  Please return in 1.5 month with your sugar log.

## 2014-05-10 NOTE — Progress Notes (Addendum)
Subjective:     Patient ID: [name], female   DOB: 1931-09-12, 81 y.o.   MRN: [id number]  HPI [name] is a very pleasant 80 y.o. woman, returning for f/u for DM2, dx 1998, insulin-dependent since 1991, controlled, with complications (CKD stage 4 - last GFR 40, peripheral neuropathy, PVD). Last visit 3 mo ago.  She has recurrent UTIs. She sees urology and nephrology. Now resolved.  Last HbA1C: Lab Results  Component Value Date   HGBA1C 7.0* 01/27/2014   HGBA1C 6.6* 10/28/2013   HGBA1C 5.6 07/29/2013  Previous HbA1C was 7.2%, but before this, 5.3-6%.   She brings a very detailed, exemplary-kept, CBG log - sugars higher than last time: - before b'fast: 85-130, mostly 100-120 >> 98-145 >> 117-174, most <150 >> 99-160 >> 196-212 - 2h after b'fast: 122-227 >> 114-200 >> 118x1, 119x1, 182-223 - before dinner: 107-169 >> 140-185 >> 116 x1, 147-200 - 2h after dinner: 140-220, mostly ~180 >> 110-154 (234) >> 127-194 >> 171-200 >> 163-218 Higher 300s >> 200s  She is on: - Lantus 20 >> 25 units qhs.  - Humalog 11-02-09 - Tradjenta 5 mg before dinner! - Glipizide XL 10 mg  in am. She uses the Lantus but because of arthritis, she had to switch from the pen to the syringes.   Meals: - b'fast: yoghurt 5/7 days - lunch: sandwich + fruit, tangerine - dinner: Lean cuisine + fruit  She has a h/o thyroidectomy and is on replacement. Her last TSH was: Lab Results  Component Value Date   TSH 0.23* 11/25/2013  She is on LT4: am, fasting, b'fast 15-30 min later, + PPI in am, Ca + MVI later in the day.  Has CKD. Reviewed BUN/Cr:  BUN  Date Value Ref Range Status  11/25/2013 39* 6 - 23 mg/dL Final  06/27/2013 35* 6 - 23 mg/dL Final  04/25/2013 39* 6 - 23 mg/dL Final  01/06/2013 16 6 - 23 mg/dL Final  12/28/2012 17 6 - 23 mg/dL Final  07/28/2011 25* 6 - 23 mg/dL Final   CREATININE, SER  Date Value Ref Range Status  11/25/2013 1.3* 0.4 - 1.2 mg/dL Final  06/27/2013 1.20* 0.50 - 1.10  mg/dL Final  04/25/2013 1.1 0.4 - 1.2 mg/dL Final  01/06/2013 1.11* 0.50 - 1.10 mg/dL Final  12/28/2012 1.2 0.4 - 1.2 mg/dL Final  07/28/2011 1.3* 0.4 - 1.2 mg/dL Final   She sees her ophthalmologist yearly  >> last exam 06/2013 >> lost vision L eye >> macular degeneration. Had cataract sx both eyes.  She has numbness and tingling in hands and feet.   I reviewed pt's medications, allergies, PMH, social hx, family hx, and changes were documented in the history of present illness. Otherwise, unchanged from my initial visit note.   Review of Systems Constitutional: + weight gain, + fatigue, no subjective hyperthermia/hypothermia, + excessive urination, + poor sleep  Eyes: + blurry vision, no xerophthalmia ENT: no sore throat, no nodules palpated in throat, no dysphagia/odynophagia, no hoarseness, + decreased hearing Cardiovascular: no CP/+ SOB/no palpitations/leg swelling Respiratory: no cough/+ SOB Gastrointestinal: no N/V/D/C Musculoskeletal: + all: muscle/joint aches Skin: no rashes, + itching Neurological: no tremors/numbness/tingling/dizziness, + HA  Objective:   Physical Exam BP 172/92 mmHg  Pulse 68  SpO2 94% patient was not weighed, she was in wheelchair.  Wt Readings from Last 3 Encounters:  01/27/14 207 lb (93.895 kg)  12/14/13 207 lb 12 oz (94.235 kg)  11/25/13 205 lb 12.8 oz (93.35 kg)  Constitutional: overweight, in NAD, in wheelchair Eyes: PERRLA, EOMI, no exophthalmos ENT: very dry mucous membranes, no thyromegaly, no cervical lymphadenopathy Cardiovascular: irregularly irregular rhythm, No MRG, arms and leg edema Respiratory: CTA B Gastrointestinal: abdomen soft, NT, ND, BS+ Musculoskeletal: no deformities, strength intact in all 4 Skin: moist, warm, no rashes  Assessment:     1. DM2,insulin-dependent, controlled, with complications - CKD stage 4 - peripheral neuropathy - PVD  2. Hypothyroidism - post-surgical    Plan:     1. Pt has DM2 with  worsening control on Lantus + glipizide XL, Tradjenta, and mealtime insulin. Will increase Lantus and move Tradjenta in am. She will call me in 7-10 days with sugars to see if we need to increase Humalog. - I advised her to: Patient Instructions  Please increase Lantus to 30 units at bedtime.  Continue same dose of Humalog: - 9 units before b'fast - 9 units before lunch - 11 units before dinner  Continue: - Tradjenta 5 mg daily >> move this to am - Glipizide XL 10 mg daily in am.  Take the thyroid hormone every day, with water, >30 minutes before breakfast, separated by >4 hours from acid reflux medications, calcium, iron, multivitamins.  Please stop at the lab.  Please return in 1.5 month with your sugar log.   - UTD with eye exams - will check HbA1c today - RTC in 1.5 mo  2. Hypothyroidism - last TSH suppressed - on 88 mcg LT4 + PPI! - Take the thyroid hormone every day, with water, >30 minutes before breakfast, separated by >4 hours from acid reflux medications, calcium, iron, multivitamins. Needs to move PPI later in the day - will check TFTs today, may need decrease in dose - will recheck tests in 1.5 mo  Office Visit on 05/10/2014  Component Date Value Ref Range Status  . Hgb A1c MFr Bld 05/10/2014 7.4* 4.6 - 6.5 % Final   Glycemic Control Guidelines for People with Diabetes:Non Diabetic:  <6%Goal of Therapy: <7%Additional Action Suggested:  >8%   . TSH 05/10/2014 2.22  0.35 - 4.50 uIU/mL Final  . Free T4 05/10/2014 1.08  0.60 - 1.60 ng/dL Final   Message sent: Dear Jill. Sohail,  The hemoglobin A1c is a little higher than before, which was to be expected from your sugar log. The thyroid tests are normal. Sincerely, Philemon Kingdom MD

## 2014-05-23 ENCOUNTER — Other Ambulatory Visit: Payer: Self-pay | Admitting: Family Medicine

## 2014-05-24 ENCOUNTER — Telehealth: Payer: Self-pay | Admitting: Internal Medicine

## 2014-05-24 ENCOUNTER — Other Ambulatory Visit: Payer: Self-pay

## 2014-05-24 MED ORDER — LINAGLIPTIN 5 MG PO TABS: 5.0000 mg | ORAL_TABLET | Freq: Every day | ORAL | Status: AC

## 2014-05-24 NOTE — Telephone Encounter (Signed)
Pt is out of trajenta and the rx neesd to [facility]

## 2014-05-24 NOTE — Telephone Encounter (Signed)
Pt left v/m;pt request refill lyrica and [facility]ad[facility]; pt request cb; pt will be out of med next week.

## 2014-05-24 NOTE — Telephone Encounter (Signed)
Rx refill for Tradjenta sent to [facility].

## 2014-05-25 ENCOUNTER — Other Ambulatory Visit: Payer: Self-pay | Admitting: *Deleted

## 2014-05-25 MED ORDER — TRAMADOL HCL 50 MG PO TABS: 50.0000 mg | ORAL_TABLET | Freq: Three times a day (TID) | ORAL | Status: DC | PRN

## 2014-05-25 MED ORDER — PREGABALIN 75 MG PO CAPS: 75.0000 mg | ORAL_CAPSULE | Freq: Two times a day (BID) | ORAL | Status: AC

## 2014-05-25 NOTE — Telephone Encounter (Signed)
Both printed. Thanks.  

## 2014-05-25 NOTE — Telephone Encounter (Signed)
I wouldn't go up on the tramadol.  See if she can come in so we can discuss options. I haven't seen a recent CAT scan.  30 min appointment. Thanks.

## 2014-05-25 NOTE — Telephone Encounter (Signed)
Patient advised.   Patient will phone back for appointment when she is able to arrange transportation.

## 2014-05-25 NOTE — Telephone Encounter (Signed)
Patient asks if there is any way that she could take more of the Tramadol?  Patient says it is not controlling the pain.  She asks if we have received copies of her recent CAT scans and she believes that will explain her need for increased pain med.  I will hold the printed Rx before faxing, awaiting your response.

## 2014-05-26 NOTE — Telephone Encounter (Signed)
I saw the CT report from urology- it noted: posterior orthopedic hardware in the lumbosacral spine with bone harvest sites in the iliac wings.  We can discuss at the [place].  Thanks .

## 2014-06-02 ENCOUNTER — Ambulatory Visit (INDEPENDENT_AMBULATORY_CARE_PROVIDER_SITE_OTHER): Payer: Medicare Other | Admitting: Family Medicine

## 2014-06-02 ENCOUNTER — Encounter: Payer: Self-pay | Admitting: Family Medicine

## 2014-06-02 VITALS — BP 144/76 | HR 90 | Temp 98.4°F | Resp 18 | Wt 223.0 lb

## 2014-06-02 DIAGNOSIS — G8929 Other chronic pain: Secondary | ICD-10-CM

## 2014-06-02 DIAGNOSIS — M549 Dorsalgia, unspecified: Secondary | ICD-10-CM | POA: Diagnosis not present

## 2014-06-02 MED ORDER — OXYCODONE HCL 5 MG PO TABS: 5.0000 mg | ORAL_TABLET | Freq: Four times a day (QID) | ORAL | Status: DC | PRN

## 2014-06-02 MED ORDER — ALBUTEROL SULFATE HFA 108 (90 BASE) MCG/ACT IN AERS: 2.0000 | INHALATION_SPRAY | Freq: Four times a day (QID) | RESPIRATORY_TRACT | Status: AC | PRN

## 2014-06-02 NOTE — Patient Instructions (Signed)
Gradually replace the tramadol with oxycodone.  Start with 1 dose of oxycodone per day initially.  Increase to 2 doses a day after 3 days.   You will eventually work up to 4 doses of oxycodone without any tramadol.  Update me as you go along- how much pain relief do you get and how long does the oxycodone last? Continue on miralax- you may need to increase that as you go along.

## 2014-06-05 DIAGNOSIS — G8929 Other chronic pain: Secondary | ICD-10-CM | POA: Insufficient documentation

## 2014-06-05 DIAGNOSIS — M549 Dorsalgia, unspecified: Principal | ICD-10-CM

## 2014-06-05 NOTE — Progress Notes (Signed)
Her back pain continues, along with L sided abd pain that is thought to originate from her back, ie a radicular source.  She has seen uro prev, thought not to have an acute ongoing abd process that would benefit from surgery.  She continues to have B sciatica pain, to the L knee and the R foot.  She has had pain clinic eval prev, with prev back injection w/o sig relief.  She has h/o AMS on higher dose of cymbalta but mentation is wnl now.  She is depressed from the pain.  Tramadol isn't helping.  She has mult med intolerances, but has tolerated oxycodone prev.  I have absolutely no indication of patient ever doing anything untoward with her pain meds.  All d/w pt.  She is on miralax for a bowel regimen.   Meds, vitals, and allergies reviewed.   ROS: See HPI.  Otherwise, noncontributory.  nad In wheelchair, in pain from her back rrr ctab abd soft Ext with 1+ BLE edema noted

## 2014-06-05 NOTE — Assessment & Plan Note (Signed)
At this point, reasonable to start replacement of tramadol with oxycodone, with inc by 5mg  per day every 3rd day.  She agrees.  Caution for GI ADE and change in mentation/sedation.  She'll update me as she goes along. This is the best option I can think of for patient and she agrees.  We talked about then plan in detail. >25 minutes spent in face to face time with patient, >50% spent in counselling or coordination of care.

## 2014-06-08 ENCOUNTER — Telehealth: Payer: Self-pay

## 2014-06-08 NOTE — Telephone Encounter (Signed)
The good news is that she isn't having trouble coming off the tramadol.  I would still gradually replace the tramadol with oxycodone.  If that isn't making a difference by Monday, then update me and we can work on her oxycodone dosing.  I still think that is the safest way to go here, to limit the risk of sedation.  Thanks.

## 2014-06-08 NOTE — Telephone Encounter (Signed)
Pt left v/m; pt called to report with oxycodone that she is not getting a lot of relief from pain. Pt not having problem tapering off tramadol.  06/09/14 will be the 3rd day of second set taking oxycodone. Pt request cb with further instructions.

## 2014-06-08 NOTE — Telephone Encounter (Signed)
Patient advised.

## 2014-06-14 ENCOUNTER — Encounter: Payer: Self-pay | Admitting: Cardiovascular Disease

## 2014-06-14 ENCOUNTER — Ambulatory Visit (INDEPENDENT_AMBULATORY_CARE_PROVIDER_SITE_OTHER): Payer: Medicare Other | Admitting: Cardiovascular Disease

## 2014-06-14 VITALS — BP 140/60 | HR 73 | Ht 61.0 in | Wt 220.0 lb

## 2014-06-14 DIAGNOSIS — J439 Emphysema, unspecified: Secondary | ICD-10-CM | POA: Diagnosis not present

## 2014-06-14 DIAGNOSIS — I10 Essential (primary) hypertension: Secondary | ICD-10-CM | POA: Diagnosis not present

## 2014-06-14 DIAGNOSIS — I4891 Unspecified atrial fibrillation: Secondary | ICD-10-CM

## 2014-06-14 DIAGNOSIS — I739 Peripheral vascular disease, unspecified: Secondary | ICD-10-CM

## 2014-06-14 DIAGNOSIS — E785 Hyperlipidemia, unspecified: Secondary | ICD-10-CM

## 2014-06-14 NOTE — Patient Instructions (Signed)
You are doing well.  Please start the symbicort 2 puffs twice a day Stay on albuterol 2 puffs every 4 to 6 hours put the ventolin away for now  Please call us if you have new issues that need to be addressed before your next appt.  Your physician wants you to follow-up in: 6 months.  You will receive a reminder letter in the mail two months in advance. If you don't receive a letter, please call our office to schedule the follow-up appointment.

## 2014-06-14 NOTE — Assessment & Plan Note (Signed)
Discuss again with her, she does not want any anticoagulation. She understands the risk and benefit of anticoagulation, risk of stroke as she is in chronic atrial fibrillation

## 2014-06-14 NOTE — Assessment & Plan Note (Signed)
Blood pressure is well controlled on today's visit. No changes made to the medications. 

## 2014-06-14 NOTE — Assessment & Plan Note (Signed)
Long history of smoking. Recommended that she start her Symbicort as directed.  Suggested she take either albuterol or Ventolin but not both as she is currently taking. If she continues to have worsening shortness of breath, discussed with her that she might need evaluation by pulmonary with PFTs.

## 2014-06-14 NOTE — Assessment & Plan Note (Signed)
Encouraged her to stay on her Lipitor Goal LDL less than 70 May need to add zetia to reach goal

## 2014-06-14 NOTE — Progress Notes (Signed)
Patient ID: [name], female    DOB: 1931-09-24, 81 y.o.   MRN: [id number]  HPI Comments: [name]  is a 81 year old woman with chronic atrial fibrillation, long smoking history, history of subdural hematoma while on warfarin in 2005, chronic renal insufficiency on diuretics with history of spinal cord injury and neurogenic bladder, peripheral vascular disease with 40-59% left internal carotid arterial disease  who presents for routine followup.  H/o GI stricture, prior history of smoking, stopped many years ago. Chronic pain in her back and legs She was told by a neurosurgeon in 2005 to not be on any blood thinners. In the past has refused aspirin, Plavix and warfarin for her chronic atrial fibrillation  In followup today, she reports that she is having increasing shortness of breath. He did smoke for approximately 50 years. On discussion of her inhalers, she recently received Symbicort but has not started this yet She has been taking 2 puffs of albuterol, 2 puffs of Ventolin in the morning and the evening. She did not know they were the same Weight is up 13 pounds from her prior clinic visit, eating more, sedentary through the winter With this her hemoglobin A1c has climbed Leg weakness, very short of breath using her walker limited in her mobility, uses a scooter She continues to take Lasix 40 mg daily, denies any significant leg edema Oxycodone 5 mg pills are not helping much with her back pain  EKG on today's visit shows atrial fibrillation with ventricular rate 72 bpm, left axis deviation   in the hospital January 2014 with asthma attack. Initially started on diuresis with worsening renal failure Creatinine improved back to baseline by the end of her admission  Previously to the emergency room for mental status changes. Evaluated in the ER 01/06/2013. CT scan of the head showed no stroke. She does report losing vision in her left eye over the course of the past year. She has been  evaluated by ophthalmology. She denies stroke as a cause of her vision loss.   Echocardiogram 02/27/2012 showing normal ejection fraction greater than 55%, moderately dilated left atrium, moderately elevated right ventricular systolic pressure 26-71 mm of mercury   Allergies  Allergen Reactions  . Baclofen Other (See Comments)    Slurred speech and disorientation  . Codeine Anaphylaxis  . Gabapentin Other (See Comments)    Slurred speech and disorientation  . Lisinopril Other (See Comments)    REACTION: stopped because of kidney failure  . Morphine And Related     Altered mentation- can tolerate tramadol   . Warfarin Sodium Anaphylaxis  . Zolpidem Tartrate Other (See Comments)    Slurred speech and disorientation  . Alprazolam Hives  . Amitriptyline Hcl Hives  . Aspirin Nausea And Vomiting  . Celecoxib Nausea Only  . Clonazepam Hives  . Diltiazem Hcl Hives  . Doxepin Hcl Hives  . Fluoxetine Hcl Other (See Comments)    Muscle pains and cramps  . Hydrocodone-Acetaminophen Hives  . Levofloxacin Hives and Itching  . Methadone Hives  . Phenobarbital Other (See Comments)    Causes hyperactivity  . Raspberry Hives  . Rofecoxib Nausea And Vomiting  . Bactrim [Sulfamethoxazole-Trimethoprim] Other (See Comments)  . Sulfa Antibiotics Hives  . Pentazocine Lactate Other (See Comments)  . Spiriva [Tiotropium Bromide Monohydrate] Other (See Comments)    unknown    Outpatient Encounter Prescriptions as of 06/14/2014  Medication Sig  . acetaminophen (TYLENOL) 650 MG CR tablet Take 650 mg by mouth 2 (  two) times daily.   Marland Kitchen albuterol (PROVENTIL HFA;VENTOLIN HFA) 108 (90 BASE) MCG/ACT inhaler Inhale 2 puffs into the lungs every 6 (six) hours as needed for wheezing or shortness of breath.  Marland Kitchen atorvastatin (LIPITOR) 20 MG tablet Take 1 tablet (20 mg total) by mouth daily.  . ATROVENT HFA 17 MCG/ACT inhaler INHALE 2 PUFFS INTO THE LUNGS TWICE A DAY  . budesonide-formoterol (SYMBICORT) 160-4.5  MCG/ACT inhaler Inhale 2 puffs into the lungs 2 (two) times daily.  . digoxin (LANOXIN) 0.125 MG tablet TAKE 1 TABLET DAILY  . DULoxetine (CYMBALTA) 30 MG capsule TAKE 1 CAPSULE DAILY  . fluticasone (FLONASE) 50 MCG/ACT nasal spray Place 2 sprays into both nostrils daily.  . furosemide (LASIX) 40 MG tablet Take 1 tablet (40 mg total) by mouth daily. (Patient taking differently: Take 40 mg by mouth every other day. 80 mg every other day)  . glipiZIDE (GLUCOTROL XL) 2.5 MG 24 hr tablet Take 4 tablets (10 mg total) by mouth daily with breakfast.  . insulin lispro (HUMALOG) 100 UNIT/ML KiwkPen Inject 6 units with a small meal and 8 units with a larger meal (3 times daily).  . Insulin Pen Needle (BD PEN NEEDLE NANO U/F) 32G X 4 MM MISC Use to inject insulin 3 times daily as instructed.  . Insulin Syringe-Needle U-100 (B-D INS SYR ULTRAFINE 1CC/30G) 30G X 1/2" 1 ML MISC Use as directed once daily to administer insulin.  Insulin dependent.  250.00  . LANTUS 100 UNIT/ML injection INJECT 0.2 MLS (20 UNITS TOTAL) INTO THE SKIN AT BEDTIME  . levothyroxine (SYNTHROID, LEVOTHROID) 88 MCG tablet TAKE 1 TABLET DAILY BEFORE BREAKFAST  . linagliptin (TRADJENTA) 5 MG TABS tablet Take 1 tablet (5 mg total) by mouth daily.  Marland Kitchen lisinopril (PRINIVIL,ZESTRIL) 20 MG tablet Take 1 tablet (20 mg total) by mouth 2 (two) times daily.  Marland Kitchen lisinopril (PRINIVIL,ZESTRIL) 20 MG tablet TAKE 1 TABLET TWICE A DAY  . metoprolol succinate (TOPROL-XL) 100 MG 24 hr tablet TAKE 1 TABLET DAILY WITH OR IMMEDIATELY FOLLOWING A MEAL  . nystatin-triamcinolone ointment (MYCOLOG) Apply topically.  Marland Kitchen omeprazole (PRILOSEC) 20 MG capsule TAKE 1 CAPSULE 45 MINUTES PRIOR TO BREAKFAST  . oxyCODONE (ROXICODONE) 5 MG immediate release tablet Take 1 tablet (5 mg total) by mouth 4 (four) times daily as needed for severe pain.  . polyethylene glycol powder (GLYCOLAX/MIRALAX) powder Take 17 g by mouth daily.  . pregabalin (LYRICA) 75 MG capsule Take 1  capsule (75 mg total) by mouth 2 (two) times daily.  Marland Kitchen ROCALTROL 0.5 MCG capsule TAKE 1 CAPSULE DAILY (Patient taking differently: TAKE 1 CAPSULE EVERY OTHER DAY)  . traMADol (ULTRAM) 50 MG tablet Take 1-2 tablets (50-100 mg total) by mouth 3 (three) times daily as needed for severe pain (max 6 per day).  . vitamin B-12 (CYANOCOBALAMIN) 1000 MCG tablet Take 1 tablet (1,000 mcg total) by mouth daily.    Past Medical History  Diagnosis Date  . Asthma   . COPD (chronic obstructive pulmonary disease)   . Diabetes mellitus   . Hypertension   . Thyroid disease   . Hyperlipidemia 10.01.1996  . Atrial fibrillation [id number]  . Subdural hematoma   . Carotid disease, bilateral   . Abdominal hernia   . Colon polyps   . Chronic kidney disease     CKD with baseline Cr 2.7 as of 2/12; right renal mass felt to be benign followed by uro for 3 years w/o sig change, prev bx nondiagnostic  . Obesity   .  Chronic pain syndrome   . Neurogenic bladder disorder   . History of endometriosis   . Polymyalgia rheumatica   . History of gastric ulcer   . GERD (gastroesophageal reflux disease)   . Migraines   . H/O: rheumatic fever   . H. pylori infection     as per 02/04/1994 Hospital Discharge summary  . Passive-dependent personality disorder   . Spinal cord injury   . Renal mass     right, assumed benign  . Gastric polyp   . Fatty liver 2002  . Small bowel obstruction   . Carotid artery disease   . Choledocholithiasis   . Anemia   . Depression   . Blind left eye   . Full dentures   . HOH (hard of hearing)   . Wears glasses     Past Surgical History  Procedure Laterality Date  . Appendectomy  02/25/1948  . Laparoscopic ovarian cystectomy  02/25/1948  . Partial hysterectomy  1960  . Total abdominal hysterectomy  1964  . Laminectomy  1964 &1965  . Cholecystectomy  1973  . Carpal tunnel release  1974    left  . Spinal fusion  [place]  . Ovarian cyst removal  1990  . Abdominal  hernia repair  1994, 1996  . Abdominal wound dehiscence  1994  . Dobutamine stress echo  06/01/1997  . Bladder suspension  1950, 1982  . Goiter removal  1959  . Kidney cystectomy  2010  . Dilation and curettage of uterus    . Craniotomy  03/12/2003    with clot evacuation  . Ercp  11/12/2010    with stone removal and stent placement, stone and stent removed 12/25/10  . Mass excision Left 06/27/2013    Procedure: LEFT THUMB TUMOR EXCISION ;  Surgeon: Jolyn Nap, MD;  Location: [facility];  Service: Orthopedics;  Laterality: Left;  . Trigger finger release Left 06/27/2013    Procedure: LEFT RING FINGER TRIGGER RELEASE;  Surgeon: Jolyn Nap, MD;  Location: [facility];  Service: Orthopedics;  Laterality: Left;    Social History  reports that she quit smoking about 10 years ago. She has never used smokeless tobacco. She reports that she does not drink alcohol or use illicit drugs.  Family History family history includes Atrial fibrillation in her mother; Colon cancer in her mother; Diabetes in her father and sister; Heart attack in her father; Heart disease in her father; Hypertension in her brother, other, and sister; Obesity in her brother; Stroke in her other.   Review of Systems  Constitutional: Negative.   Respiratory: Negative.   Cardiovascular: Negative.   Gastrointestinal: Negative.   Musculoskeletal: Positive for back pain and gait problem.  Skin: Negative.   Allergic/Immunologic: Negative.   Neurological: Negative.   Hematological: Negative.   Psychiatric/Behavioral: Negative.   All other systems reviewed and are negative.   BP 140/60 mmHg  Pulse 73  Ht 5\' 1"  (1.549 m)  Wt 220 lb (99.791 kg)  BMI 41.59 kg/m2  Physical Exam  Constitutional: She is oriented to person, place, and time. She appears well-developed and well-nourished.  Presents in a wheelchair. Obese.  HENT:  Head: Normocephalic.  Nose: Nose normal.  Mouth/Throat:  Oropharynx is clear and moist.  Eyes: Conjunctivae are normal. Pupils are equal, round, and reactive to light.  Neck: Normal range of motion. Neck supple. No JVD present. Carotid bruit is present.  Cardiovascular: Normal rate, S1 normal, S2 normal and  intact distal pulses.  An irregularly irregular rhythm present. Exam reveals no gallop and no friction rub.   Murmur heard.  Crescendo systolic murmur is present with a grade of 2/6  Trace bilateral leg edema  Pulmonary/Chest: Effort normal and breath sounds normal. No respiratory distress. She has no wheezes. She has no rales. She exhibits no tenderness.  Abdominal: Soft. Bowel sounds are normal. She exhibits no distension. There is no tenderness.  Musculoskeletal: Normal range of motion. She exhibits no edema or tenderness.  Lymphadenopathy:    She has no cervical adenopathy.  Neurological: She is alert and oriented to person, place, and time. Coordination normal.  Skin: Skin is warm and dry. No rash noted. No erythema.  Psychiatric: She has a normal mood and affect. Her behavior is normal. Judgment and thought content normal.    Assessment and Plan  Nursing note and vitals reviewed.

## 2014-06-14 NOTE — Assessment & Plan Note (Signed)
bilateral carotid arterial disease. Continue aggressive cholesterol management Carotid disease has been stable

## 2014-06-16 MED ORDER — OXYCODONE HCL 5 MG PO TABS: 5.0000 mg | ORAL_TABLET | Freq: Four times a day (QID) | ORAL | Status: DC | PRN

## 2014-06-16 NOTE — Consult Note (Signed)
Referring Physician:  Benjie Karvonen, Sital :   Primary Care Physician:  Tonia Ghent :   Reason for Consult: Admit Date: 06-Feb-2013  Chief Complaint: Altered mental status.  Reason for Consult: altered mental status   History of Present Illness: History of Present Illness:   79% of the history is gathered from her sister at bedside due to the patient's confusion and AMS.   year old woman with a complex medical history presents with acute on chronic worsening of her cognitive and functional status.  Per sister, patient has become more confused about dates and times.  Her ambulation has gotten worse, has fallen at least 3 times that she is aware of in the last month.  Patient has been having some hallucinations, one where she thought her sister was in bed with her.  She has a nurse who helps care for her for about 20 hours a week.  Patient has been sleeping a lot more lately as well per sister.  Patient says she does not snore at night and feels that she sleeps generally well at night.  Symptoms are moderate in severity.  Constant.  Nothing makes them better or worse. MEDICAL HISTORY: Diastolic heart failure. Her echo in January showed grade 2 diastolic heart failure, EF of 55%.  Diabetes. Neurogenic bladder.  Atrial fibrillation.  History of COPD.  Polymyalgia rheumatica, on chronic steroids . GERD. Hypertension.  Peptic ulcer.  SURGICAL HISTORY: per medical chart:  Subdural hematoma.  Hernia.  Colon polyps.  Spinal fusion.  Bladder tack.  Thyroidectomy.  Cholecystectomy.  Hysterectomy.  Appendectomy.  Ovarian cyst.  Cyst of the left kidney.  HISTORY:  patient lives by herself. Her sister lives close by.  HISTORY:  per medical chart, colon cancer, diabetes, heart disease.  below. ALLERGIES:     ROS:  General denies complaints   HEENT no complaints   Lungs no complaints   Cardiac no complaints   GI no complaints   GU no complaints   Musculoskeletal no complaints   Extremities no  complaints   Skin no complaints   Endocrine no complaints   Psych no complaints   Past Medical/Surgical Hx:  Foot Drop:   Chronic Back Pain:   Neurogenic Bladder:   Obesity:   Diabetes:   Atrial Fibrillation:   Asthma:   Hypertension:   Multiple previous surgeries:   Cholecystectomy:   Laminectomy:   Ventral Hernia Repair: x2 complicated by dehisence  subdural hematoma:   Home Medications: Medication Instructions Last Modified Date/Time  omeprazole 20 mg oral delayed release capsule 1 cap(s) orally once a day (in the morning). *do not crush* 14-Dec-14 19:04  furosemide 80 mg oral tablet 1 tab(s) orally once a day (at bedtime) 14-Dec-14 19:04  Lantus 100 units/mL subcutaneous solution 65 unit(s) subcutaneous once a day before supper. 14-Dec-14 19:04  HumaLOG 100 units/mL subcutaneous solution sliding scale subcutaneous as directed 14-Dec-14 19:04  diphenhydrAMINE 50 mg oral capsule 1 cap(s) orally once a day, As Needed 14-Dec-14 19:04  Procrit use intravenous as needed 14-Dec-14 19:04  Synthroid 112 mcg (0.112 mg) oral tablet 1 tab(s) orally once a day 14-Dec-14 19:04  Atrovent HFA 17 mcg/inh inhalation aerosol 2 puff(s) inhaled 2 times a day 14-Dec-14 19:04  calcitriol 0.5 mcg oral capsule 1 cap(s) orally once a day 14-Dec-14 19:04  Valium 5 mg oral tablet 1 tab(s) orally 4 times a day 14-Dec-14 19:04  Lanoxin 125 mcg (0.125 mg) oral tablet 1 tab(s) orally once a day 14-Dec-14 19:04  Toprol-XL 100 mg oral tablet, extended release 1 tab(s) orally once a day 14-Dec-14 19:04  MiraLax oral powder for reconstitution 17 gram(s) in 8oz of fluid and drink orally once a day 14-Dec-14 19:04  Myrbetriq 25 mg oral tablet, extended release 1 tab(s) orally once a day 14-Dec-14 19:04  Zofran 4 mg oral tablet 1 tab(s) orally every 8 hours, As Needed- for Nausea, Vomiting  14-Dec-14 19:04  promethazine 25 mg oral tablet 1 tab(s) orally every 6 hours, As Needed- for Nausea, Vomiting  14-Dec-14  19:04  Anusol-HC 25 mg rectal suppository 1 suppository(ies) rectal 2 times a day, As Needed for irritation 14-Dec-14 19:04  glycerin adult rectal suppository 1 suppository(ies) rectal once a day, As Needed for irritation 14-Dec-14 19:04  Symbicort 160 mcg-4.5 mcg/inh inhalation aerosol 2 puff(s) inhaled 2 times a day 14-Dec-14 19:04   [facility] Current Meds:   Acetaminophen * tablet, ( Tylenol (325 mg) tablet)  650 mg Oral q4h PRN for pain or temp. greater than 100.4  - Indication: Pain/Fever  Insulin SS -Novolog injection, Subcutaneous, FSBS before meals and at bedtime  0 unit(s) if FSBS 0 - 150     4 unit(s) if FSBS 151 - 200     8 unit(s) if FSBS 201 - 250     12 unit(s) if FSBS 251 - 300     16 unit(s) if FSBS 301 - 350     20 unit(s) if FSBS 351 - 400  Call MD if FSBS greater than 400, [Waste Code: Black]  Ondansetron injection, ( Zofran injection )  4 mg, IV push, q4h PRN for Nausea/Vomiting  Indication: Nausea/ Vomiting  Levothyroxine tablet,  ( Synthroid)  0.088 mg Oral q6am  - Indication: Thyroid Hormone Replacement  Digoxin tablet, ( Lanoxin)  0.125 mg Oral daily  - Indication: Heart Failure/ Atrial Fibrillation and Flutter/ Paroxysmal Atrial Tachycardia  Budesonide-Formoterol 160/4.5 mcg HFAA Inhaler, ( Symbicort 160/4.5 inhaler )  2 puff(s) Inhalation bid  -Indication:asthma  Instructions:  [Waste Code: SendToRx]  Inhaler must be reprimed if it has been dropped or not used in 7 days or more.  Shake well for 5 seconds before each use.  meTOProlol succinate ER tablet, ( Toprol XL)  100 mg Oral daily  - Indication: Antihypertensive/ Angina  Instructions:  - DO NOT CRUSH  Non-Formulary Medication, Myrbetriq 25 mg oral tablet, extended release  25 mg Oral daily  Insulin Detemir injection,  ( Levemir )  50 unit(s), Subcutaneous, at bedtime, policy substitution for lantus  [Waste Code: Black]  Nursing Saline Flush, 3 to 6 ml, IV push, Q1M PRN for IV  Maintenance  Enoxaparin injection, ( Lovenox injection )  40 mg, Subcutaneous, <User Schedule> ( every 1 day: 21:00 )  Indication: Prophylaxis or treatment of thromboembolic disorders, Monitor Anticoags per hospital protocol  Allergies:  Codeine: Anaphylaxis  Cardizem: Hives  Klonopin: Hives  Vicodin: Hives  Talwin Lactate: Hives  Sinequan: Hives  elavil: Hives  Xanax: Hives  Methadone: Hives  Tranxene: Hives  Vioxx: Swelling  Ludiomil: Hives  Baclofen: Other  Neurontin: Other  Phenobarbital: Other  Ambien: Other  Prozac: Other  Coumadin: Other  Celebrex: Unknown  Aspirin: Unknown  Levofloxacin: Unknown  Vital Signs: **Vital Signs.:   15-Dec-14 09:20  Vital Signs Type Pre Medication  Pulse Pulse 56  Pulse source if not from Vital Sign Device apical  Systolic BP Systolic BP 474  Diastolic BP (mmHg) Diastolic BP (mmHg) 61  Mean BP 88   EXAM: GENERAL:  Pleasant woman, in NAD.  Normocephalic and atraumatic.  EYES: Funduscopic exam shows normal disc size, appearance and C/D ratio without clear evidence of papilledema.  CARDIOVASCULAR: S1 and S2 sounds are within normal limits, without murmurs, gallops, or rubs.  MUSCULOSKELETAL: Bulk - Obese Tone - Normal Pronator Drift - Absent bilaterally. Ambulation - Deferred on falls precautions. Romberg - Deferred on falls precautions.  R/L 5/5    Shoulder abduction (deltoid/supraspinatus, axillary/suprascapular n, C5) 5/5    Elbow flexion (biceps brachii, musculoskeletal n, C5-6) 5/5    Elbow extension (triceps, radial n, C7) 5/5    Finger adduction (interossei, ulnar n, T1)   5/5    Hip flexion (iliopsoas, L1/L2) 5/5    Knee flexion (hamstrings, sciatic n, L5/S1) 5/5    Knee extension (quadriceps, femoral n, L3/4) 5/5    Ankle dorsiflexion (tibialis anterior, deep fibular n, L4/5) 5/5    Ankle plantarflexion (gastroc, tibial n, S1)  NEUROLOGICAL: MENTAL STATUS: Patient is oriented to person and place.  Recent  memory is moderate diminished.  Remote memory is within normal limits.  Attention span and concentration are diminished.  Naming, repetition, comprehension and expressive speech are within normal limits.  Patient's fund of knowledge is within normal limits for educational level.  CRANIAL NERVES: Normal    CN II (normal visual acuity and visual fields) Normal    CN III, IV, VI (extraocular muscles are intact) Normal    CN V (facial sensation is intact bilaterally) Normal    CN VII (facial strength is intact bilaterally) Normal    CN VIII (hearing is intact bilaterally) Normal    CN IX/X (palate elevates midline, normal phonation) Normal    CN XI (shoulder shrug strength is normal and symmetric) Normal    CN XII (tongue protrudes midline)   SENSATION: Intact to pain and temp bilaterally (spinothalamic tracts) Intact to position and vibration bilaterally (dorsal columns)   REFLEXES: R/L 2+/2+    Biceps 2+/2+    Brachioradialis   2+/2+    Patellar 2+/2+    Achilles   COORDINATION/CEREBELLAR: Finger to nose testing is within normal limits..  Lab Results:  Thyroid:  14-Dec-14 14:37   Thyroid Stimulating Hormone  0.100 (0.45-4.50 (International Unit)  ----------------------- Pregnant patients have  different reference  ranges for TSH:  - - - - - - - - - -  Pregnant, first trimetser:  0.36 - 2.50 uIU/mL)  Thyroxine, Free  2.21 (Result(s) reported on 06 Feb 2013 at 03:31PM.)  Hepatic:  14-Dec-14 14:37   Bilirubin, Total 0.9  Alkaline Phosphatase 100 (45-117 NOTE: New Reference Range 01/14/13)  SGPT (ALT) 25  SGOT (AST) 32  Total Protein, Serum 6.6  Albumin, Serum  3.0  TDMs:  15-Dec-14 03:58   Digoxin, Serum 1.99 (Therapeutic range for digoxin in patients with atrial fibrillation: 0.8 - 2.0 ng/mL. In patients with congestive heart failure a therapeutic range of 0.5 - 0.8 ng/mL is suggested as higher levels are associated with an increased risk of toxicity without  clear evidence of enhanced efficacy. Digoxin toxicity is commonly associated with serum levels > 2.0 ng/mL but may occur with lower levels, including those in the therapeutic range. Blood samples should be obtained 6-8 hours after administration to assure a reasonable volume of distribution.)  Routine Micro:  14-Dec-14 18:39   Micro Text Report BLOOD CULTURE   COMMENT                   NO GROWTH IN 8-12 HOURS  ANTIBIOTIC                       Culture Comment NO GROWTH IN 8-12 HOURS  Result(s) reported on 07 Feb 2013 at 02:00AM.  Cardiology:  14-Dec-14 14:43   Ventricular Rate 58  Atrial Rate 277  QRS Duration 90  QT 390  QTc 382  R Axis 11  T Axis -4  ECG interpretation Atrial fibrillation with slow ventricular response ST abnormality, possible digitalis effect Abnormal ECG When compared with ECG of 06-Feb-2013 14:43, No significant change was found ----------unconfirmed---------- Confirmed by OVERREAD, NOT ([id number]), editor PEARSON, BARBARA ([id number]) on 02/07/2013 2:16:51 PM  Routine Chem:  14-Dec-14 14:37   B-Type Natriuretic Peptide ([facility])  3001 (Result(s) reported on 06 Feb 2013 at 03:15PM.)  15-Dec-14 03:58   Glucose, Serum 89  BUN  26  Creatinine (comp) 1.18  Sodium, Serum 139  Potassium, Serum 4.0  Chloride, Serum 100  CO2, Serum  33  Calcium (Total), Serum 9.1  Anion Gap  6  Osmolality (calc) 282  eGFR (African American)  51  eGFR (Non-African American)  44 (eGFR values <37m/min/1.73 m2 may be an indication of chronic kidney disease (CKD). Calculated eGFR is useful in patients with stable renal function. The eGFR calculation will not be reliable in acutely ill patients when serum creatinine is changing rapidly. It is not useful in  patients on dialysis. The eGFR calculation may not be applicable to patients at the low and high extremes of body sizes, pregnant women, and vegetarians.)  Cardiac:  14-Dec-14 14:37   CK, Total 46  CPK-MB, Serum 1.7 (Result(s)  reported on 06 Feb 2013 at 03:15PM.)  Troponin I < 0.02 (0.00-0.05 0.05 ng/mL or less: NEGATIVE  Repeat testing in 3-6 hrs  if clinically indicated. >0.05 ng/mL: POTENTIAL  MYOCARDIAL INJURY. Repeat  testing in 3-6 hrs if  clinically indicated. NOTE: An increase or decrease  of 30% or more on serial  testing suggests a  clinically important change)  Routine UA:  14-Dec-14 14:37   Color (UA) Yellow  Clarity (UA) Hazy  Glucose (UA) Negative  Bilirubin (UA) Negative  Ketones (UA) Negative  Specific Gravity (UA) 1.012  Blood (UA) Negative  pH (UA) 5.0  Protein (UA) 100 mg/dL  Nitrite (UA) Negative  Leukocyte Esterase (UA) Negative (Result(s) reported on 06 Feb 2013 at 03:24PM.)  RBC (UA) <1 /HPF  WBC (UA) <1 /HPF  Bacteria (UA) 3+  Epithelial Cells (UA) 1 /HPF (Result(s) reported on 06 Feb 2013 at 03:24PM.)  Routine Hem:  14-Dec-14 14:37   WBC (CBC) 9.6  RBC (CBC) 4.52  Hemoglobin (CBC) 13.2  Hematocrit (CBC) 38.5  Platelet Count (CBC) 230 (Result(s) reported on 06 Feb 2013 at 03:04PM.)  MCV 85  MCH 29.2  MCHC 34.3  RDW 14.3   Radiology Results: CT:    14-Dec-14 16:39, CT Head Without Contrast  CT Head Without Contrast   REASON FOR EXAM:    confusion, AMS.  COMMENTS:       PROCEDURE: CT  - CT HEAD WITHOUT CONTRAST  - Feb 06 2013  4:39PM     CLINICAL DATA:  Confusion, altered mental status    EXAM:  CT HEAD WITHOUT CONTRAST    TECHNIQUE:  Contiguous axial images were obtained from the base of the skull  through the vertex without intravenous contrast.    COMPARISON:  03/04/2012  FINDINGS:  No evidence of parenchymal hemorrhage or extra-axial fluid  collection. No mass lesion,  mass effect, or midline shift.    Apparent high density involving the sulci in the right posterior  frontal lobe are likely artifactual related to overlying hardware  (series 2/image 15).    No CT evidence of acute infarction.    Subcortical white matter and periventricular small  vessel ischemic  changes. Intracranial atherosclerosis.    Mild cortical atrophy.  No ventriculomegaly.  The visualized paranasal sinuses are essentially clear. The mastoid  air cells are unopacified.    Postsurgical changes related to prior right frontal craniotomy. No  evidence of calvarial fracture     IMPRESSION:  No evidence of acute intracranial abnormality.      Electronically Signed    By: Julian Hy M.D.    On: 02/06/2013 17:04       Verified By: Julian Hy, M.D.,   Impression/Recommendations: Recommendations:   81 year old woman with a complex medical history presents with acute on chronic worsening of her cognitive and functional status.   cause for her acute on chronic AMS.  I suspect she has gradually worsening dementia, unclear how much worse it has really gotten recently.  HCT unremarkable.  Labs are unremarkable.  Seems that a prominent feature of her AMS is worsening somnolence.  Suspicion for primary sleep disorder given her obesity, such as OSA.  Further evaluation can be performed with Brain MRI and routine EEG.  If this testing is unrevealing, then mental status evaluation/assessment should be performed in the outpatient setting when patients environs and routines are in order and the patient is not being exposed to the busy, disorienting and confounding environment of the hospital.  have reviewed the results of the most recent imaging studies, tests and labs as outlined above and answered all related questions.  have personally viewed the patient's HCT and it is essentially unremarkable. and coordinated plan of care with hospitalist, Dr. Ulice Bold.    Electronic Signatures: Anabel Bene (MD)  (Signed 16-Dec-14 00:34)  Authored: REFERRING PHYSICIAN, Primary Care Physician, Consult, History of Present Illness, Review of Systems, PAST MEDICAL/SURGICAL HISTORY, HOME MEDICATIONS, Current Medications, ALLERGIES, NURSING VITAL SIGNS, Physical Exam-, LAB  RESULTS, RADIOLOGY RESULTS, Recommendations   Last Updated: 16-Dec-14 00:34 by Anabel Bene (MD)

## 2014-06-16 NOTE — H&P (Signed)
PATIENT NAME:  [name], [name] MR#:  [id number] DATE OF BIRTH:  May 19, 1931  DATE OF ADMISSION:  02/06/2013  PRIMARY CARE PHYSICIAN: Elveria Rising. Damita Dunnings, MD  CHIEF COMPLAINT: Altered mental status.   HISTORY OF PRESENT ILLNESS:  A 81 year old female with a history of diastolic heart failure, neurogenic bladder, chronic pain, hypothyroidism, atrial fibrillation who presents with her sister due to above issues. Over the past year, the patient's sister says that the patient has had progressive altered confusion. Her mental status waxes and wanes. Over the past few weeks especially, this is progressively worse.  She has seen her PCP in the past for this. They recommended going to the ER.  The last time she was here in the ER, they looked at her medications, her mental status cleared so she was sent home. Her sister says that they cannot take care of her anymore. She does live by herself but her mental status has dramatically, over the past few weeks, decreased and she is unable to take care of herself.   REVIEW OF SYSTEMS: Unable to obtain as the patient is confused.   PAST MEDICAL HISTORY: 1.  Diastolic heart failure. Her echo in January showed grade 2 diastolic heart failure, EF of 55%.  2.  Diabetes. 3.  Neurogenic bladder.  4.  Atrial fibrillation.  5.  History of COPD.  6.  Polymyalgia rheumatica, on chronic steroids . 7.  GERD. 8.  Hypertension.  9.  Peptic ulcer.   PAST SURGICAL HISTORY: As per medical chart:  1.  Subdural hematoma.  2.  Hernia.  3.  Colon polyps.  4.  Spinal fusion.  5.  Bladder tack.  6.  Thyroidectomy.  7.  Cholecystectomy.  8.  Hysterectomy.  9.  Appendectomy.  10.  Ovarian cyst.  11.  Cyst of the left kidney.   ALLERGIES: CODEINE, TALWIN, DESYREL, ELAVIL, XANAX, METHADONE, CLONIDINE, CARDIZEM, PROZAC, AMBIEN, PHENOBARBITAL, NEURONTIN, BACLOFEN, ASPIRIN, VIOXX, CELEBREX, COUMADIN, SPIRIVA, PRINIVIL AND LEVAQUIN.   MEDICATIONS: We are in the process of obtaining  medications at this time.   SOCIAL HISTORY:  The patient lives by herself. Her sister lives close by.   FAMILY HISTORY:  As per medical chart, colon cancer, diabetes, heart disease.   PHYSICAL EXAMINATION: VITAL SIGNS: Temperature 97.7, pulse 69, respirations 18, blood pressure 161/69, 95% on room air.  GENERAL: The patient is alert. She is not oriented to place or time.  HEENT: Head is atraumatic. Pupils are round and reactive. Sclerae anicteric. Mucous membranes are moist. Oropharynx is clear.  NECK: Supple without JVD, carotid bruits or enlarged thyroid.  CARDIOVASCULAR: Regular rate and rhythm. No murmurs, gallops or rubs. PMI is hard to palpate.  LUNGS: Clear to auscultation without crackles, rales, rhonchi or wheezing. Normal to percussion. Normal expansion of the chest.   ABDOMEN: Bowel sounds are positive. Nontender, nondistended. No hepatosplenomegaly. EXTREMITIES:  No clubbing, cyanosis or edema.   NEURO:  Hard to do a neuro exam, the patient is not very cooperative.  SKIN: No obvious rashes or lesions.  BACK: No CVA or vertebral tenderness.   LABORATORY AND RADIOLOGICAL DATA:  1.  PH 7.43, pO2 of 70, pCO2 51, this was on room air.  2.  BNP 3001, CK 46, CPK-MB 1.7.  3.  White blood cells 9.6, hemoglobin 13.2, hematocrit 39, platelets 230.  4.  Sodium 138, potassium 4.1, chloride 99, bicarb 36, BUN 31, creatinine 1.34, glucose 157, calcium 9.3, bilirubin 0.9, alk phos 100, ALT 25, AST 33, total protein  6.6, albumin 3.0.  5.  Troponin less than 0.02.  6.  Urinalysis shows no LCE or nitrites.  7.  TSH is 0.100, free T4 is 2.21. 8.  Chest x-ray shows no acute cardiopulmonary disease.  9.  CT of the abdomen and pelvis shows no evidence of PE, question nodular opacities measuring up to 4 mm in the posterior right upper lobe favored to be infectious/inflammatory. No evidence of bowel obstruction. There is an interpolar right renal lesion which was seen in 2012.  10.  CT of the head  shows no acute intracranial hemorrhage or CVA.  11.  CTA of the lungs showed negative PE.   ASSESSMENT AND PLAN:  A 81 year old female who presents with altered mental status/encephalopathy.  1.  Altered mental status with metabolic encephalopathy. The patient may have some cognitive impairment or dementia, or this could be related to her abnormal TSH and elevated transfer.  Her head CT is negative for acute intracranial hemorrhage or cerebrovascular accident.  She has no focal neurological deficits that I can see although the patient is not very cooperative with the exam but she is moving all extremities. For now, I will go ahead and do neuro checks q.4 hours, order a neurology consultation. Continue workup with RPR and vitamin B12 levels. Adjust her Synthroid dose once we know what dose she is on. Further recommendations per neurology,  2.  Acute kidney injury. Her creatinine is 1.3 from her baseline of 1.0.  will provide some gentle hydration and repeat a BMP in the a.m.  3.  Diastolic heart failure with an ejection fraction of greater than 55% in January 2014. The patient seems stable at this time. Once we know her medications, we may resume them.  4.  Abnormal CT scan showing 4 mm right upper lobe nodular opacities. Would consider a followup chest CT in 12 months along with an MRI of the abdomen as mentioned for the abnormal kidney findings.  5.  Hypoxia of unclear etiology. Her CT did not show pulmonary embolism. She has no evidence of pneumonia or infection. She does have a history of asthma but she does not seem to be in any kind of exacerbation at this time. Will continue O2 as needed.  Although the ABG shows she is hypoxic, her pulse oximetry is 95% on room air.  6.  Diabetes. For now, I have put the patient on a diabetic diet. Once we know her medications, we may resume these.  7.  The patient's CODE STATUS is full code.   TIME SPENT: Approximately 50 minutes.      ____________________________ Donell Beers. Benjie Karvonen, MD spm:cs D: 02/06/2013 18:56:25 ET T: 02/06/2013 19:39:09 ET JOB#: [id number]  cc: Britton Bera P. Benjie Karvonen, MD, <Dictator> Elveria Rising. Damita Dunnings, MD Donell Beers Josean Lycan MD ELECTRONICALLY SIGNED 02/06/2013 20:16

## 2014-06-16 NOTE — Telephone Encounter (Signed)
Pt left v/m that oxycodone is working the same as tramadol which is not very good; pt is on 2nd day of 4 pills per day of oxycodone. Pt has enough med to last until 06/19/14. Pt request cb.

## 2014-06-16 NOTE — Discharge Summary (Signed)
PATIENT NAME:  [name], [name] MR#:  [id number] DATE OF BIRTH:  05/25/1931  DATE OF ADMISSION:  02/06/2013 DATE OF DISCHARGE:  02/10/2013  ADMISSION DIAGNOSIS: 1.  Altered mental status.   DISCHARGE DIAGNOSES: 1.  Altered mental status, multifactorial, secondary to enterococcus urinary tract infection, hyperthyroidism, dehydration, possible narcolepsy, possible dementia.  2.  Dehydration with acute renal failure, resolved.  3.  Hyperthyroidism with an elevated T4 on admission and low TSH.  4.  Bradycardia.  5.  Hypertension.  6.  Enterococcus urinary tract infection.   CONSULTATIONS:  Dr. Gurney Maxin.   IMAGING: The patient had an EEG that showed no evidence of seizure-like activity. She had an MRI which essentially showed no acute CVA.   Urine culture was positive for enterococcus. Blood cultures are negative to date.   HOSPITAL COURSE:  A 81 year old female who was brought by her sister for confusion. Apparently, the patient has been very altered for the past year, but especially over the past month. Her sister was very concerned about her, and she was brought in for further evaluation for altered mental status and confusion. For further details, please refer to the H and P.  1.  Altered mental status due to suspected obstructive sleep apnea/hypopnea with possible narcolepsy. The patient was started on Provigil. She also had enterococcus UTI.  She also was on numerous pain medications. Her FT4 was also elevated, so I think this is multifactorial. She actually seems to be doing a lot better after her pain medications had been stopped. She was placed on the Provigil, her Synthroid has been decreased.  Neurology was consulted. She had an MRI, which showed changes from an old subdural hematoma. EEG did not show any evidence of acute seizure-like activity. At this time, she also should be referred to outpatient neurology evaluation for dementia. They did not want to do the workup here, as there  are other factors being in the hospital that could not diagnose this properly, so she will be referred to neurology as an outpatient . 2.  Dehydration with acute renal failure, which has been resolved.  3. Hyperthyroidism. She had a low TSH and high FT4 on admission. We decreased the dose of Synthroid. She will need repeat TFTs in 4 weeks.  4.  Bradycardia. We held her beta blocker. She is continued on her digoxin for atrial fibrillation.  5.  Constipation. She has had bowel movements.  6.  Hypertension, uncontrolled. Was going to add Norvasc, but she has an ALLERGY TO CALCIUM CHANNEL BLOCKERS, so I increased her lisinopril. She will need outpatient followup for this.  7.  Urinary tract infection, Enterococcus faecalis. She is now on Augmentin.  8.  She does see Dr. Jacqlyn Larsen for neurogenic bladder. There is a question whether she should be on chronic Macrobid therapy. This can be discussed with Dr. Jacqlyn Larsen at her next visit.   DISCHARGE MEDICATIONS: 1.  Atrovent 2 puffs b.i.d.  2.  Calcitriol 0.5 mg daily.  3.  Digoxin 0.125 mg daily.  4.  Myrbetriq 25 mg daily.  5.  Symbicort 2 puffs b.i.d.  6.  Omeprazole 20 mg daily.  7.  Lantus 65 units before supper.  8.  Humalog sliding scale insulin.  9.  Lisinopril 10 mg 2 tablets b.i.d.  10.  Tylenol 325, 2 tablets q.4 hours p.r.n. pain.  11.  Provigil 100 mg daily.  12.  Lasix 40 mg daily.  13.  Amoxicillin clavulanate 875 b.i.d. x 10 days.  14.  Synthroid 88 mcg daily, which was decreased from 112 mcg.  15.  Tramadol 50 mg q.8 hours p.r.n. pain.   DISCHARGE DIET: Low-sodium ADA diet with mechanical soft.   DISCHARGE ACTIVITY: As tolerated.   DISCHARGE REFERRAL: Physical therapy.   DISCHARGE FOLLOWUP:  The patient will follow up with [facility] neurology in 2 to 4 weeks and Dr. Damita Dunnings in 4 to 6 weeks for repeat TFTs. Right now, we recommend the patient stay off of any narcotic medications, as this may have caused her altered mental status and confusion. If  she should need narcotics, it should not be scheduled and should be a low dose and on a p.r.n. basis.   The patient is medically stable for discharge.    TIME SPENT: Approximately 40 minutes.   Plan of care was discussed with the patient's sister at bedside.   The patient is DO NOT RESUSCITATE .  ____________________________ Donell Beers. Benjie Karvonen, MD spm:dmm D: 02/10/2013 13:16:32 ET T: 02/10/2013 13:29:35 ET JOB#: [id number]  cc: Colum Colt P. Benjie Karvonen, MD, <Dictator> Elveria Rising. Damita Dunnings, MD Donell Beers Dea Bitting MD ELECTRONICALLY SIGNED 02/10/2013 15:55

## 2014-06-16 NOTE — H&P (Signed)
PATIENT NAME:  [name], [name] MR#:  [id number] DATE OF BIRTH:  11/04/1931  DATE OF ADMISSION:  02/06/2013  ADDENDUM  MEDICATIONS:  We have obtained her medication list as follows:  1.  Digoxin 0.125 mg daily.  2.  Valium 5 mg 4 times a day.  3.  Lantus 65 units at bedtime.  4.  Sliding scale insulin. 6.  Diphenhydramine 50 mg daily as needed.  7.  Promethazine 25 mg q. 6 hours p.r.n.  8.  Zofran 4 mg q. 8 hours p.r.n.  9.  Toprol 100 mg daily.  10.  Atrovent 2 puffs b.i.d.  11.  Symbicort 2 puffs b.i.d.  12.  Anusol 1 suppository b.i.d. p.r.n.  13.  Lasix 80 mg daily.  14.  Procrit as needed.  15.  Glycerin adult rectal suppository daily.  16.  Omeprazole 20 mg daily.  17.  Synthroid 112 mcg daily.  18.  Myrbetriq 25 mg extended release daily for urinary antispasmodic.   19.  Calcitriol 0.5 mcg daily.     ____________________________ [name]. Jill Karvonen, MD spm:mr D: 02/06/2013 19:23:47 ET T: 02/06/2013 19:46:45 ET JOB#: [id number]  cc: Jill Wetherby P. Jill Karvonen, MD, <Dictator> [name] Jill Snedden MD ELECTRONICALLY SIGNED 02/06/2013 20:16

## 2014-06-16 NOTE — H&P (Signed)
PATIENT NAME:  [name], [name] MR#:  [id number] DATE OF BIRTH:  03-27-1931  DATE OF ADMISSION:  02/26/2012  PRIMARY CARE PHYSICIAN: Elveria Rising. Damita Dunnings, MD  CHIEF COMPLAINT: "I can't breathe."   HISTORY OF PRESENT ILLNESS: This is a 81 year old female with multiple medical problems including COPD, atrial fibrillation, diabetes, polymyalgia rheumatica, chronic kidney disease, gastroesophageal reflux disease and hypertension. She presents with shortness of breath going on since last night. She is coughing, but nonproductive. Tightness in her chest. She has had a low-grade fever. In the ER, she was found to be in acute respiratory failure, requiring 4 liters of oxygen to maintain saturation. She was found to have an elevated white count and an elevated BNP. Chest x-ray showed interstitial edema and no pneumonia seen. Hospitalist services were contacted for further evaluation.   PAST MEDICAL HISTORY: Diabetes, neurogenic bladder, atrial fibrillation, COPD, polymyalgia rheumatica, chronic kidney disease stage II, gastroesophageal reflux disease, gastric ulcers, hypertension.   PAST SURGICAL HISTORY: Subdural hematoma, hernia, colon polyps, spinal fusion x 2, bladder tack, laminectomy x 2, thyroidectomy, cholecystectomy, hysterectomy, appendectomy and ovarian cyst, cyst in the left kidney.   ALLERGIES: Multiple medications: CODEINE, TALWIN, DESYREL, ELAVIL, XANAX, METHADONE, KLONOPIN, CARDIZEM, PROZAC, AMBIEN, PHENOBARBITAL, NEURONTIN, BACLOFEN, ASPIRIN, VIOXX, CELEBREX, COUMADIN, SPIRIVA, PRINIVIL and LEVAQUIN.   MEDICATIONS: Include Percocet 10/325 two q.i.d., Valium 5 mg q.i.d., Prevacid 30 mg daily, Lasix 80 mg twice a day, Synthroid 112 mcg daily, Lanoxin 125 mg daily, Toprol 100 mg daily, morphine 15 mg twice a day, Crestor 40 mg daily, MiraLAX 17 grams daily, Lantus 65 units at dinner, Humalog sliding scale, Atrovent inhaler 2 puffs b.i.d., Symbicort 2 puffs b.i.d., calcitriol 0.5 mg daily, Myrbetriq 25  mg daily, Zofran 4 mg p.r.n., Phenergan 25 mg p.r.n., Benadryl 50 mg p.r.n., Anusol suppository p.r.n., glycerin suppositories p.r.n.   SOCIAL HISTORY: Quit smoking 8 years ago. No alcohol. Lives by herself. Used to work as a Biomedical engineer.   FAMILY HISTORY: Mother died of complications of colon cancer. Father died of diabetes and heart disease.   REVIEW OF SYSTEMS: CONSTITUTIONAL: Positive for fever. No chills. No sweats. No weight loss. No weight gain.  EYES: She does wear glasses and has trouble seeing. EARS, NOSE, MOUTH AND THROAT: No hearing loss. No sore throat. No difficulty swallowing.  CARDIOVASCULAR: No chest pain. No palpitations.  RESPIRATORY: Positive for shortness of breath. Positive for cough, nonproductive. No hemoptysis.  GASTROINTESTINAL: Positive for nausea. Positive for abdominal pain. Positive for constipation. No bright red blood per rectum. No melena.  GENITOURINARY: No burning on urination. No hematuria.  MUSCULOSKELETAL: Positive for joint pain.  ENDOCRINE: Positive for hypothyroidism.  PSYCHIATRIC: Positive for anxiety and depression.  NEUROLOGIC: No fainting or blackouts.  INTEGUMENT: No rashes. HEMATOLOGIC, LYMPHATIC: History of anemia.   PHYSICAL EXAMINATION:  VITAL SIGNS: On presentation: Temperature 99.4, pulse 98, respirations 24, blood pressure 191/76, pulse oximetry 91%.  GENERAL: Slight respiratory distress, using accessory muscles to breathe.  EYES: Conjunctivae and lids normal. Pupils equal, round and reactive to light. Extraocular muscles intact. No nystagmus.  EARS, NOSE, MOUTH AND THROAT: Tympanic membrane on the left bulging and erythematous. On the right, no erythema. Nasal mucosa: No erythema. Throat: No erythema. No exudate seen. Lips and gums: No lesions.  NECK: No JVD. No bruits. No lymphadenopathy. No thyromegaly. No thyroid nodules palpated.  RESPIRATORY: Decreased breath sounds bilaterally. Positive rales and rhonchi throughout entire  lung fields.  CARDIOVASCULAR: S1, S2 tachycardic. No gallops, rubs or murmurs heard. Carotid upstroke  2+ bilaterally. No bruits.  EXTREMITIES: Dorsalis pedis pulses 2+ bilaterally, 2+ edema bilateral lower extremity.  ABDOMEN: Soft, nontender. No organomegaly/splenomegaly. Normoactive bowel sounds. No masses felt.  LYMPHATIC: No lymph nodes in the neck.  MUSCULOSKELETAL: 2+ edema, no clubbing, no cyanosis, on oxygen.  SKIN: No rashes or ulcers seen.  NEUROLOGIC: Cranial nerves II through XII grossly intact. Deep tendon reflexes 1/2+ bilateral lower extremities.  PSYCHIATRIC: The patient is oriented to person, place and time.   LABORATORY AND RADIOLOGICAL DATA: EKG shows atrial fib at 95 beats per minute. Urinalysis: 100 mg/dL protein. Chest x-ray showed findings consistent with bronchitis and/or interstitial edema. Venous pH 7.36. BNP 2442. White blood cell count 13.3, hemoglobin and hematocrit 12.8 and 39.9, platelet count 239. Glucose 148, BUN 19, creatinine 0.91, sodium 140, potassium 4.3, chloride 106, CO2 of 28, calcium 8.9. Liver function tests: Alkaline phosphatase elevated at 147, ALT and AST normal range. Albumin 3.2. Troponin negative.   ASSESSMENT AND PLAN:  1.  Acute respiratory failure: Currently on 4 liters of oxygen to oxygenate. The patient wishes to be a Do Not Resuscitate. 2.  Chronic obstructive pulmonary disease exacerbation with systemic inflammatory response syndrome with low-grade temperature, leukocytosis and tachycardia: Will start IV Solu-Medrol 60 mg IV q.6 hours, IV Rocephin and Zithromax. Blood culture sent off x 2. The patient is kept on her usual inhalers of Symbicort and patient given DuoNeb nebulizer solution. Continue to monitor closely for needs for oxygen upon discharge.  3.  Possible congestive heart failure with elevated BNP and chest x-ray concerning for heart failure. Emergency Room physician gave Lasix 120 mg x 1. I will continue 80 mg IV twice daily. The  patient is already on Toprol. Will check an echocardiogram to see whether an angiotensin-converting enzyme inhibitor is needed or not.  4.  Diabetes: Sugars will be elevated while on steroids. Continue Lantus and sliding scale.  5.  Atrial fibrillation, rate controlled on Toprol and Lanoxin, even though slightly tachycardic at this point.  6.  Chronic kidney disease: Creatinine clearance is actually good right now. Would call that stage II.  7.  Polymyalgia rheumatica: The patient does not take any steroids at home.  8.  Gastroesophageal reflux disease: Will put on Protonix. Does take Prevacid at home.  9.  Malignant hypertension: Most likely this is secondary to respiratory failure. Continue to monitor on usual medications and just trend blood pressures and titrate medications if needed.  TIME SPENT ON ADMISSION: 55 minutes.   CODE STATUS: The patient is a DNR.   ____________________________ Tana Conch. Leslye Peer, MD rjw:jm D: 02/26/2012 17:34:16 ET T: 02/26/2012 17:51:06 ET JOB#: [id number]  cc: Tana Conch. Leslye Peer, MD, <Dictator> Elveria Rising. Damita Dunnings, MD Marisue Brooklyn MD ELECTRONICALLY SIGNED 02/26/2012 19:10

## 2014-06-16 NOTE — Telephone Encounter (Signed)
New rx printed, gradually inc to 7.5mg  per dose of oxycodone.  That should help some.   Thanks.

## 2014-06-16 NOTE — Telephone Encounter (Signed)
Message left advising patient and Rx placed up front for pick up. 

## 2014-06-16 NOTE — Discharge Summary (Signed)
PATIENT NAME:  [name], [name] MR#:  [id number] DATE OF BIRTH:  07/22/1931  DATE OF ADMISSION:  02/26/2012 DATE OF DISCHARGE:  03/07/2012  PRIMARY CARE PHYSICIAN:   Elsie Stain, MD.  CHIEF COMPLAINT: Shortness of breath.   DISCHARGE DIAGNOSES: 1.  Acute respiratory failure from asthma/chronic obstructive pulmonary disease exacerbation.  2.  Acute diastolic congestive heart failure.  3.  Delirium, likely from a combination of constipation +/- hypoglycemia.  4.  Atrial fibrillation.  5.  Polymyalgia rheumatica. 6.  Chronic kidney disease. 7.  Diabetes.  8.  Gastroesophageal reflux disease.  9.  Hypertension.  10.  History of chronic obstructive pulmonary disease.  11.  Neurogenic bladder.  12.  History of subdural hematoma.  13. History of spinal fusion surgery x 2, laminectomy x 2, thyroidectomy, cholecystectomy, hysterectomy, appendectomy.   DISCHARGE MEDICATIONS:  Synthroid 112 mcg daily. Crestor 40 mg daily. Atrovent HFA 17 mcg inhaled 2 puffs 2 times a day. Calcitrol 0.5 mcg, 2 cap once a day. Valium 5 mg 4 times daily. Prevacid 30 mg 1 cap once a day. Lasix 80 mg 2 times a day. Lanoxin 125 mcg daily. Toprol-XL 100 mg once a day.  Morphine 15 mg 1 tab 2 times a day as needed for pain. MiraLAX oral powder 17 grams once a day.  Myrbetriq 25 mg extended-release 1 tab once a day. Zofran 4 mg 1 tab every 9 hours as needed for nausea. Promethazine 25 mg once every 6 hours as needed for nausea.  Anusol HC 25 mg suppository 2 times a day as needed for irritation. Glycerin adult suppository once a day as needed. Symbicort 160/4.5 mcg inhaled 2 puffs 2 times a day. Insulin glargine 28 units once a day at bedtime.  Prednisone 10 mg 1 tab once a day for 2 days and stop.  Docusate/senna 50/8.6 mg 1 tab 2 times a day. Humalog sliding scale insulin for blood sugars 150 to 199 times of meals. Please take 2 units if it is 200 to 249, 4 units if it is  250 to 299, please take 6 units, for 300 to 349 please  take 8 units.  If it is over  340 call your PCP.   DISPOSITION: Home.  The patient will be going home with home health, PT and a nurse.   DIET: Low sodium, low fat, low cholesterol, ADA diet.   ACTIVITY: As tolerated.   FOLLOWUP;  Follow with your primary care physician within 1 to 2 weeks and call your PCP for any concerns including high or low blood sugars, shortness of breath, fevers or any other concerns.   HISTORY OF PRESENT ILLNESS: For full details of the H and P, please see the dictation on January 2 of the HPI by Dr. Leslye Peer but briefly, this is a morbidly obese 81 year old female with multiple comorbidities who came in for shortness of breath requiring oxygen to maintain saturation, with elevated BNP and x-ray of the chest showing interstitial edema and was admitted to the hospitalist service for COPD flare and CHF.   SIGNIFICANT LABORATORY AND DIAGNOSTIC DATA:  Initial BUN 64, creatinine 1.42, sodium 132.  The last creatinine 1.09, BUN of 61. Initial glucose 291. CT of the abdomen without contrast showing fecal retention within the colon, likely bilateral renal cysts, indeterminate nodule right kidney.  X-ray of the chest on January 2 showing findings which can be consistent with bronchitis and/or interstitial edema. No effusion or significant cardiomegaly. X-ray of the chest on January 4 showing  findings consistent with slight interstitial infiltrate. most likely pulmonary edema.   HOSPITAL COURSE: The patient was admitted to the hospitalist service and started on steroids, nebulizers and antibiotics for the acute respiratory failure including oxygen. She did have SIRS criteria.  There was leukocytosis but was afebrile. Blood cultures have been negative. Flu test was done which was negative. At this point, the SIRS has resolved and she has been on steroids taper changed from IV steroids to a prednisone taper. She has been weaned off of oxygen and she has had her antibiotics stopped after  about 5 days of antibiotic therapy. She is not short of breath and appears to be at her baseline. This is likely secondary to the asthma/COPD flare. The patient also did have some acute on chronic diastolic CHF. She was started on IV Lasix. She did have a bump in her BUN and therefore, Lasix was held. It was slowly resumed at half a dose p.o. and by discharge, she will be discharged with a full dose, i.e., IV Lasix 80 mg b.i.d. as an outpatient. She does not appear to be volume overloaded now and she is on oxygen nor is she short of breath.   In regards to the diabetes, this was uncontrolled, initially high secondary to being on steroids likely and insulin was adjusted upwards; however, the patient did have hypoglycemic episodes. Initially they were dropped back below her outpatient dose and then held after she had persistent hypoglycemia. Upon further investigations and her conversations with the patient she has been on Lantus and sliding scale regimen as an outpatient but has frequent hypoglycemia including blood sugars as low as 12.  Insulin was started at much lower dose of 20 units and then increased to 28 units.  At this level she does not have any significant hypoglycemia and I would rather her be on the higher side, than on the lower side. The patient did have delirium which was likely secondary to stool retention and severe constipation and did require enema and magnesium citrate. The patient's MiraLAX was continued and she was started on senna.  CT of the head was obtained which was negative for acute events.   After resolution of the constipation her delirium resolved. She is awake, alert, oriented x 3 at this point. Of note, she is on Percocet standing and that will be discontinued given the acetaminophen content and she is on morphine p.o. which will be continued. She is also on Valium for chronic pain and anxiety. Her blood pressure has been stable. Her A. fib has been controlled. She is not on  anticoagulation given the history of subdural hematoma. At this point home health has been arranged for her and she will be discharged with PT and R.N. She will be discharged with lower dose sliding scale insulin than prior as she stated that she used to take out greater than 10 units of insulin if the sugars close to 200 on sliding scale level and she was counseled about the correct way as well as obtaining endocrinologist as an outpatient.   Total Time Spent: 35 minutes.   CODE STATUS:  The patient is DO NOT RESUSCITATE.     ____________________________ Vivien Presto, MD sa:ct D: 03/07/2012 13:43:27 ET T: 03/08/2012 08:07:35 ET JOB#: [id number]  cc: Vivien Presto, MD, <Dictator> Elveria Rising. Damita Dunnings, MD Vivien Presto MD ELECTRONICALLY SIGNED 03/15/2012 20:34

## 2014-06-16 NOTE — Addendum Note (Signed)
Addended by: Tonia Ghent on: 06/16/2014 11:23 AM   Modules accepted: Orders

## 2014-06-19 ENCOUNTER — Other Ambulatory Visit: Payer: Self-pay | Admitting: Family Medicine

## 2014-06-19 NOTE — Telephone Encounter (Signed)
Sent. Thanks.   

## 2014-06-19 NOTE — Telephone Encounter (Signed)
Electronic refill request. Last Filled:    90 capsule 3 RF on 08/02/13  Please advise.

## 2014-06-22 ENCOUNTER — Ambulatory Visit (INDEPENDENT_AMBULATORY_CARE_PROVIDER_SITE_OTHER): Payer: Medicare Other | Admitting: Internal Medicine

## 2014-06-22 ENCOUNTER — Encounter: Payer: Self-pay | Admitting: Internal Medicine

## 2014-06-22 VITALS — BP 120/68 | HR 84 | Temp 97.7°F | Resp 12

## 2014-06-22 DIAGNOSIS — E1165 Type 2 diabetes mellitus with hyperglycemia: Secondary | ICD-10-CM | POA: Diagnosis not present

## 2014-06-22 MED ORDER — INSULIN GLARGINE 100 UNIT/ML ~~LOC~~ SOLN: SUBCUTANEOUS | Status: DC

## 2014-06-22 NOTE — Progress Notes (Signed)
Subjective:     Patient ID: [name], female   DOB: May 25, 1931, 81 y.o.   MRN: [id number]  HPI [name] is a very pleasant 81 y.o. woman, returning for f/u for DM2, dx 1998, insulin-dependent since 1991, controlled, with complications (CKD stage 4 - last GFR 40, peripheral neuropathy, PVD). Last visit 3 mo ago.  Last HbA1C: Lab Results  Component Value Date   HGBA1C 7.4* 05/10/2014   HGBA1C 7.0* 01/27/2014   HGBA1C 6.6* 10/28/2013  Previous HbA1C was 7.2%, but before this, 5.3-6%.   She brings a very detailed, exemplary-kept, CBG log: - before b'fast: 98-145 >> 117-174, most <150 >> 99-160 >> 196-212 >> 179-226 - 2h after b'fast: 122-227 >> 114-200 >> 118x1, 119x1, 182-223 >> 167-224, 264 - before dinner: 107-169 >> 140-185 >> 116 x1, 147-200 >> 119, 147-224 - 2h after dinner: 110-154 (234) >> 127-194 >> 171-200 >> 163-218 >> 185-221 Higher 300s >> 200s  She is on: - Lantus 20 >> 25 units qhs (did not increase to 30 as advised!) - Humalog 11-02-09 - Tradjenta 5 mg before b'fast - Glipizide XL 10 mg  in am. She uses the Lantus but because of arthritis, she had to switch from the pen to the syringes.   Meals: - b'fast: yoghurt 5/7 days - lunch: sandwich + fruit, tangerine - dinner: Lean cuisine + fruit  She has a h/o thyroidectomy and is on replacement. Her last TSH was: Lab Results  Component Value Date   TSH 2.22 05/10/2014   She is on LT4:  - at lunch (did not understand to move the PPI at lunchtime rather than the LT4!) - + PPI in am (moved it in am) - Ca + MVI later in the day  Has CKD. Reviewed BUN/Cr:  BUN  Date Value Ref Range Status  11/25/2013 39* 6 - 23 mg/dL Final  06/27/2013 35* 6 - 23 mg/dL Final  04/25/2013 39* 6 - 23 mg/dL Final  02/10/2013 11 7-18 mg/dL Final  02/07/2013 26* 7-18 mg/dL Final  02/06/2013 31* 7-18 mg/dL Final   CREATININE  Date Value Ref Range Status  02/10/2013 0.97 0.60-1.30 mg/dL Final  02/07/2013 1.18 0.60-1.30 mg/dL Final   02/06/2013 1.34* 0.60-1.30 mg/dL Final   CREATININE, SER  Date Value Ref Range Status  11/25/2013 1.3* 0.4 - 1.2 mg/dL Final  06/27/2013 1.20* 0.50 - 1.10 mg/dL Final  04/25/2013 1.1 0.4 - 1.2 mg/dL Final   She sees her ophthalmologist yearly  >> last exam 02/2014 >> lost vision L eye >> macular degeneration. Had cataract sx both eyes.  She has numbness and tingling in hands and feet.   I reviewed pt's medications, allergies, PMH, social hx, family hx, and changes were documented in the history of present illness. Otherwise, unchanged from my initial visit note.   Review of Systems Constitutional: no weight gain, + fatigue, no subjective hyperthermia/hypothermia, + excessive urination, + poor sleep  Eyes: + blurry vision, no xerophthalmia ENT: no sore throat, no nodules palpated in throat, no dysphagia/odynophagia, no hoarseness, + decreased hearing Cardiovascular: no CP/+ SOB/no palpitations/leg swelling Respiratory: no cough/+ SOB Gastrointestinal: no N/V/D/C Musculoskeletal: + all: muscle/joint aches Skin: no rashes Neurological: no tremors/numbness/tingling/dizziness  Objective:   Physical Exam BP 120/68 mmHg  Pulse 84  Temp(Src) 97.7 F (36.5 C) (Oral)  Resp 12  SpO2 95% patient was not weighed, she was in wheelchair and had a weight checked recently - reviewed  Wt Readings from Last 3 Encounters:  06/14/14 220 lb (  99.791 kg)  06/02/14 223 lb (101.152 kg)  01/27/14 207 lb (93.895 kg)   Constitutional: overweight, in NAD, in wheelchair Eyes: PERRLA, EOMI, no exophthalmos ENT: very dry mucous membranes, no thyromegaly, no cervical lymphadenopathy Cardiovascular: irregularly irregular rhythm, No MRG, arms and leg edema Respiratory: CTA B Gastrointestinal: abdomen soft, NT, ND, BS+ Musculoskeletal: no deformities, strength intact in all 4 Skin: moist, warm, no rashes  Assessment:     1. DM2,insulin-dependent, controlled, with complications - CKD stage 4 -  peripheral neuropathy - PVD  2. Hypothyroidism - post-surgical    Plan:     1. Pt has DM2 with worsening control on Lantus + glipizide XL, Tradjenta, and mealtime insulin. We increased Lantus and moved Tradjenta in am at last visit. She forgot to increase Lantus - I advised her to: Patient Instructions   Please increase Lantus to 30 units at bedtime.  Continue same dose of Humalog: - 9 units before b'fast - 9 units before lunch - 11 units before dinner  Continue: - Tradjenta 5 mg daily in am - Glipizide XL 10 mg daily in am.  Please return in 5 weeks month with your sugar log.   - UTD with eye exams - RTC in 1.5 mo  2. Hypothyroidism - last TSH normal >> reviewed - on 88 mcg LT4, now separated from PPI, but she moved the PPI in am and the LT4 at lunchtime(!) - again discussed to take the thyroid hormone every day, with water, >30 minutes before breakfast, separated by >4 hours from acid reflux medications, calcium, iron, multivitamins.   - will check TFTs at next visit, after moving LT4 in am and PPI at lunch  Patient Instructions   Take the thyroid hormone every day, with water, >30 minutes before breakfast, separated by >4 hours from acid reflux medications, calcium, iron, multivitamins. Please move the Levothyroxine in am and the acid reflux medicine at lunch.

## 2014-06-22 NOTE — Patient Instructions (Signed)
Please increase Lantus to 30 units at bedtime.  Continue same dose of Humalog: - 9 units before b'fast - 9 units before lunch - 11 units before dinner  Continue: - Tradjenta 5 mg daily >> move this to am - Glipizide XL 10 mg daily in am.  Take the thyroid hormone every day, with water, >30 minutes before breakfast, separated by >4 hours from acid reflux medications, calcium, iron, multivitamins. Please move the Levothyroxine in am and the acid reflux medicine at lunch.  Please return in 5 weeks month with your sugar log.

## 2014-07-04 ENCOUNTER — Telehealth: Payer: Self-pay

## 2014-07-04 MED ORDER — OXYCODONE HCL 7.5 MG PO TABS: 1.0000 | ORAL_TABLET | Freq: Four times a day (QID) | ORAL | Status: DC

## 2014-07-04 NOTE — Telephone Encounter (Signed)
Oxycodone is the best option I can think of.  There is a 7.5mg  strength that she wouldn't have to cut.  She can take 1 of those every 6 hours.  I'm fine printing that if she'll agree to it.  To be clear, I don't have other good options.

## 2014-07-04 NOTE — Telephone Encounter (Signed)
Please print the Rx for the 7.5 mg tablet to be picked up by [place] (niece).

## 2014-07-04 NOTE — Telephone Encounter (Signed)
Printed.  Thanks.  

## 2014-07-04 NOTE — Telephone Encounter (Signed)
Pt said when she tries to cut the oxycodone, the tablet disintegrates. The pain is bad but pt does not want to take morphine or any med like that. Pt request cb.

## 2014-07-04 NOTE — Telephone Encounter (Signed)
Rx left at front desk for pick up.

## 2014-07-05 MED ORDER — OXYCODONE-ACETAMINOPHEN 7.5-325 MG PO TABS: 1.0000 | ORAL_TABLET | Freq: Four times a day (QID) | ORAL | Status: DC | PRN

## 2014-07-05 NOTE — Telephone Encounter (Signed)
Pt left v/m; pt was advised by pharmacist that oxycodone 7.5 mg is not available unless is in combination with apap.  Spoke with Judeen Hammans at [facility] and she said oxycodone comes in 5 mg, 10 mg, 15 mg, 20 mg and 30 mg. Pt request cb about what to do about cutting pill in half and the pill goes to powder.

## 2014-07-05 NOTE — Addendum Note (Signed)
Addended by: Tonia Ghent on: 07/05/2014 12:35 PM   Modules accepted: Orders, Medications

## 2014-07-05 NOTE — Telephone Encounter (Signed)
Patient notified that script is up front ready for pickup. Old rx that was printed was shredded and witnessed by Dr. Damita Dunnings.

## 2014-07-05 NOTE — Telephone Encounter (Signed)
Please shred the old rx printed yesterday.

## 2014-07-05 NOTE — Telephone Encounter (Signed)
rx changed and printed.  Will sign when I get to clinic.  Thanks.

## 2014-07-24 ENCOUNTER — Other Ambulatory Visit: Payer: Self-pay | Admitting: Family Medicine

## 2014-07-25 ENCOUNTER — Telehealth: Payer: Self-pay | Admitting: *Deleted

## 2014-07-25 DIAGNOSIS — J449 Chronic obstructive pulmonary disease, unspecified: Secondary | ICD-10-CM

## 2014-07-25 NOTE — Telephone Encounter (Signed)
Pt is calling kind of confused but states that she would like to go and do the referral for respiratory doctor.  She thinks her COPD is not getting better and Dr Rockey Situ suggested she see someone. She just wants [place] to let her know who it was and how does she get an apt with them.

## 2014-07-25 NOTE — Telephone Encounter (Signed)
Referral placed to pulmonology [place].

## 2014-07-28 ENCOUNTER — Telehealth: Payer: Self-pay

## 2014-07-28 MED ORDER — OXYCODONE-ACETAMINOPHEN 7.5-325 MG PO TABS: 1.0000 | ORAL_TABLET | ORAL | Status: DC | PRN

## 2014-07-28 MED ORDER — OXYCODONE-ACETAMINOPHEN 7.5-325 MG PO TABS: 1.0000 | ORAL_TABLET | ORAL | Status: AC | PRN

## 2014-07-28 NOTE — Telephone Encounter (Signed)
Pt left v/m to update how oxycodone apap 7.5 is effecting pt's pain level; pain level is better since on 7.5 mg but pt said does not last the 6 hours in between pain pills. Pt wants to know if can shorten the time can take oxycodone apap 7.5/325 mg  Such as can take 7.5/325 mg q4 h prn instead of increasing the dosage to 10 mg. Pt request [facility] with [facility]. Have not changed instructions on rx until approved by Dr Damita Dunnings . Last rx printed # 120 on 07/05/14; pt has 10 days of med left. Pt request cb today if possible.

## 2014-07-28 NOTE — Telephone Encounter (Signed)
I will be out of clinic next week.  She will likely need an early refill, since she will be taking ~5 pills per day instead of 4.  This will be a legit request on her part.  I upped the # to 150, given my sig change from today I printed the rx to hold for next week, assuming the sig doesn't need to change again.   If the sig needs to change, then please destroy the rx from today.

## 2014-07-28 NOTE — Telephone Encounter (Signed)
I would try taking it every 5 hours instead of 6.  See how that does for the next few days.  Thanks.

## 2014-07-28 NOTE — Telephone Encounter (Signed)
Spoke to pt and advised per Dr Damita Dunnings. Advised pt Rx is not due and must be picked up from office and cannot be faxed. Pt verbally expressed understanding.

## 2014-07-31 NOTE — Telephone Encounter (Signed)
Patient advised.  Rx left at front desk for pick up. 

## 2014-08-01 ENCOUNTER — Encounter: Payer: Self-pay | Admitting: Internal Medicine

## 2014-08-01 ENCOUNTER — Ambulatory Visit (INDEPENDENT_AMBULATORY_CARE_PROVIDER_SITE_OTHER): Payer: Medicare Other | Admitting: Internal Medicine

## 2014-08-01 VITALS — BP 122/72 | HR 73 | Temp 98.4°F | Resp 12

## 2014-08-01 DIAGNOSIS — E1165 Type 2 diabetes mellitus with hyperglycemia: Secondary | ICD-10-CM

## 2014-08-01 DIAGNOSIS — E039 Hypothyroidism, unspecified: Secondary | ICD-10-CM

## 2014-08-01 LAB — T4, FREE: Free T4: 1.04 ng/dL (ref 0.60–1.60)

## 2014-08-01 LAB — HEMOGLOBIN A1C: HEMOGLOBIN A1C: 7.7 % — AB (ref 4.6–6.5)

## 2014-08-01 LAB — TSH: TSH: 2.01 u[IU]/mL (ref 0.35–4.50)

## 2014-08-01 MED ORDER — INSULIN LISPRO 100 UNIT/ML (KWIKPEN): PEN_INJECTOR | SUBCUTANEOUS | Status: AC

## 2014-08-01 MED ORDER — INSULIN GLARGINE 100 UNIT/ML ~~LOC~~ SOLN: SUBCUTANEOUS | Status: AC

## 2014-08-01 NOTE — Progress Notes (Signed)
Subjective:     Patient ID: [name], female   DOB: [birth year], 81 y.o.   MRN: [id number]  HPI [name] is a very pleasant 81 y.o. woman, returning for f/u for DM2, dx 1998, insulin-dependent since 1991, controlled, with complications (CKD stage 4 - last GFR 40, peripheral neuropathy, PVD). Last visit 1.5 mo ago.  Last HbA1C: Lab Results  Component Value Date   HGBA1C 7.4* 05/10/2014   HGBA1C 7.0* 01/27/2014   HGBA1C 6.6* 10/28/2013  Previous HbA1C was 7.2%, but before this, 5.3-6%.   She brings a very detailed, exemplary-kept, CBG log - still high.: - before b'fast: 98-145 >> 117-174, most <150 >> 99-160 >> 196-212 >> 179-226 >> 147-221, 243 - 2h after b'fast: 122-227 >> 114-200 >> 118x1, 119x1, 182-223 >> 167-224, 264 >> 155, 170-223 - before dinner: 107-169 >> 140-185 >> 116 x1, 147-200 >> 119, 147-224 >> 146-220 - 2h after dinner: 110-154 (234) >> 127-194 >> 171-200 >> 163-218 >> 185-221 >> 155-204 Higher 300s >> 200s  She is on: - Lantus 20 >> 25 >> 30 units qhs  - Humalog 9-11 units before a meal - Tradjenta 5 mg before b'fast - Glipizide XL 10 mg  in am. She uses the Lantus but because of arthritis, she had to switch from the pen to the syringes.   Meals: - b'fast: yoghurt 5/7 days - lunch: sandwich + fruit, tangerine - dinner: Lean cuisine + fruit  She has a h/o thyroidectomy and is on replacement. Her last TSH was: Lab Results  Component Value Date   TSH 2.22 05/10/2014   She is on LT4:  - in am - + PPI in am (moved to lunch)  - Ca + MVI later in the day  Has CKD. Reviewed BUN/Cr:  BUN  Date Value Ref Range Status  11/25/2013 39* 6 - 23 mg/dL Final  06/27/2013 35* 6 - 23 mg/dL Final  04/25/2013 39* 6 - 23 mg/dL Final  02/10/2013 11 7-18 mg/dL Final  02/07/2013 26* 7-18 mg/dL Final  02/06/2013 31* 7-18 mg/dL Final   CREATININE  Date Value Ref Range Status  02/10/2013 0.97 0.60-1.30 mg/dL Final  02/07/2013 1.18 0.60-1.30 mg/dL Final  02/06/2013  1.34* 0.60-1.30 mg/dL Final   CREATININE, SER  Date Value Ref Range Status  11/25/2013 1.3* 0.4 - 1.2 mg/dL Final  06/27/2013 1.20* 0.50 - 1.10 mg/dL Final  04/25/2013 1.1 0.4 - 1.2 mg/dL Final   She sees her ophthalmologist yearly  >> last exam 02/2014 >> lost vision L eye >> macular degeneration. Had cataract sx both eyes.  She has numbness and tingling in hands and feet.   I reviewed pt's medications, allergies, PMH, social hx, family hx, and changes were documented in the history of present illness. Otherwise, unchanged from my initial visit note. Started Oxycodone.   Review of Systems Constitutional: + weight gain, + fatigue, no subjective hyperthermia/hypothermia, no excessive urination, + poor sleep  Eyes: + blurry vision, no xerophthalmia ENT: no sore throat, no nodules palpated in throat, no dysphagia/odynophagia, no hoarseness, + decreased hearing Cardiovascular: no CP/+ SOB/no palpitations/leg swelling Respiratory: no cough/+ SOB Gastrointestinal: no N/V/D/C Musculoskeletal: + all: muscle/joint aches Skin: no rashes Neurological: no tremors/numbness/tingling/dizziness  Objective:   Physical Exam BP 122/72 mmHg  Pulse 73  Temp(Src) 98.4 F (36.9 C) (Oral)  Resp 12  SpO2 95% patient was not weighed, she was in wheelchair and had a weight checked recently - reviewed  Wt Readings from Last 3 Encounters:  06/14/14  220 lb (99.791 kg)  06/02/14 223 lb (101.152 kg)  01/27/14 207 lb (93.895 kg)   Constitutional: overweight, in NAD, in wheelchair Eyes: PERRLA, EOMI, no exophthalmos ENT: very dry mucous membranes, no thyromegaly, no cervical lymphadenopathy Cardiovascular: irregularly irregular rhythm, No MRG, arms and leg edema Respiratory: CTA B Gastrointestinal: abdomen soft, NT, ND, BS+ Musculoskeletal: no deformities, strength intact in all 4 Skin: moist, warm, no rashes  Assessment:     1. DM2,insulin-dependent, controlled, with complications - CKD stage 4 -  peripheral neuropathy - PVD  2. Hypothyroidism - post-surgical    Plan:     1. Pt has DM2 with worsening control on Lantus + glipizide XL, Tradjenta, and mealtime insulin. Sugars still uniformly high >> will increase Lantus and Humalog: - I advised her to: Patient Instructions  Please increase: - Lantus to 35 units at bedtime - Humalog to 11-13 units before a meal  Please continue: - Tradjenta 5 mg before b'fast - Glipizide XL 10 mg  in am.  Please stop at the lab.  Please return in 3 months with your sugar log.   - UTD with eye exams - check HbA1c today - RTC in 3 mo  2. Hypothyroidism - last TSH normal >> reviewed  - on 88 mcg LT4, now separated from PPI - again discussed to take the thyroid hormone every day, with water, >30 minutes before breakfast, separated by >4 hours from acid reflux medications, calcium, iron, multivitamins.   - will check TFTs now, after moving LT4 in am and PPI at lunch  Office Visit on 08/01/2014  Component Date Value Ref Range Status  . TSH 08/01/2014 2.01  0.35 - 4.50 uIU/mL Final  . Free T4 08/01/2014 1.04  0.60 - 1.60 ng/dL Final  . Hgb A1c MFr Bld 08/01/2014 7.7* 4.6 - 6.5 % Final   Glycemic Control Guidelines for People with Diabetes:Non Diabetic:  <6%Goal of Therapy: <7%Additional Action Suggested:  >8%    HbA1c worse >> see plan above. TFTs normal.

## 2014-08-01 NOTE — Patient Instructions (Signed)
Please increase: - Lantus to 35 units at bedtime - Humalog to 11-13 units before a meal  Please continue: - Tradjenta 5 mg before b'fast - Glipizide XL 10 mg  in am.  Please stop at the lab.  Please return in 3 months with your sugar log.

## 2014-08-03 ENCOUNTER — Encounter: Payer: Self-pay | Admitting: Internal Medicine

## 2014-08-03 ENCOUNTER — Ambulatory Visit (INDEPENDENT_AMBULATORY_CARE_PROVIDER_SITE_OTHER): Payer: Medicare Other | Admitting: Internal Medicine

## 2014-08-03 VITALS — BP 150/70 | HR 91 | Temp 98.9°F | Ht 62.0 in | Wt 222.0 lb

## 2014-08-03 DIAGNOSIS — J439 Emphysema, unspecified: Secondary | ICD-10-CM

## 2014-08-03 NOTE — Patient Instructions (Addendum)
Follow up with Dr. Stevenson Clinch in 1 month - pulmonary function testing prior to follow up  - continue with your current COPD regiment - please bring your inhalers to your next visit - at your next visit we will discuss getting retested for sleep apnea.

## 2014-08-03 NOTE — Progress Notes (Signed)
Date: 08/03/2014  MRN# [id number] [name] 1931/10/09  Referring Physician:   SHAMIYA DEMERITT is a 81 y.o. old female seen in consultation for COPD optimization  CC:  Chief Complaint  Patient presents with  . Advice Only    COPD consult:pt c/o sob, chest tightness, sl. wheeze and cough.    HPI:   Patient is a pleasant 81 year old female accompanied by her daughter-in-law today for optimization of COPD. Patient states that she's had COPD for several years, diagnosed clinically, her last PFT was done in late 69s. She states her clinical COPD symptoms or shortness of breath, and hoarse voice, not so much coughing. She currently has a walker and uses a mobile cart at times due to back surgery and injury. Collapse episode of bronchitis was about a year and a half ago, she was hospitalized for a week and was given IV steroids and antibiotics. Currently she uses Atrovent and Ventolin, she stopped Symbicort, and she also stopped Spiriva HandiHaler do to "arthritis problems." She previously smoked one pack per day for 50 years, coming towards the end she smoked about 3 packs per day during the last 3 years, and finally quit in 2006. She was previously at a [employer].   PMHX:   Past Medical History  Diagnosis Date  . Asthma   . COPD (chronic obstructive pulmonary disease)   . Diabetes mellitus   . Hypertension   . Thyroid disease   . Hyperlipidemia 10.01.1996  . Atrial fibrillation [id number]  . Subdural hematoma   . Carotid disease, bilateral   . Abdominal hernia   . Colon polyps   . Chronic kidney disease     CKD with baseline Cr 2.7 as of 2/12; right renal mass felt to be benign followed by uro for 3 years w/o sig change, prev bx nondiagnostic  . Obesity   . Chronic pain syndrome   . Neurogenic bladder disorder   . History of endometriosis   . Polymyalgia rheumatica   . History of gastric ulcer   . GERD (gastroesophageal reflux disease)   . Migraines   . H/O:  rheumatic fever   . H. pylori infection     as per 02/04/1994 Hospital Discharge summary  . Passive-dependent personality disorder   . Spinal cord injury   . Renal mass     right, assumed benign  . Gastric polyp   . Fatty liver 2002  . Small bowel obstruction   . Carotid artery disease   . Choledocholithiasis   . Anemia   . Depression   . Blind left eye   . Full dentures   . HOH (hard of hearing)   . Wears glasses    Surgical Hx:  Past Surgical History  Procedure Laterality Date  . Appendectomy  02/25/1948  . Laparoscopic ovarian cystectomy  02/25/1948  . Partial hysterectomy  1960  . Total abdominal hysterectomy  1964  . Laminectomy  1964 &1965  . Cholecystectomy  1973  . Carpal tunnel release  1974    left  . Spinal fusion  [place]  . Ovarian cyst removal  1990  . Abdominal hernia repair  1994, 1996  . Abdominal wound dehiscence  1994  . Dobutamine stress echo  06/01/1997  . Bladder suspension  1950, 1982  . Goiter removal  1959  . Kidney cystectomy  2010  . Dilation and curettage of uterus    . Craniotomy  03/12/2003    with clot evacuation  . Ercp  11/12/2010    with stone removal and stent placement, stone and stent removed 12/25/10  . Mass excision Left 06/27/2013    Procedure: LEFT THUMB TUMOR EXCISION ;  Surgeon: Jolyn Nap, MD;  Location: [facility];  Service: Orthopedics;  Laterality: Left;  . Trigger finger release Left 06/27/2013    Procedure: LEFT RING FINGER TRIGGER RELEASE;  Surgeon: Jolyn Nap, MD;  Location: [facility];  Service: Orthopedics;  Laterality: Left;   Family Hx:  Family History  Problem Relation Age of Onset  . Atrial fibrillation Mother   . Colon cancer Mother   . Heart disease Father   . Heart attack Father   . Diabetes Sister   . Hypertension Sister   . Obesity Brother   . Hypertension Brother   . Hypertension Other   . Diabetes Father   . Stroke Other     Female 1st degree  relative <50   Social Hx:   History  Substance Use Topics  . Smoking status: Former Smoker -- 3.00 packs/day for 60 years    Types: Cigarettes    Quit date: 02/25/2004  . Smokeless tobacco: Never Used  . Alcohol Use: No   Medication:   Current Outpatient Rx  Name  Route  Sig  Dispense  Refill  . acetaminophen (TYLENOL) 650 MG CR tablet   Oral   Take 650 mg by mouth 2 (two) times daily.          Marland Kitchen albuterol (PROVENTIL HFA;VENTOLIN HFA) 108 (90 BASE) MCG/ACT inhaler   Inhalation   Inhale 2 puffs into the lungs every 6 (six) hours as needed for wheezing or shortness of breath.   54 g   3   . atorvastatin (LIPITOR) 20 MG tablet   Oral   Take 1 tablet (20 mg total) by mouth daily.   90 tablet   3   . ATROVENT HFA 17 MCG/ACT inhaler      INHALE 2 PUFFS INTO THE LUNGS TWICE A DAY   3 Inhaler   2   . budesonide-formoterol (SYMBICORT) 160-4.5 MCG/ACT inhaler   Inhalation   Inhale 2 puffs into the lungs 2 (two) times daily.   3 Inhaler   3   . digoxin (LANOXIN) 0.125 MG tablet      TAKE 1 TABLET DAILY   90 tablet   1   . diphenhydrAMINE (BENADRYL) 25 mg capsule   Oral   Take 25 mg by mouth every 6 (six) hours as needed.         . DULoxetine (CYMBALTA) 30 MG capsule      TAKE 1 CAPSULE DAILY   90 capsule   3   . epoetin alfa (EPOGEN,PROCRIT) 16109 UNIT/ML injection   Subcutaneous   Inject 10,000 mLs into the skin as needed.         . fluticasone (FLONASE) 50 MCG/ACT nasal spray   Each Nare   Place 2 sprays into both nostrils daily.   48 g   3   . furosemide (LASIX) 40 MG tablet   Oral   Take 1 tablet (40 mg total) by mouth daily. Patient taking differently: Take 40 mg by mouth every other day. 80 mg every other day   90 tablet   3   . glipiZIDE (GLUCOTROL XL) 2.5 MG 24 hr tablet   Oral   Take 4 tablets (10 mg total) by mouth daily with breakfast.   360 tablet   0   .  insulin glargine (LANTUS) 100 UNIT/ML injection      INJECT 0.35 MLS (35  UNITS TOTAL) INTO THE SKIN AT BEDTIME   3 vial   3   . insulin lispro (HUMALOG) 100 UNIT/ML KiwkPen      Inject under skin11-13 units before a meal (3 times daily).   45 mL   1   . Insulin Pen Needle (BD PEN NEEDLE NANO U/F) 32G X 4 MM MISC      Use to inject insulin 3 times daily as instructed.   300 each   1   . Insulin Syringe-Needle U-100 (B-D INS SYR ULTRAFINE 1CC/30G) 30G X 1/2" 1 ML MISC      Use as directed once daily to administer insulin.  Insulin dependent.  250.00   100 each   3   . levothyroxine (SYNTHROID, LEVOTHROID) 88 MCG tablet      TAKE 1 TABLET DAILY BEFORE BREAKFAST   90 tablet   3   . linagliptin (TRADJENTA) 5 MG TABS tablet   Oral   Take 1 tablet (5 mg total) by mouth daily.   90 tablet   1   . lisinopril (PRINIVIL,ZESTRIL) 20 MG tablet   Oral   Take 1 tablet (20 mg total) by mouth 2 (two) times daily.   60 tablet   1   . lisinopril (PRINIVIL,ZESTRIL) 20 MG tablet      TAKE 1 TABLET TWICE A DAY   180 tablet   2   . metoprolol succinate (TOPROL-XL) 100 MG 24 hr tablet      TAKE 1 TABLET DAILY WITH OR IMMEDIATELY FOLLOWING A MEAL   90 tablet   3   . nystatin-triamcinolone ointment (MYCOLOG)   Topical   Apply topically.         Marland Kitchen omeprazole (PRILOSEC) 20 MG capsule      TAKE 1 CAPSULE 45 MINUTES PRIOR TO BREAKFAST   90 capsule   1   . oxyCODONE-acetaminophen (PERCOCET) 7.5-325 MG per tablet   Oral   Take 1 tablet by mouth every 5 (five) hours as needed for severe pain (fill on/after 08/01/14).   150 tablet   0   . polyethylene glycol powder (GLYCOLAX/MIRALAX) powder      FILL DOSING CUP TO MARKED LINE (17 GRAMS) THEN  MIX IN LIQUID AND DRINK DAILY   1581 g   2   . pregabalin (LYRICA) 75 MG capsule   Oral   Take 1 capsule (75 mg total) by mouth 2 (two) times daily.   180 capsule   1   . promethazine (PHENERGAN) 25 MG tablet   Oral   Take 25 mg by mouth every 6 (six) hours as needed.         Marland Kitchen ROCALTROL 0.5 MCG  capsule      TAKE 1 CAPSULE DAILY   90 capsule   3   . vitamin B-12 (CYANOCOBALAMIN) 1000 MCG tablet   Oral   Take 1 tablet (1,000 mcg total) by mouth daily.             Allergies:  Baclofen; Codeine; Gabapentin; Lisinopril; Morphine and related; Warfarin sodium; Zolpidem tartrate; Alprazolam; Amitriptyline hcl; Aspirin; Celecoxib; Clonazepam; Diltiazem hcl; Doxepin hcl; Fluoxetine hcl; Hydrocodone-acetaminophen; Levofloxacin; Methadone; Phenobarbital; Raspberry; Rofecoxib; Bactrim; Sulfa antibiotics; Pentazocine lactate; and Spiriva  Review of Systems: Gen:  Denies  fever, sweats, chills HEENT: Denies blurred vision, double vision, ear pain, eye pain, hearing loss, nose bleeds, sore throat Cvc:  No dizziness, chest  pain or heaviness Resp:   Denies cough, admits to chronic shortness of breath and hoarse voice Gi: Denies swallowing difficulty, stomach pain, nausea or vomiting, diarrhea, constipation, bowel incontinence Gu:  Denies bladder incontinence, burning urine Ext:   No Joint pain, stiffness or swelling Skin: No skin rash, easy bruising or bleeding or hives Endoc:  No polyuria, polydipsia , polyphagia or weight change Psych: No depression, insomnia or hallucinations  Other:  All other systems negative  Physical Examination:   VS: BP 150/70 mmHg  Pulse 91  Temp(Src) 98.9 F (37.2 C) (Oral)  Ht 5\' 2"  (1.575 m)  Wt 222 lb (100.699 kg)  BMI 40.59 kg/m2  SpO2 92%  General Appearance: No distress  Neuro:without focal findings, mental status, speech normal, alert and oriented, cranial nerves 2-12 intact, reflexes normal and symmetric, sensation grossly normal  HEENT: PERRLA, EOM intact, no ptosis, no other lesions noticed; Mallampati 2 Pulmonary: decent respiratory effort, fine crackles at the bases, otherwise no wheezes.;   Sputum Production:  none CardiovascularNormal S1,S2.  No m/r/g.  Abdominal aorta pulsation normal.    Abdomen: Benign, Soft, non-tender, No masses,  hepatosplenomegaly, No lymphadenopathy Renal:  No costovertebral tenderness  GU:  No performed at this time. Endoc: No evident thyromegaly, no signs of acromegaly or Cushing features Skin:   warm, no rashes, no ecchymosis  Extremities: normal, no cyanosis, clubbing,. +1 edema bilateral lower extremities     Assessment and Plan:-year-old female seen in consultation for COPD optimization. COPD (chronic obstructive pulmonary disease) with emphysema Clinically patient does have a level of COPD. At this time unable to objective vital level based on lack of pulmonary function testing. Ideally I would like her to be on a maintenance medication such as Symbicort/Advair/Spiriva plus a rescue meds. Given her financial status and having some previous arthritis side effects with Spiriva, we will continue with Atrovent and Ventolin until followup visit and repeat pulmonary function test.  Plan: - pulmonary function testing prior to follow up  - continue with current COPD regiment - bring inhalers to next visit - discuss obstructive sleep apnea, risk factors, and testing at next visit    Updated Medication List Outpatient Encounter Prescriptions as of 08/03/2014  Medication Sig  . acetaminophen (TYLENOL) 650 MG CR tablet Take 650 mg by mouth 2 (two) times daily.   Marland Kitchen albuterol (PROVENTIL HFA;VENTOLIN HFA) 108 (90 BASE) MCG/ACT inhaler Inhale 2 puffs into the lungs every 6 (six) hours as needed for wheezing or shortness of breath.  Marland Kitchen atorvastatin (LIPITOR) 20 MG tablet Take 1 tablet (20 mg total) by mouth daily.  . ATROVENT HFA 17 MCG/ACT inhaler INHALE 2 PUFFS INTO THE LUNGS TWICE A DAY  . budesonide-formoterol (SYMBICORT) 160-4.5 MCG/ACT inhaler Inhale 2 puffs into the lungs 2 (two) times daily.  . digoxin (LANOXIN) 0.125 MG tablet TAKE 1 TABLET DAILY  . diphenhydrAMINE (BENADRYL) 25 mg capsule Take 25 mg by mouth every 6 (six) hours as needed.  . DULoxetine (CYMBALTA) 30 MG capsule TAKE 1 CAPSULE  DAILY  . epoetin alfa (EPOGEN,PROCRIT) 20355 UNIT/ML injection Inject 10,000 mLs into the skin as needed.  . fluticasone (FLONASE) 50 MCG/ACT nasal spray Place 2 sprays into both nostrils daily.  . furosemide (LASIX) 40 MG tablet Take 1 tablet (40 mg total) by mouth daily. (Patient taking differently: Take 40 mg by mouth every other day. 80 mg every other day)  . glipiZIDE (GLUCOTROL XL) 2.5 MG 24 hr tablet Take 4 tablets (10 mg total) by mouth daily  with breakfast.  . insulin glargine (LANTUS) 100 UNIT/ML injection INJECT 0.35 MLS (35 UNITS TOTAL) INTO THE SKIN AT BEDTIME  . insulin lispro (HUMALOG) 100 UNIT/ML KiwkPen Inject under skin11-13 units before a meal (3 times daily).  . Insulin Pen Needle (BD PEN NEEDLE NANO U/F) 32G X 4 MM MISC Use to inject insulin 3 times daily as instructed.  . Insulin Syringe-Needle U-100 (B-D INS SYR ULTRAFINE 1CC/30G) 30G X 1/2" 1 ML MISC Use as directed once daily to administer insulin.  Insulin dependent.  250.00  . levothyroxine (SYNTHROID, LEVOTHROID) 88 MCG tablet TAKE 1 TABLET DAILY BEFORE BREAKFAST  . linagliptin (TRADJENTA) 5 MG TABS tablet Take 1 tablet (5 mg total) by mouth daily.  Marland Kitchen lisinopril (PRINIVIL,ZESTRIL) 20 MG tablet Take 1 tablet (20 mg total) by mouth 2 (two) times daily.  Marland Kitchen lisinopril (PRINIVIL,ZESTRIL) 20 MG tablet TAKE 1 TABLET TWICE A DAY  . metoprolol succinate (TOPROL-XL) 100 MG 24 hr tablet TAKE 1 TABLET DAILY WITH OR IMMEDIATELY FOLLOWING A MEAL  . nystatin-triamcinolone ointment (MYCOLOG) Apply topically.  Marland Kitchen omeprazole (PRILOSEC) 20 MG capsule TAKE 1 CAPSULE 45 MINUTES PRIOR TO BREAKFAST  . oxyCODONE-acetaminophen (PERCOCET) 7.5-325 MG per tablet Take 1 tablet by mouth every 5 (five) hours as needed for severe pain (fill on/after 08/01/14).  . polyethylene glycol powder (GLYCOLAX/MIRALAX) powder FILL DOSING CUP TO MARKED LINE (17 GRAMS) THEN  MIX IN LIQUID AND DRINK DAILY  . pregabalin (LYRICA) 75 MG capsule Take 1 capsule (75 mg  total) by mouth 2 (two) times daily.  . promethazine (PHENERGAN) 25 MG tablet Take 25 mg by mouth every 6 (six) hours as needed.  Marland Kitchen ROCALTROL 0.5 MCG capsule TAKE 1 CAPSULE DAILY  . vitamin B-12 (CYANOCOBALAMIN) 1000 MCG tablet Take 1 tablet (1,000 mcg total) by mouth daily.  . [DISCONTINUED] traMADol (ULTRAM) 50 MG tablet Take 1-2 tablets (50-100 mg total) by mouth 3 (three) times daily as needed for severe pain (max 6 per day). (Patient not taking: Reported on 08/01/2014)   No facility-administered encounter medications on file as of 08/03/2014.    Orders for this visit: No orders of the defined types were placed in this encounter.     Thank  you for the consultation and for allowing [facility] to assist in the care of your patient. Our recommendations are noted above.  Please contact us if we can be of further service.   Vilinda Boehringer, MD [facility] Office Number: [phone]

## 2014-08-04 ENCOUNTER — Other Ambulatory Visit: Payer: Self-pay | Admitting: Family Medicine

## 2014-08-07 ENCOUNTER — Telehealth: Payer: Self-pay | Admitting: Family Medicine

## 2014-08-07 ENCOUNTER — Ambulatory Visit: Payer: Medicare Other

## 2014-08-09 ENCOUNTER — Ambulatory Visit: Payer: Medicare Other | Admitting: Internal Medicine

## 2014-08-09 NOTE — Assessment & Plan Note (Signed)
Clinically patient does have a level of COPD. At this time unable to objective vital level based on lack of pulmonary function testing. Ideally I would like her to be on a maintenance medication such as Symbicort/Advair/Spiriva plus a rescue meds. Given her financial status and having some previous arthritis side effects with Spiriva, we will continue with Atrovent and Ventolin until followup visit and repeat pulmonary function test.  Plan: - pulmonary function testing prior to follow up  - continue with current COPD regiment - bring inhalers to next visit - discuss obstructive sleep apnea, risk factors, and testing at next visit

## 2014-08-10 NOTE — Telephone Encounter (Signed)
Called her niece again and offered my condolences.  She thanked me for the call.

## 2014-08-25 NOTE — Telephone Encounter (Signed)
Late entry.  Call this AM from EMS.  Patient found dead.  No sign of foul play.   D/w pt's next of kin via phone.  Offered condolences.   I will sign death certificate.   She was a kind lady and I was always glad to see her.

## 2014-08-25 DEATH — deceased

## 2014-09-04 ENCOUNTER — Encounter: Payer: Medicare Other | Admitting: Internal Medicine

## 2014-09-04 ENCOUNTER — Ambulatory Visit: Payer: Medicare Other | Admitting: Internal Medicine

## 2014-11-07 ENCOUNTER — Ambulatory Visit: Payer: Medicare Other | Admitting: Internal Medicine

## 2015-05-22 IMAGING — CT CT HEAD WITHOUT CONTRAST
2 of 3 series · 16 of 30 positions shown, 18 images · non-contrast
Comparison: 03/04/2012

CLINICAL DATA: Confusion, altered mental status

EXAM:
CT HEAD WITHOUT CONTRAST
TECHNIQUE: Contiguous axial images were obtained from the base of the skull
through the vertex without intravenous contrast.

[Series 2: soft tissue · axial · 0.42mm/px · z∈[-186,-76]mm · 8 of 30 slices shown, 10 images]
[im 4/30  brain]
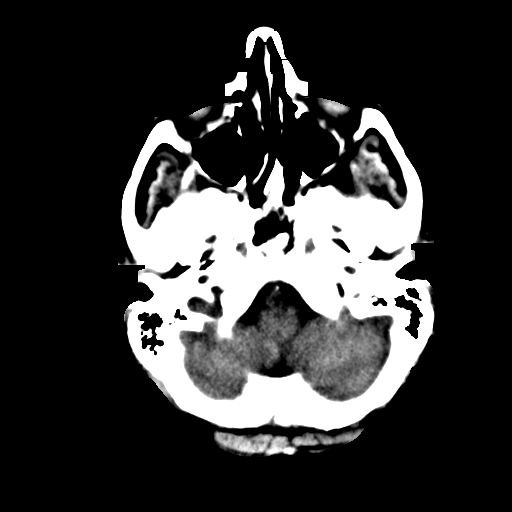
[im 4/30  bone]
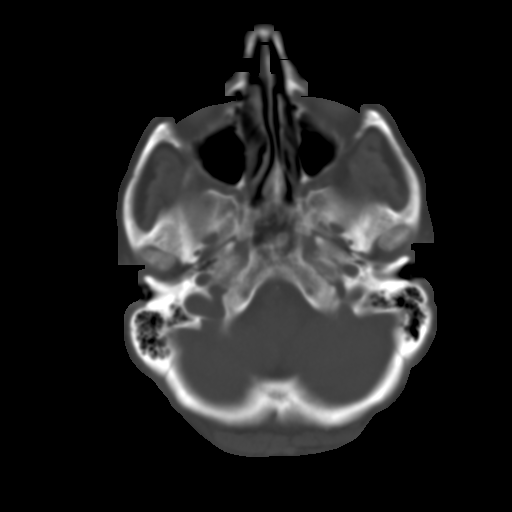
[im 7/30  brain]
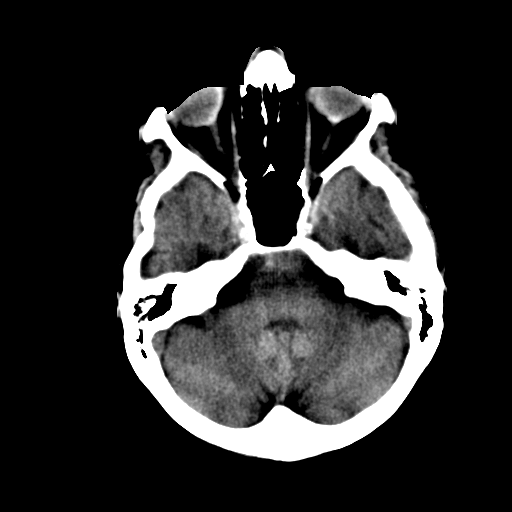
[im 10/30  brain]
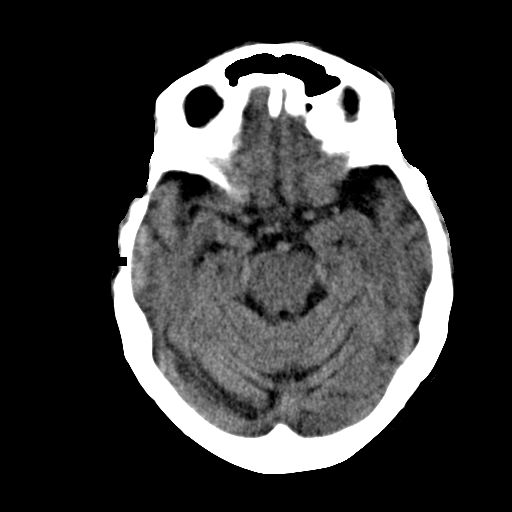
[im 13/30  brain]
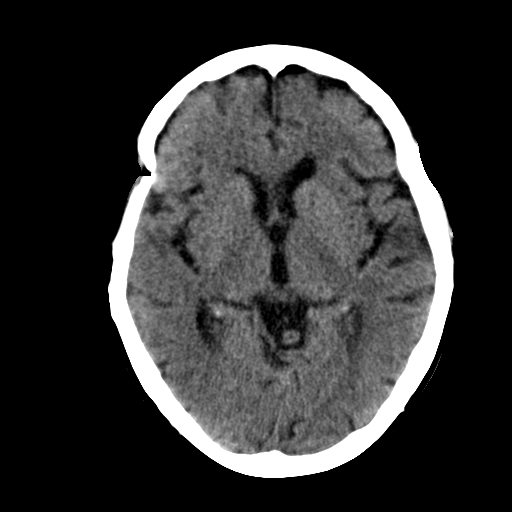
[im 17/30  brain]
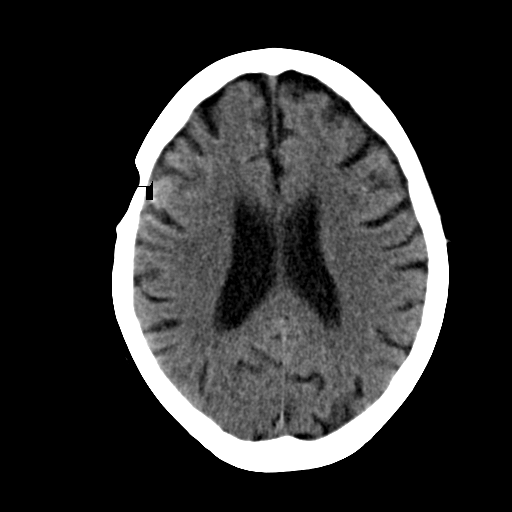
[im 17/30  bone]
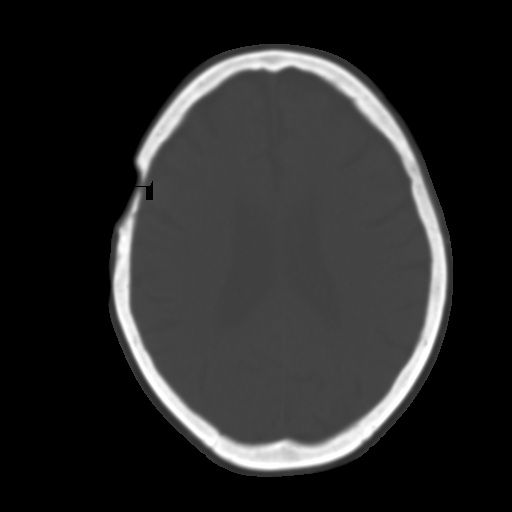
[im 20/30  brain]
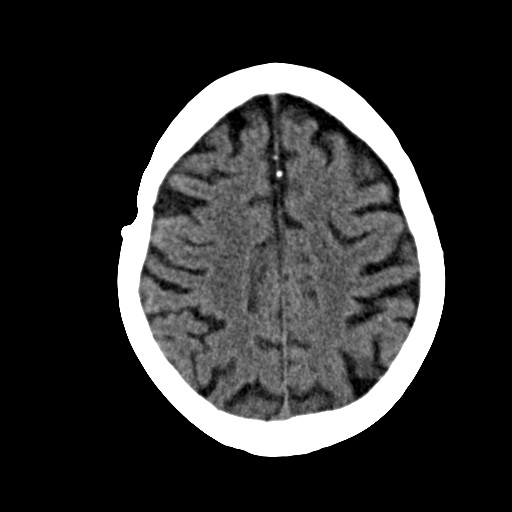
[im 23/30  brain]
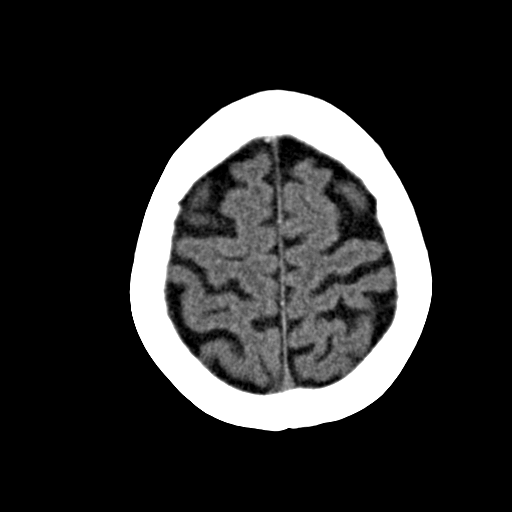
[im 26/30  brain]
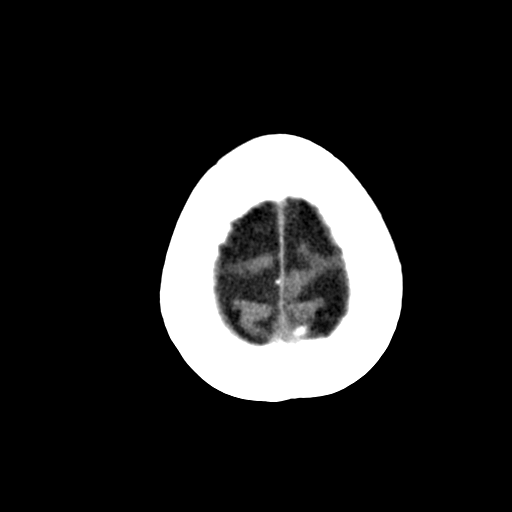

[Series 4: bone recon · axial · 0.42mm/px · z∈[-176,-67]mm · 8 of 30 slices shown]
[im 4/30  bone]
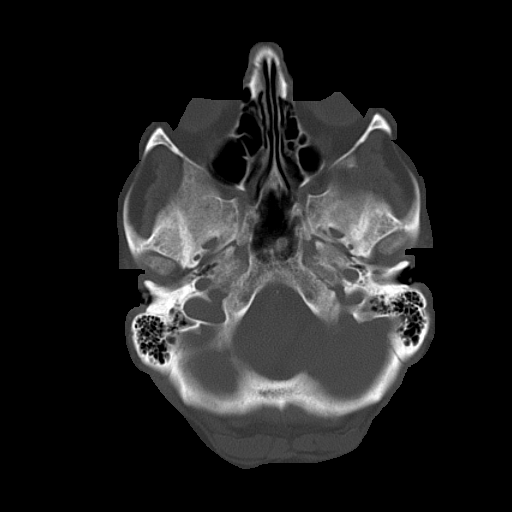
[im 7/30  bone]
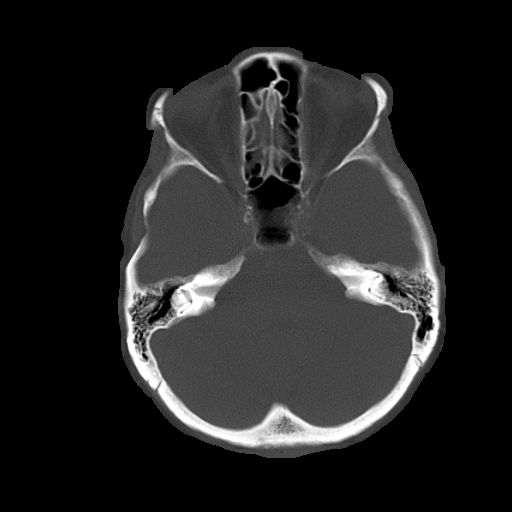
[im 10/30  bone]
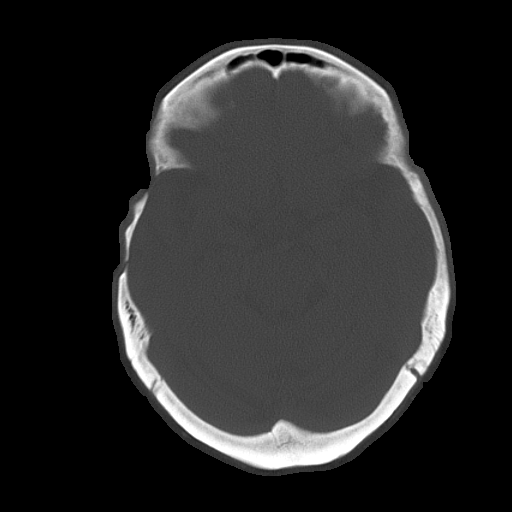
[im 13/30  bone]
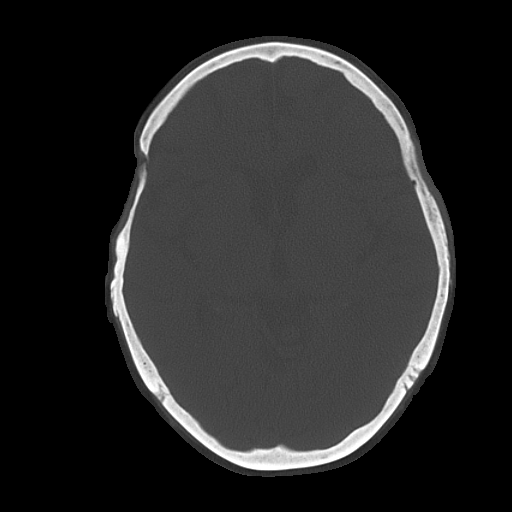
[im 17/30  bone]
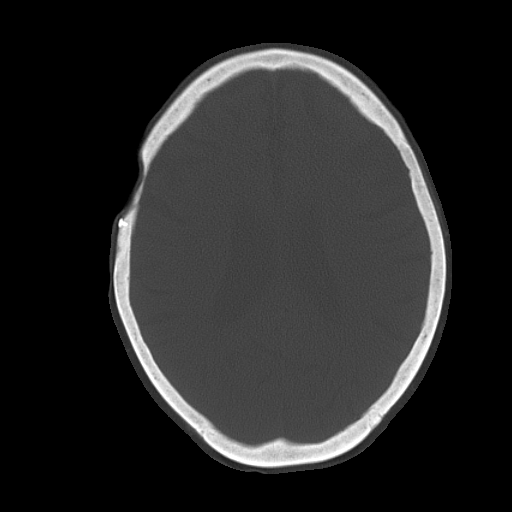
[im 20/30  bone]
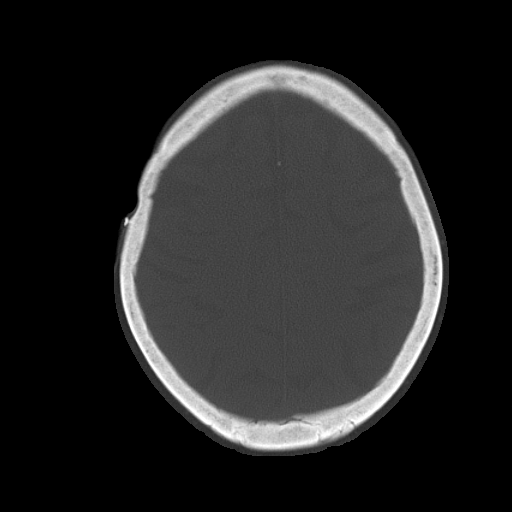
[im 23/30  bone]
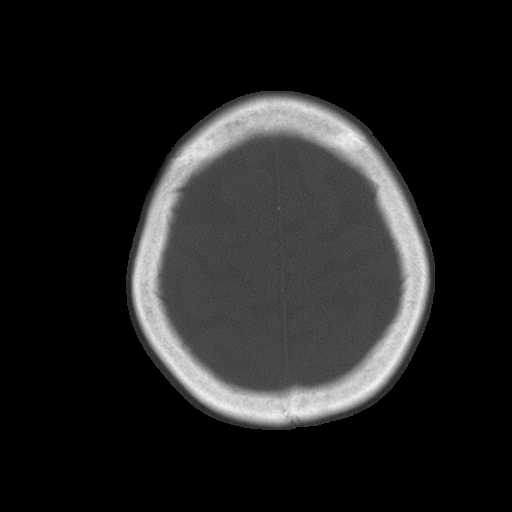
[im 26/30  bone]
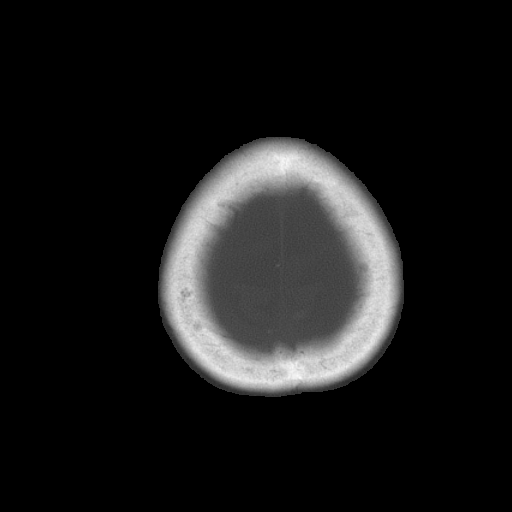

[16 of 30 positions shown; findings below may reference images not displayed]

FINDINGS: No evidence of parenchymal hemorrhage or extra-axial fluid
collection. No mass lesion, mass effect, or midline shift.

Apparent high density involving the sulci in the right posterior
frontal lobe are likely artifactual related to overlying hardware
(series 2/image 15).

No CT evidence of acute infarction.

Subcortical white matter and periventricular small vessel ischemic
changes. Intracranial atherosclerosis.

Mild cortical atrophy.  No ventriculomegaly.

The visualized paranasal sinuses are essentially clear. The mastoid
air cells are unopacified.

Postsurgical changes related to prior right frontal craniotomy. No
evidence of calvarial fracture
IMPRESSION: No evidence of acute intracranial abnormality.
